# Patient Record
Sex: Female | Born: 1985 | Race: Black or African American | Hispanic: No | Marital: Single | Smoking: Never smoker
Health system: Southern US, Community
[De-identification: ages and names within clinical notes are randomized; demographics above are authoritative.]

## PROBLEM LIST (undated history)

## (undated) ENCOUNTER — Inpatient Hospital Stay (HOSPITAL_COMMUNITY): Payer: Self-pay

## (undated) DIAGNOSIS — I10 Essential (primary) hypertension: Secondary | ICD-10-CM

## (undated) DIAGNOSIS — K219 Gastro-esophageal reflux disease without esophagitis: Secondary | ICD-10-CM

## (undated) DIAGNOSIS — Z5189 Encounter for other specified aftercare: Secondary | ICD-10-CM

## (undated) DIAGNOSIS — I509 Heart failure, unspecified: Secondary | ICD-10-CM

## (undated) DIAGNOSIS — D573 Sickle-cell trait: Secondary | ICD-10-CM

## (undated) HISTORY — DX: Encounter for other specified aftercare: Z51.89

## (undated) HISTORY — DX: Essential (primary) hypertension: I10

---
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
## 2007-04-18 ENCOUNTER — Ambulatory Visit: Payer: Self-pay | Admitting: Family Medicine

## 2007-06-04 ENCOUNTER — Ambulatory Visit: Payer: Self-pay | Admitting: Family Medicine

## 2007-06-04 DIAGNOSIS — M79609 Pain in unspecified limb: Secondary | ICD-10-CM | POA: Insufficient documentation

## 2007-06-06 ENCOUNTER — Encounter (INDEPENDENT_AMBULATORY_CARE_PROVIDER_SITE_OTHER): Payer: Self-pay | Admitting: Emergency Medicine

## 2007-06-06 ENCOUNTER — Emergency Department (HOSPITAL_COMMUNITY): Admission: EM | Admit: 2007-06-06 | Discharge: 2007-06-06 | Payer: Self-pay | Admitting: Emergency Medicine

## 2007-06-06 ENCOUNTER — Ambulatory Visit: Payer: Self-pay | Admitting: Surgery

## 2007-07-16 ENCOUNTER — Ambulatory Visit: Payer: Self-pay | Admitting: Family Medicine

## 2007-07-16 DIAGNOSIS — J069 Acute upper respiratory infection, unspecified: Secondary | ICD-10-CM | POA: Insufficient documentation

## 2007-10-02 ENCOUNTER — Emergency Department (HOSPITAL_COMMUNITY): Admission: EM | Admit: 2007-10-02 | Discharge: 2007-10-02 | Payer: Self-pay | Admitting: Emergency Medicine

## 2007-10-11 ENCOUNTER — Emergency Department (HOSPITAL_COMMUNITY): Admission: EM | Admit: 2007-10-11 | Discharge: 2007-10-11 | Payer: Self-pay | Admitting: Family Medicine

## 2007-11-02 ENCOUNTER — Emergency Department (HOSPITAL_COMMUNITY): Admission: EM | Admit: 2007-11-02 | Discharge: 2007-11-02 | Payer: Self-pay | Admitting: Emergency Medicine

## 2007-12-08 ENCOUNTER — Emergency Department (HOSPITAL_COMMUNITY): Admission: EM | Admit: 2007-12-08 | Discharge: 2007-12-08 | Payer: Self-pay | Admitting: Emergency Medicine

## 2009-02-14 ENCOUNTER — Emergency Department (HOSPITAL_COMMUNITY): Admission: EM | Admit: 2009-02-14 | Discharge: 2009-02-14 | Payer: Self-pay | Admitting: Family Medicine

## 2009-05-07 ENCOUNTER — Inpatient Hospital Stay (HOSPITAL_COMMUNITY): Admission: AD | Admit: 2009-05-07 | Discharge: 2009-05-08 | Payer: Self-pay | Admitting: Obstetrics & Gynecology

## 2009-08-04 ENCOUNTER — Emergency Department (HOSPITAL_COMMUNITY): Admission: EM | Admit: 2009-08-04 | Discharge: 2009-08-04 | Payer: Self-pay | Admitting: Emergency Medicine

## 2009-08-18 ENCOUNTER — Ambulatory Visit: Payer: Self-pay | Admitting: Family Medicine

## 2010-04-28 ENCOUNTER — Encounter (INDEPENDENT_AMBULATORY_CARE_PROVIDER_SITE_OTHER): Payer: Self-pay | Admitting: *Deleted

## 2010-10-25 NOTE — Letter (Signed)
Summary: [name] letter  [place] [facility]  [address] [place], [place] [address]   Phone: [phone]  Fax: [phone]       04/28/2010 MRN: [id number]  Suzanne [name] [address] [place], [place]  [address]  Dear Ms. Suzanne [name] [facility] [place], and [place] announce the retirement of Arta Silence, M.D., from full-time practice at the [facility] office effective March 24, 2010 and his plans of returning part-time.  It is important to Dr. Hetty Ely and to our practice that you understand that [facility] has seven physicians in our office for your health care needs.  We will continue to offer the same exceptional care that you have today.    Dr. Hetty Ely has spoken to many of you about his plans for retirement and returning part-time in the fall.   We will continue to work with you through the transition to schedule appointments for you in the office and meet the high standards that [place] is committed to.   Again, it is with great pleasure that we share the news that Dr. Hetty Ely will return to [facility] in October of 2011 with a reduced schedule.    If you have any questions, or would like to request an appointment with one of our physicians, please call us at [phone] and press the option for Scheduling an appointment.  We take pleasure in providing you with excellent patient care and look forward to seeing you at your next office visit.  Our [facility] Physicians are:  Tillman Abide, M.D. Laurita Quint, M.D. Roxy Manns, M.D. Kerby Nora, M.D. Hannah Beat, M.D. Ruthe Mannan, M.D. We proudly welcomed Raechel Ache, M.D. and Eustaquio Boyden, M.D. to the practice in July/August 2011.  Sincerely,   [facility]

## 2010-10-25 NOTE — Assessment & Plan Note (Signed)
Summary: leg pain/rbh   Vital Signs:  Patient Profile:   25 Years Old Female Height:     57.5 inches Weight:      182 pounds Temp:     98.1 degrees F oral Pulse rate:   71 / minute BP sitting:   130 / 80  (left arm) Cuff size:   regular  Vitals Entered By: Cooper Render (June 04, 2007 8:59 AM)                 Chief Complaint:  R) leg pain and bilateral foot pain.  History of Present Illness: Here due t lR leg painx 1wk and both feet--started working [employer], on feet all day--8hqd/5dwk.  Took IBP 800 with no improvement, has also tried Aleve1--no help.  Has Dr Rodolph Bong orthotics.  Wears sneekers to work.  Going to [employer]--early childhood ed--night classes  Current Allergies: No known allergies      Review of Systems      See HPI   Physical Exam  General:     alert, well-developed, well-nourished, well-hydrated, and overweight-appearing.  NAD Head:     normocephalic, atraumatic, and no abnormalities observed.   Lungs:     normal respiratory effort.   Msk:     normal ROM ankles and toes without pain, no pain with palp soles of feet or ankles Extremities:     no edema Neurologic:     alert & oriented X3, sensation intact to light touch, and gait normal.   Skin:     turgor normal, color normal, and no rashes.   Psych:     normally interactive, good eye contact, and hostile.      Impression & Recommendations:  Problem # 1:  FOOT PAIN, BILATERAL (ICD-729.5) see plan encouraged wt off   Complete Medication List: 1)  Zantac Otc  .... As needed 2)  Zyrtec Allergy 10 Mg Tabs (Cetirizine hcl) .... As needed 3)  Relafen 500  .Marland Kitchen.. 1 two times a day as needed pain with food   Patient Instructions: 1)  discussed getting better orthotics 2)  will refer to Podiatrist if not improved in 1 wk.    Prescriptions: RELAFEN 500 1 two times a day as needed pain with food  #60 x 6   Entered and Authorized by:   Gildardo Griffes FNP   Signed by:    Gildardo Griffes FNP on 06/04/2007   Method used:   Print then Give to Patient   RxID:   [id number]

## 2010-10-25 NOTE — Assessment & Plan Note (Signed)
Summary: CPX/CLE   Vital Signs:  Patient Profile:   25 Years Old Female Height:     57.5 inches Weight:      177 pounds Temp:     98.5 degrees F Pulse rate:   106 / minute BP sitting:   100 / 78  (left arm) Cuff size:   regular  Vitals Entered By: Cooper Render (April 18, 2007 2:24 PM)               Chief Complaint:  college pe and ? up to date w/ immunizations.  History of Present Illness: Here to update immunizations for [employer]--plans t take early childhood development.   No daycare experience, has baby sat a lot.  Reviewed immunization record:  needs, Dtap, Gardasil--information given last visit, no decission made.  Current Allergies (reviewed today): No known allergies  Updated/Current Medications (including changes made in today's visit):  * ZANTAC OTC as needed ZYRTEC ALLERGY 10 MG  TABS (CETIRIZINE HCL) as needed   Past Medical History:    Reviewed history from 04/15/2007 and no changes required:       No hx syncopy/fam hx sudden cardiac death, outside MD, had nl CXR-better w/H2 blocker., SS chest pain after exercise-told GERD by     Review of Systems      See HPI   Physical Exam  General:     alert, well-developed, well-nourished, and well-hydrated.   Head:     normocephalic.   Lungs:     normal respiratory effort.   Neurologic:     alert & oriented X3, sensation intact to light touch, and gait normal.   Skin:     turgor normal and color normal.   Psych:     normally interactive.      Impression & Recommendations:  Problem # 1:  VACCINE AGAINST DTP, DTAP (ICD-V06.1)  Orders: Tdap => 5yrs IM (11914) Admin 1st Vaccine (78295)   Medications Added to Medication List This Visit: 1)  Zyrtec Allergy 10 Mg Tabs (Cetirizine hcl) .... As needed   Patient Instructions: 1)  discussed Gardasil again with mother and patient--handout given--will let us know 2)  Clear liquid diet for the next 24 hours, then slowly progress diet as  tolerated.         Tetanus/Td Vaccine    Vaccine Type: Tdap    Site: left deltoid    Mfr: [employer]    Dose: 0.5 ml    Route: IM    Given by: Cooper Render    Exp. Date: 06/12/2009    Lot #: [id number]    VIS given: 04/05/05 version given April 18, 2007.

## 2010-10-25 NOTE — Assessment & Plan Note (Signed)
Summary: cold/allergies   Vital Signs:  Patient Profile:   24 Years Old Female Height:     57.5 inches Weight:      187 pounds Temp:     98.5 degrees F oral Pulse rate:   171 / minute BP sitting:   147 / 129  (left arm) Cuff size:   large  Vitals Entered By: Cooper Render (July 16, 2007 10:16 AM)                 Chief Complaint:  URI sx, ears hurt, and L) eye red and itchy.  History of Present Illness: fHere due to nasal congestion, L eyeitch and ears full for 1wk.  Cold, no fever, cough started today.  Got sent home last wk from wk due to sx.  Back to work now.  Has not missed school.  NOt smoking.  Taking sudafed and benadryl--not much help.  Sx getting worse.  Current Allergies: No known allergies      Review of Systems      See HPI   Physical Exam  General:     alert, well-developed, well-nourished, and overweight-appearing.  NAD Head:     normocephalic, atraumatic, and no abnormalities observed.   Eyes:     pupils equal, pupils round, and no injection.   Ears:     Tms retracted withsome fluid Nose:     boggy and edematus, sinuses +,- Mouth:     injected with no exudate Lungs:     normal respiratory effort, no accessory muscle use, and normal breath sounds.   Neurologic:     alert & oriented X3, sensation intact to light touch, and gait normal.   Skin:     turgor normal, color normal, and no rashes.   Cervical Nodes:     shotty Psych:     normally interactive.      Impression & Recommendations:  Problem # 1:  URI (ICD-465.9) Assessment: New comfort mseasures, tylenol as needed  see back if worsens Her updated medication list for this problem includes:    Zyrtec Allergy 10 Mg Tabs (Cetirizine hcl) .Marland Kitchen... As needed   Problem # 2:  RHINITIS, ALLERGIC (ICD-477.9) Assessment: New  Her updated medication list for this problem includes:    Zyrtec Allergy 10 Mg Tabs (Cetirizine hcl) .Marland Kitchen... As needed    Nasonex 50 Mcg/act Susp (Mometasone  furoate) .Marland Kitchen... 2 sprays each nostril daily for congestion   Complete Medication List: 1)  Zantac Otc  .... As needed 2)  Zyrtec Allergy 10 Mg Tabs (Cetirizine hcl) .... As needed 3)  Relafen 500  .Marland Kitchen.. 1 two times a day as needed pain with food 4)  Nasonex 50 Mcg/act Susp (Mometasone furoate) .... 2 sprays each nostril daily for congestion 5)  Amoxicillin 500 Mg Caps (Amoxicillin) .... 2 two times a day     Prescriptions: ZYRTEC ALLERGY 10 MG  TABS (CETIRIZINE HCL) as needed  #30 x 6   Entered and Authorized by:   Gildardo Griffes FNP   Signed by:   Gildardo Griffes FNP on 07/16/2007   Method used:   Print then Give to Patient   RxID:   [id number] AMOXICILLIN 500 MG  CAPS (AMOXICILLIN) 2 two times a day  #28 x 0   Entered and Authorized by:   Gildardo Griffes FNP   Signed by:   Gildardo Griffes FNP on 07/16/2007   Method used:   Print then Give to Patient   RxID:  [id number] NASONEX 50 MCG/ACT  SUSP (MOMETASONE FUROATE) 2 sprays each nostril daily for congestion  #1 x 6   Entered and Authorized by:   Gildardo Griffes FNP   Signed by:   Gildardo Griffes FNP on 07/16/2007   Method used:   Print then Give to Patient   RxID:   [id number]  ]

## 2011-01-01 LAB — POCT PREGNANCY, URINE: Preg Test, Ur: NEGATIVE

## 2011-01-01 LAB — URINE CULTURE: Colony Count: 70000

## 2011-01-01 LAB — WET PREP, GENITAL
Clue Cells Wet Prep HPF POC: NONE SEEN
Yeast Wet Prep HPF POC: NONE SEEN

## 2011-01-01 LAB — URINALYSIS, ROUTINE W REFLEX MICROSCOPIC
Bilirubin Urine: NEGATIVE
Glucose, UA: NEGATIVE mg/dL
Hgb urine dipstick: NEGATIVE
Ketones, ur: NEGATIVE mg/dL
Nitrite: NEGATIVE
Protein, ur: NEGATIVE mg/dL
Specific Gravity, Urine: 1.02 (ref 1.005–1.030)
Urobilinogen, UA: 0.2 mg/dL (ref 0.0–1.0)
pH: 5.5 (ref 5.0–8.0)

## 2011-01-01 LAB — URINE MICROSCOPIC-ADD ON

## 2011-01-01 LAB — GC/CHLAMYDIA PROBE AMP, GENITAL
Chlamydia, DNA Probe: POSITIVE — AB
GC Probe Amp, Genital: NEGATIVE

## 2011-01-03 LAB — POCT RAPID STREP A (OFFICE): Streptococcus, Group A Screen (Direct): POSITIVE — AB

## 2011-02-07 ENCOUNTER — Inpatient Hospital Stay (INDEPENDENT_AMBULATORY_CARE_PROVIDER_SITE_OTHER)
Admission: RE | Admit: 2011-02-07 | Discharge: 2011-02-07 | Disposition: A | Discharge status: Home / Self Care | Payer: Self-pay | Source: Ambulatory Visit | Attending: Family Medicine | Admitting: Family Medicine

## 2011-02-07 DIAGNOSIS — H109 Unspecified conjunctivitis: Secondary | ICD-10-CM

## 2011-06-16 LAB — RAPID STREP SCREEN (MED CTR MEBANE ONLY): Streptococcus, Group A Screen (Direct): NEGATIVE

## 2012-01-04 ENCOUNTER — Ambulatory Visit: Payer: Self-pay | Admitting: Family Medicine

## 2012-03-27 ENCOUNTER — Encounter (HOSPITAL_COMMUNITY): Payer: Self-pay

## 2012-03-27 ENCOUNTER — Emergency Department (INDEPENDENT_AMBULATORY_CARE_PROVIDER_SITE_OTHER)
Admission: EM | Admit: 2012-03-27 | Discharge: 2012-03-27 | Disposition: A | Discharge status: Home / Self Care | Payer: Self-pay | Source: Home / Self Care | Attending: Family Medicine | Admitting: Family Medicine

## 2012-03-27 DIAGNOSIS — K529 Noninfective gastroenteritis and colitis, unspecified: Secondary | ICD-10-CM

## 2012-03-27 DIAGNOSIS — K5289 Other specified noninfective gastroenteritis and colitis: Secondary | ICD-10-CM

## 2012-03-27 HISTORY — DX: Sickle-cell trait: D57.3

## 2012-03-27 HISTORY — DX: Gastro-esophageal reflux disease without esophagitis: K21.9

## 2012-03-27 MED ORDER — PROMETHAZINE HCL 12.5 MG PO TABS: 12.5000 mg | ORAL_TABLET | Freq: Three times a day (TID) | ORAL | Status: DC | PRN

## 2012-03-27 MED ORDER — LOPERAMIDE HCL 2 MG PO CAPS: 2.0000 mg | ORAL_CAPSULE | Freq: Four times a day (QID) | ORAL | Status: AC | PRN

## 2012-03-27 MED ORDER — ACETAMINOPHEN-CODEINE #3 300-30 MG PO TABS: 1.0000 | ORAL_TABLET | Freq: Four times a day (QID) | ORAL | Status: AC | PRN

## 2012-03-27 NOTE — ED Notes (Signed)
Initial triage was entered on the wrong pts chart, information deleted and then updated.

## 2012-03-27 NOTE — ED Notes (Signed)
C/o generalized abdominal pain, diarrhea and nausea since Monday.  Denies vomiting or fever.  States she was on beach trip with her aunt this weekend and she had these same sx then.  States she has called out from work and needs a work note to return.

## 2012-03-27 NOTE — ED Provider Notes (Signed)
History     CSN: [id number]  Arrival date & time 03/27/12  1116   First MD Initiated Contact with Patient 03/27/12 1133      Chief Complaint  Patient presents with  . Abdominal Pain  . Diarrhea    (Consider location/radiation/quality/duration/timing/severity/associated sxs/prior treatment) HPI Comments: 25 year old female nonsmoker no significant past medical history other than sickle cell trait. Here complaining of generalized abdominal pain, nausea and diarrhea for 2 days. Reports more than 10 runny stools this morning. Denies blood in her stools. No fever or chills. No tenesmus. Abdominal pain is colicky and just before the diarrhea episodes. Had a sick contact ( aunt that traveled with patient to the beach recently had similar symptoms that's already resolved). No rash. No jaundice.  Patient is a 26 y.o. female presenting with diarrhea.  Diarrhea The primary symptoms include abdominal pain, nausea and diarrhea. Primary symptoms do not include fever, vomiting, dysuria or rash.  The illness does not include chills.    Past Medical History  Diagnosis Date  . Sickle cell trait   . Acid reflux     History reviewed. No pertinent past surgical history.  No family history on file.  History  Substance Use Topics  . Smoking status: Never Smoker   . Smokeless tobacco: Not on file  . Alcohol Use: No    OB History    Grav Para Term Preterm Abortions TAB SAB Ect Mult Living                  Review of Systems  Constitutional: Positive for appetite change. Negative for fever and chills.  HENT: Negative for congestion.   Respiratory: Negative for cough and shortness of breath.   Gastrointestinal: Positive for nausea, abdominal pain and diarrhea. Negative for vomiting.  Genitourinary: Negative for dysuria, vaginal bleeding, vaginal discharge and pelvic pain.  Skin: Negative for rash.  Neurological: Negative for dizziness and headaches.    Allergies  Review of patient's  allergies indicates no known allergies.  Home Medications   Current Outpatient Rx  Name Route Sig Dispense Refill  . ACETAMINOPHEN-CODEINE #3 300-30 MG PO TABS Oral Take 1-2 tablets by mouth every 6 (six) hours as needed for pain. 15 tablet 0  . LOPERAMIDE HCL 2 MG PO CAPS Oral Take 1 capsule (2 mg total) by mouth 4 (four) times daily as needed for diarrhea or loose stools. 12 capsule 0  . PROMETHAZINE HCL 12.5 MG PO TABS Oral Take 1 tablet (12.5 mg total) by mouth every 8 (eight) hours as needed for nausea. 15 tablet 0    BP 120/77  Pulse 80  Temp 98.1 F (36.7 C) (Oral)  Resp 16  SpO2 98%  LMP 03/15/2012  Breastfeeding? No  Physical Exam  Nursing note and vitals reviewed. Constitutional: She is oriented to person, place, and time. She appears well-developed and well-nourished. No distress.  HENT:  Head: Normocephalic and atraumatic.  Mouth/Throat: Oropharynx is clear and moist. No oropharyngeal exudate.  Eyes: Conjunctivae are normal. No scleral icterus.  Neck: Neck supple. No thyromegaly present.  Cardiovascular: Normal rate, regular rhythm and normal heart sounds.   Pulmonary/Chest: Effort normal and breath sounds normal. She has no wheezes. She has no rales.  Abdominal: Soft. Bowel sounds are normal. She exhibits no distension and no mass. There is no tenderness. There is no rebound and no guarding.  Lymphadenopathy:    She has no cervical adenopathy.  Neurological: She is alert and oriented to person, place, and time.  Skin: No rash noted.    ED Course  Procedures (including critical care time)  Labs Reviewed - No data to display No results found.   1. Gastroenteritis       MDM  Impress viral gastroenteritis. No clinical dehydration. Tolerating fluids with no vomiting. Treated symptomatically with Phenergan, loperamide and Tylenol No. 3. Asked to return if worsening symptoms like persistent worsening abdominal pain, new onset of fever or not keeping fluids  down.        Sharin Grave, MD 03/28/12 [phone]

## 2012-12-10 ENCOUNTER — Emergency Department (INDEPENDENT_AMBULATORY_CARE_PROVIDER_SITE_OTHER)
Admission: EM | Admit: 2012-12-10 | Discharge: 2012-12-10 | Disposition: A | Discharge status: Home / Self Care | Payer: Self-pay | Source: Home / Self Care

## 2012-12-10 ENCOUNTER — Encounter (HOSPITAL_COMMUNITY): Payer: Self-pay | Admitting: *Deleted

## 2012-12-10 DIAGNOSIS — J45909 Unspecified asthma, uncomplicated: Secondary | ICD-10-CM

## 2012-12-10 DIAGNOSIS — J069 Acute upper respiratory infection, unspecified: Secondary | ICD-10-CM

## 2012-12-10 MED ORDER — ALBUTEROL SULFATE HFA 108 (90 BASE) MCG/ACT IN AERS: 1.0000 | INHALATION_SPRAY | Freq: Four times a day (QID) | RESPIRATORY_TRACT | Status: DC | PRN

## 2012-12-10 MED ORDER — PHENYLEPHRINE-CHLORPHEN-DM 10-4-12.5 MG/5ML PO LIQD: 5.0000 mL | ORAL | Status: DC | PRN

## 2012-12-10 NOTE — ED Provider Notes (Signed)
Medical screening examination/treatment/procedure(s) were performed by resident physician or non-physician practitioner and as supervising physician I was immediately available for consultation/collaboration.   Barkley Bruns MD.   Linna Hoff, MD 12/10/12 1154

## 2012-12-10 NOTE — ED Notes (Signed)
Pt  Reports  Symptoms  Of  ccough  Congested   sorethroat   X  4  Days  Pt  Reports     Symptoms  Not  releived  By otc  meds

## 2012-12-10 NOTE — ED Provider Notes (Signed)
History     CSN: [id number]  Arrival date & time 12/10/12  1001   First MD Initiated Contact with Patient 12/10/12 1014      Chief Complaint  Patient presents with  . Cough    (Consider location/radiation/quality/duration/timing/severity/associated sxs/prior treatment) HPI Comments: 27 year old female presents with PND, minor scratchy throat, sinus changes in voice, cough and wheeze. Denies fever or earache. Denies history of asthma or chronic bronchitis.   Past Medical History  Diagnosis Date  . Sickle cell trait   . Acid reflux     History reviewed. No pertinent past surgical history.  No family history on file.  History  Substance Use Topics  . Smoking status: Never Smoker   . Smokeless tobacco: Not on file  . Alcohol Use: No    OB History   Grav Para Term Preterm Abortions TAB SAB Ect Mult Living                  Review of Systems  Constitutional: Positive for activity change. Negative for fever, chills, appetite change and fatigue.  HENT: Positive for congestion, rhinorrhea and postnasal drip. Negative for ear pain, facial swelling, neck pain, neck stiffness and ear discharge.   Eyes: Negative.   Respiratory: Positive for cough and wheezing. Negative for shortness of breath.   Cardiovascular: Negative.   Gastrointestinal: Negative.   Genitourinary: Negative.   Musculoskeletal: Negative.   Skin: Negative for pallor and rash.  Neurological: Negative.     Allergies  Review of patient's allergies indicates no known allergies.  Home Medications   Current Outpatient Rx  Name  Route  Sig  Dispense  Refill  . albuterol (PROVENTIL HFA;VENTOLIN HFA) 108 (90 BASE) MCG/ACT inhaler   Inhalation   Inhale 1-2 puffs into the lungs every 6 (six) hours as needed for wheezing.   1 Inhaler   0   . Phenylephrine-Chlorphen-DM 06-29-11.5 MG/5ML LIQD   Oral   Take 5 mLs by mouth every 4 (four) hours as needed.   120 mL   0   . EXPIRED: promethazine (PHENERGAN)  12.5 MG tablet   Oral   Take 1 tablet (12.5 mg total) by mouth every 8 (eight) hours as needed for nausea.   15 tablet   0     BP 128/70  Pulse 72  Temp(Src) 98.6 F (37 C) (Oral)  Resp 20  SpO2 100%  LMP 12/03/2012  Physical Exam  Constitutional: She is oriented to person, place, and time. She appears well-developed and well-nourished. No distress.  HENT:  Head: Normocephalic.  Right Ear: External ear normal.  Left Ear: External ear normal.  Postpharyngeal erythema, no exudates. Bilateral TMs are normal  Eyes: Conjunctivae and EOM are normal.  Neck: Normal range of motion. Neck supple.  Cardiovascular: Normal rate and regular rhythm.   Murmur heard. Pulmonary/Chest: Effort normal. No respiratory distress. She has no rales.  Musculoskeletal: Normal range of motion.  Lymphadenopathy:    She has no cervical adenopathy.  Neurological: She is alert and oriented to person, place, and time. No cranial nerve deficit.  Skin: Skin is warm and dry.  Psychiatric: She has a normal mood and affect.    ED Course  Procedures (including critical care time)  Labs Reviewed - No data to display No results found.   1. URI (upper respiratory infection)   2. RAD (reactive airway disease) with wheezing       MDM     Patient is discharged in stable condition. She has  minor wheezes and PND or the primary reasons for her cough. Will prescribe albuterol HFA one to 2 puffs every 4-6 hours when necessary cough and wheeze. Norell CS 1 teaspoon every 4 hours when necessary PND, cough and congestion. Recheck promptly for any new symptoms problems or worsening     Hayden Rasmussen, NP 12/10/12 1041

## 2013-02-13 ENCOUNTER — Encounter (HOSPITAL_COMMUNITY): Payer: Self-pay | Admitting: *Deleted

## 2013-02-13 ENCOUNTER — Inpatient Hospital Stay (HOSPITAL_COMMUNITY)
Admission: AD | Admit: 2013-02-13 | Discharge: 2013-02-13 | Disposition: A | Discharge status: Home / Self Care | Payer: Self-pay | Source: Ambulatory Visit | Attending: Obstetrics & Gynecology | Admitting: Obstetrics & Gynecology

## 2013-02-13 DIAGNOSIS — O210 Mild hyperemesis gravidarum: Secondary | ICD-10-CM | POA: Insufficient documentation

## 2013-02-13 DIAGNOSIS — O219 Vomiting of pregnancy, unspecified: Secondary | ICD-10-CM

## 2013-02-13 LAB — POCT PREGNANCY, URINE: Preg Test, Ur: POSITIVE — AB

## 2013-02-13 LAB — URINALYSIS, ROUTINE W REFLEX MICROSCOPIC
Bilirubin Urine: NEGATIVE
Glucose, UA: NEGATIVE mg/dL
Ketones, ur: NEGATIVE mg/dL
Nitrite: NEGATIVE
Protein, ur: NEGATIVE mg/dL
Specific Gravity, Urine: >1.03 — ABNORMAL HIGH (ref 1.005–1.030)
Urobilinogen, UA: 1 mg/dL (ref 0.0–1.0)
pH: 5.5 (ref 5.0–8.0)

## 2013-02-13 LAB — URINE MICROSCOPIC-ADD ON

## 2013-02-13 MED ORDER — PROMETHAZINE HCL 25 MG PO TABS: 25.0000 mg | ORAL_TABLET | Freq: Three times a day (TID) | ORAL | Status: DC | PRN

## 2013-02-13 NOTE — MAU Note (Signed)
Pt states she some spotting last week. Last regular menstrual period was 01/01/2013.Marland Kitchen Pt complaints of some nausea and vomiting

## 2013-02-13 NOTE — MAU Provider Note (Signed)
History     CSN: [id number]  Arrival date and time: 02/13/13 1455   None     Chief Complaint  Patient presents with  . Possible Pregnancy   HPI 27 y.o. G1P0 at 6.[redacted]weeks EGA with n/v. Patient's last menstrual period was 01/01/2013. Denies pain or bleeding.    Past Medical History  Diagnosis Date  . Sickle cell trait   . Acid reflux     History reviewed. No pertinent past surgical history.  Family History  Problem Relation Age of Onset  . Diabetes Mother   . Arthritis Mother   . Hypertension Mother   . Diabetes Maternal Grandmother   . Hypertension Maternal Grandmother     History  Substance Use Topics  . Smoking status: Never Smoker   . Smokeless tobacco: Not on file  . Alcohol Use: No    Allergies: No Known Allergies  Prescriptions prior to admission  Medication Sig Dispense Refill  . acetaminophen (TYLENOL) 500 MG tablet Take 500 mg by mouth every 6 (six) hours as needed for pain (headache).      Marland Kitchen ibuprofen (ADVIL,MOTRIN) 800 MG tablet Take 800 mg by mouth every 8 (eight) hours as needed for pain (headache and pain in foot).      . [DISCONTINUED] albuterol (PROVENTIL HFA;VENTOLIN HFA) 108 (90 BASE) MCG/ACT inhaler Inhale 1-2 puffs into the lungs every 6 (six) hours as needed for wheezing.  1 Inhaler  0  . [DISCONTINUED] Phenylephrine-Chlorphen-DM 06-29-11.5 MG/5ML LIQD Take 5 mLs by mouth every 4 (four) hours as needed.  120 mL  0  . [DISCONTINUED] promethazine (PHENERGAN) 12.5 MG tablet Take 1 tablet (12.5 mg total) by mouth every 8 (eight) hours as needed for nausea.  15 tablet  0    Review of Systems  Constitutional: Negative.   Respiratory: Negative.   Cardiovascular: Negative.   Gastrointestinal: Positive for nausea and vomiting. Negative for abdominal pain, diarrhea and constipation.  Genitourinary: Negative for dysuria, urgency, frequency, hematuria and flank pain.       Negative for vaginal bleeding, vaginal discharge, dyspareunia  Musculoskeletal:  Negative.   Neurological: Negative.   Psychiatric/Behavioral: Negative.    Physical Exam   Blood pressure 120/79, pulse 98, temperature 98.3 F (36.8 C), temperature source Oral, resp. rate 18, last menstrual period 01/01/2013.  Physical Exam  Nursing note and vitals reviewed. Constitutional: She is oriented to person, place, and time. She appears well-developed and well-nourished. No distress.  Cardiovascular: Normal rate.   Respiratory: Effort normal.  GI: Soft. There is no tenderness.  Neurological: She is alert and oriented to person, place, and time.  Skin: Skin is warm and dry.  Psychiatric: She has a normal mood and affect.    MAU Course  Procedures Results for orders placed during the hospital encounter of 02/13/13 (from the past 24 hour(s))  URINALYSIS, ROUTINE W REFLEX MICROSCOPIC     Status: Abnormal   Collection Time    02/13/13  3:10 PM      Result Value Range   Color, Urine YELLOW  YELLOW   APPearance CLEAR  CLEAR   Specific Gravity, Urine >1.030 (*) 1.005 - 1.030   pH 5.5  5.0 - 8.0   Glucose, UA NEGATIVE  NEGATIVE mg/dL   Hgb urine dipstick TRACE (*) NEGATIVE   Bilirubin Urine NEGATIVE  NEGATIVE   Ketones, ur NEGATIVE  NEGATIVE mg/dL   Protein, ur NEGATIVE  NEGATIVE mg/dL   Urobilinogen, UA 1.0  0.0 - 1.0 mg/dL   Nitrite NEGATIVE  NEGATIVE   Leukocytes, UA SMALL (*) NEGATIVE  URINE MICROSCOPIC-ADD ON     Status: Abnormal   Collection Time    02/13/13  3:10 PM      Result Value Range   Squamous Epithelial / LPF MANY (*) RARE   WBC, UA 3-6  <3 WBC/hpf   RBC / HPF 0-2  <3 RBC/hpf  POCT PREGNANCY, URINE     Status: Abnormal   Collection Time    02/13/13  3:15 PM      Result Value Range   Preg Test, Ur POSITIVE (*) NEGATIVE     Assessment and Plan   1. Nausea and vomiting in pregnancy prior to [redacted] weeks gestation   Meds as below, may also try Unisom and B6, pregnancy verification and list of providers given    Medication List    STOP taking  these medications       albuterol 108 (90 BASE) MCG/ACT inhaler  Commonly known as:  PROVENTIL HFA;VENTOLIN HFA     Phenylephrine-Chlorphen-DM 06-29-11.5 MG/5ML Liqd      TAKE these medications       acetaminophen 500 MG tablet  Commonly known as:  TYLENOL  Take 500 mg by mouth every 6 (six) hours as needed for pain (headache).     ibuprofen 800 MG tablet  Commonly known as:  ADVIL,MOTRIN  Take 800 mg by mouth every 8 (eight) hours as needed for pain (headache and pain in foot).     promethazine 25 MG tablet  Commonly known as:  PHENERGAN  Take 1 tablet (25 mg total) by mouth every 8 (eight) hours as needed for nausea.            Follow-up Information   Schedule an appointment as soon as possible for a visit with provider of your choice. (for prenatal care)         Lissett Favorite 02/13/2013, 4:23 PM

## 2013-02-13 NOTE — MAU Provider Note (Signed)
Attestation of Attending Supervision of Advanced Practitioner (PA/CNM/NP): Evaluation and management procedures were performed by the Advanced Practitioner under my supervision and collaboration.  I have reviewed the Advanced Practitioner's note and chart, and I agree with the management and plan.  Shauntelle Jamerson, MD, FACOG Attending Obstetrician & Gynecologist Faculty Practice, [facility]  

## 2013-04-10 ENCOUNTER — Ambulatory Visit (INDEPENDENT_AMBULATORY_CARE_PROVIDER_SITE_OTHER): Payer: Medicaid Other | Admitting: Obstetrics

## 2013-04-10 ENCOUNTER — Encounter: Payer: Self-pay | Admitting: Obstetrics

## 2013-04-10 VITALS — BP 128/80 | Temp 97.7°F | Wt 218.0 lb

## 2013-04-10 DIAGNOSIS — Z3400 Encounter for supervision of normal first pregnancy, unspecified trimester: Secondary | ICD-10-CM

## 2013-04-10 DIAGNOSIS — O99891 Other specified diseases and conditions complicating pregnancy: Secondary | ICD-10-CM

## 2013-04-10 DIAGNOSIS — D573 Sickle-cell trait: Secondary | ICD-10-CM

## 2013-04-10 DIAGNOSIS — Z3402 Encounter for supervision of normal first pregnancy, second trimester: Secondary | ICD-10-CM

## 2013-04-10 DIAGNOSIS — K117 Disturbances of salivary secretion: Secondary | ICD-10-CM

## 2013-04-10 LAB — POCT URINALYSIS DIPSTICK
Bilirubin, UA: NEGATIVE
Glucose, UA: NEGATIVE
Ketones, UA: NEGATIVE
Nitrite, UA: NEGATIVE
Spec Grav, UA: 1.02
Urobilinogen, UA: NEGATIVE
pH, UA: 5

## 2013-04-10 MED ORDER — GLYCOPYRROLATE 1 MG PO TABS: 1.0000 mg | ORAL_TABLET | Freq: Three times a day (TID) | ORAL | Status: DC

## 2013-04-10 MED ORDER — OMEPRAZOLE 20 MG PO CPDR: 20.0000 mg | DELAYED_RELEASE_CAPSULE | Freq: Every day | ORAL | Status: DC

## 2013-04-10 NOTE — Progress Notes (Signed)
   Subjective:    Suzanne [name] is a G1P0 [redacted]w[redacted]d being seen today for her first obstetrical visit.  Father of baby involved. Patient states unplanned pregnancy. Her obstetrical history is significant for obesity. Patient is undecided on  breast feeding. Pregnancy history fully reviewed.  Patient reports headache, heartburn, nausea, no bleeding and excessive spitting.  Filed Vitals:   04/10/13 0924  BP: 128/80  Temp: 97.7 F (36.5 C)  Weight: 218 lb (98.884 kg)    HISTORY: OB History   Grav Para Term Preterm Abortions TAB SAB Ect Mult Living   1              # Outc Date GA Lbr Len/2nd Wgt Sex Del Anes PTL Lv   1 CUR              Past Medical History  Diagnosis Date  . Sickle cell trait   . Acid reflux    No past surgical history on file. Family History  Problem Relation Age of Onset  . Diabetes Mother   . Arthritis Mother   . Hypertension Mother   . Diabetes Maternal Grandmother   . Hypertension Maternal Grandmother      Exam    Uterus:     Pelvic Exam:    Perineum: No Hemorrhoids, Normal Perineum   Vulva: normal   Vagina:  normal mucosa   pH: none   Cervix: no lesions   Adnexa: no mass, fullness, tenderness   Bony Pelvis: gynecoid  System: Breast:  normal appearance, no masses or tenderness   Skin: normal coloration and turgor, no rashes    Neurologic: normal   Extremities: normal strength, tone, and muscle mass   HEENT PERRLA   Mouth/Teeth mucous membranes moist, pharynx normal without lesions   Neck supple   Cardiovascular: regular rate and rhythm   Respiratory:  appears well, vitals normal, no respiratory distress, acyanotic, normal RR, ear and throat exam is normal, neck free of mass or lymphadenopathy, chest clear, no wheezing, crepitations, rhonchi, normal symmetric air entry   Abdomen: normal findings: soft, non-tender   Urinary: urethral meatus normal      Assessment:    Pregnancy: G1P0 Patient Active Problem List   Diagnosis Date Noted   . URI 07/16/2007  . FOOT PAIN, BILATERAL 06/04/2007  . SICKLE CELL TRAIT 11/22/2006  . RHINITIS, ALLERGIC 11/22/2006        Plan:     Initial labs drawn. Prenatal vitamins. Problem list reviewed and updated. Genetic Screening discussed Quad Screen: requested.  Ultrasound discussed; fetal survey: requested.  Follow up in 2 weeks. 50% of 20 min visit spent on counseling and coordination of care.  All questions answered.   Glendell Docker 04/10/2013

## 2013-04-11 DIAGNOSIS — K117 Disturbances of salivary secretion: Secondary | ICD-10-CM

## 2013-04-11 DIAGNOSIS — D573 Sickle-cell trait: Secondary | ICD-10-CM | POA: Insufficient documentation

## 2013-04-11 HISTORY — DX: Disturbances of salivary secretion: K11.7

## 2013-04-11 LAB — WET PREP BY MOLECULAR PROBE
Candida species: POSITIVE — AB
Gardnerella vaginalis: POSITIVE — AB
Trichomonas vaginosis: NEGATIVE

## 2013-04-11 LAB — OBSTETRIC PANEL
Antibody Screen: NEGATIVE
Basophils Absolute: 0 10*3/uL (ref 0.0–0.1)
Basophils Relative: 0 % (ref 0–1)
Eosinophils Absolute: 0.2 10*3/uL (ref 0.0–0.7)
Eosinophils Relative: 2 % (ref 0–5)
HCT: 34.2 % — ABNORMAL LOW (ref 36.0–46.0)
Hemoglobin: 11.1 g/dL — ABNORMAL LOW (ref 12.0–15.0)
Hepatitis B Surface Ag: NEGATIVE
Lymphocytes Relative: 21 % (ref 12–46)
Lymphs Abs: 2.3 10*3/uL (ref 0.7–4.0)
MCH: 23.4 pg — ABNORMAL LOW (ref 26.0–34.0)
MCHC: 32.5 g/dL (ref 30.0–36.0)
MCV: 72 fL — ABNORMAL LOW (ref 78.0–100.0)
Monocytes Absolute: 0.8 10*3/uL (ref 0.1–1.0)
Monocytes Relative: 7 % (ref 3–12)
Neutro Abs: 7.4 10*3/uL (ref 1.7–7.7)
Neutrophils Relative %: 70 % (ref 43–77)
Platelets: 333 10*3/uL (ref 150–400)
RBC: 4.75 MIL/uL (ref 3.87–5.11)
RDW: 16.8 % — ABNORMAL HIGH (ref 11.5–15.5)
Rh Type: POSITIVE
Rubella: 1.95 Index — ABNORMAL HIGH (ref <0.90)
WBC: 10.6 10*3/uL — ABNORMAL HIGH (ref 4.0–10.5)

## 2013-04-11 LAB — PAP IG W/ RFLX HPV ASCU

## 2013-04-11 LAB — VARICELLA ZOSTER ANTIBODY, IGG: Varicella IgG: <10 Index (ref <135.00)

## 2013-04-11 LAB — VITAMIN D 25 HYDROXY (VIT D DEFICIENCY, FRACTURES): Vit D, 25-Hydroxy: 15 ng/mL — ABNORMAL LOW (ref 30–89)

## 2013-04-11 LAB — GC/CHLAMYDIA PROBE AMP
CT Probe RNA: POSITIVE — AB
GC Probe RNA: NEGATIVE

## 2013-04-11 LAB — HIV ANTIBODY (ROUTINE TESTING W REFLEX): HIV: NONREACTIVE

## 2013-04-12 LAB — CULTURE, OB URINE
Colony Count: NO GROWTH
Organism ID, Bacteria: NO GROWTH

## 2013-04-14 LAB — HEMOGLOBINOPATHY EVALUATION
Hemoglobin Other: 0 %
Hgb A2 Quant: 3.6 % — ABNORMAL HIGH (ref 2.2–3.2)
Hgb A: 58.6 % — ABNORMAL LOW (ref 96.8–97.8)
Hgb F Quant: 1.2 % (ref 0.0–2.0)
Hgb S Quant: 36.6 % — ABNORMAL HIGH

## 2013-04-17 ENCOUNTER — Other Ambulatory Visit: Payer: Self-pay | Admitting: *Deleted

## 2013-04-17 DIAGNOSIS — N760 Acute vaginitis: Secondary | ICD-10-CM

## 2013-04-17 DIAGNOSIS — B379 Candidiasis, unspecified: Secondary | ICD-10-CM

## 2013-04-17 DIAGNOSIS — B9689 Other specified bacterial agents as the cause of diseases classified elsewhere: Secondary | ICD-10-CM

## 2013-04-17 MED ORDER — METRONIDAZOLE 500 MG PO TABS
500.0000 mg | ORAL_TABLET | Freq: Two times a day (BID) | ORAL | Status: DC
Start: 2013-04-17 — End: 2013-08-13

## 2013-04-17 MED ORDER — FLUCONAZOLE 150 MG PO TABS: 150.0000 mg | ORAL_TABLET | Freq: Once | ORAL | Status: DC

## 2013-04-18 ENCOUNTER — Other Ambulatory Visit: Payer: Self-pay | Admitting: *Deleted

## 2013-04-18 DIAGNOSIS — A749 Chlamydial infection, unspecified: Secondary | ICD-10-CM

## 2013-04-18 MED ORDER — AZITHROMYCIN 500 MG PO TABS: 1000.0000 mg | ORAL_TABLET | Freq: Every day | ORAL | Status: AC

## 2013-04-18 MED ORDER — CEFIXIME 400 MG PO TABS: 400.0000 mg | ORAL_TABLET | Freq: Every day | ORAL | Status: AC

## 2013-04-18 NOTE — Progress Notes (Signed)
Pt positive for Chlamydia. Per Dr. Clearance Coots, treat with Azithromycin 1000 mg p.o. And Suprax 400 mg p.o. One time stat dose.

## 2013-04-23 ENCOUNTER — Encounter: Payer: Self-pay | Admitting: Obstetrics & Gynecology

## 2013-04-24 ENCOUNTER — Encounter: Payer: Self-pay | Admitting: Obstetrics

## 2013-04-24 ENCOUNTER — Ambulatory Visit (INDEPENDENT_AMBULATORY_CARE_PROVIDER_SITE_OTHER): Payer: Medicaid Other | Admitting: Obstetrics

## 2013-04-24 VITALS — BP 120/87 | Temp 98.7°F | Wt 220.0 lb

## 2013-04-24 DIAGNOSIS — Z3400 Encounter for supervision of normal first pregnancy, unspecified trimester: Secondary | ICD-10-CM

## 2013-04-24 DIAGNOSIS — Z3402 Encounter for supervision of normal first pregnancy, second trimester: Secondary | ICD-10-CM

## 2013-04-24 DIAGNOSIS — Z369 Encounter for antenatal screening, unspecified: Secondary | ICD-10-CM

## 2013-04-24 LAB — POCT URINALYSIS DIPSTICK
Bilirubin, UA: NEGATIVE
Blood, UA: NEGATIVE
Glucose, UA: NEGATIVE
Ketones, UA: NEGATIVE
Nitrite, UA: NEGATIVE
Spec Grav, UA: 1.015
Urobilinogen, UA: NEGATIVE
pH, UA: 5

## 2013-04-24 MED ORDER — VITAFOL ULTRA 29-0.6-0.4-200 MG PO CAPS: 1.0000 | ORAL_CAPSULE | Freq: Every day | ORAL | Status: DC

## 2013-04-24 NOTE — Progress Notes (Signed)
P- 109 Round ligament pain

## 2013-04-28 LAB — AFP, QUAD SCREEN
AFP: 39.2 IU/mL
Age Alone: 1:1050 {titer}
Curr Gest Age: 16.1 wks.days
Down Syndrome Scr Risk Est: <1:38500 {titer}
HCG, Total: 12379 m[IU]/mL
INH: 116.8 pg/mL
Interpretation-AFP: NEGATIVE
MoM for AFP: 1.47
MoM for INH: 0.75
MoM for hCG: 0.62
Open Spina bifida: NEGATIVE
Osb Risk: 1:6140 {titer}
Tri 18 Scr Risk Est: NEGATIVE
Trisomy 18 (Edward) Syndrome Interp.: 1:71800 {titer}
uE3 Mom: 1.49
uE3 Value: 0.7 ng/mL

## 2013-05-07 ENCOUNTER — Other Ambulatory Visit: Payer: Self-pay | Admitting: Obstetrics

## 2013-05-07 DIAGNOSIS — D573 Sickle-cell trait: Secondary | ICD-10-CM

## 2013-05-08 ENCOUNTER — Ambulatory Visit (HOSPITAL_COMMUNITY)
Admission: RE | Admit: 2013-05-08 | Discharge: 2013-05-08 | Disposition: A | Discharge status: Home / Self Care | Payer: Medicaid Other | Source: Ambulatory Visit | Attending: Obstetrics | Admitting: Obstetrics

## 2013-05-08 ENCOUNTER — Encounter (HOSPITAL_COMMUNITY): Payer: Self-pay

## 2013-05-08 DIAGNOSIS — D571 Sickle-cell disease without crisis: Secondary | ICD-10-CM | POA: Insufficient documentation

## 2013-05-08 DIAGNOSIS — D573 Sickle-cell trait: Secondary | ICD-10-CM

## 2013-05-08 DIAGNOSIS — Z363 Encounter for antenatal screening for malformations: Secondary | ICD-10-CM | POA: Insufficient documentation

## 2013-05-08 DIAGNOSIS — O99019 Anemia complicating pregnancy, unspecified trimester: Secondary | ICD-10-CM | POA: Insufficient documentation

## 2013-05-08 DIAGNOSIS — O358XX0 Maternal care for other (suspected) fetal abnormality and damage, not applicable or unspecified: Secondary | ICD-10-CM | POA: Insufficient documentation

## 2013-05-08 DIAGNOSIS — Z1389 Encounter for screening for other disorder: Secondary | ICD-10-CM | POA: Insufficient documentation

## 2013-05-08 NOTE — Progress Notes (Signed)
Genetic Counseling  High-Risk Gestation Note  Appointment Date:  05/08/2013 Referred By: Brock Bad, MD Date of Birth:  July 18, 1986    Pregnancy History: G1P0 Estimated Date of Delivery: 10/08/13 Estimated Gestational Age: [redacted]w[redacted]d Attending: Particia Nearing, MD   Suzanne [name] was seen for genetic counseling given that routine screening through her OB office indicated that she has sickle cell trait.   Suzanne [name] was offered testing for sickle cell anemia (SCA) because this condition occurs more commonly among individuals with African-American ancestry. 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 The patient reported that she was aware of having sickle cell trait given that this was detected at birth for her.  Ms. Suzanne [name] reported that her father has sickle cell trait. Additionally, she reported that her paternal half-uncle and his daughter (the patient's paternal half first cousin) both have sickle cell anemia.   We discussed that SCA affects the shape and function of the red blood cell by producing abnormal hemoglobin. She was counseled that hemoglobin is a protein in the RBCs that carries oxygen to the body's organs. Individuals who have SCA have a changes within the genes that codes for hemoglobin. This couple was counseled that SCA is inherited in an autosomal recessive manner, and occurs when both copies of the hemoglobin gene are changed and produce an abnormal hemoglobin S, denoted as hemoglobin SS. Typically, one abnormal gene for the production of hemoglobin S is inherited from each parent. A carrier of SCA has one altered copy of the gene for hemoglobin and one typical working copy (hemoglobin AS). Carriers of recessive conditions typically do not have symptoms related to the condition because they still have one functioning copy of the gene, and thus some production of the typical protein coded  for by that gene. We reviewed that when both parents have sickle cell trait, each pregnancy together has a 1 in 4 (25%) chance to inherit sickle cell anemia. Each pregnancy together also has a 1 in 2 (50%) chance to inherit sickle cell trait and a 1 in 4 (25%) chance to inherit hemoglobin AA.   Given the recessive inheritance, we discussed the importance of understanding the carrier status of the father of the pregnancy in order to accurately predict the risk of a hemoglobinopathy in the fetus. The patient reported that the father of the pregnancy has had testing for SCA in the past and that he does NOT have SCT.  Medical records were not available to verify the reported information at this time. We discussed that other hemoglobin variants (hemoglobin C), when combined with hemoglobin S, can cause a medically significant hemoglobinopathy. We discussed the option of repeat testing (hemoglobin electrophoresis) to determine whether he has any hemoglobin variant, including SCT. The father of the pregnancy was not present with the patient at today's visit. If he desires repeat testing, we can facilitate this. Additionally, we discussed that sickle cell screening is provided as a free service through [facility]. They understand that an accurate risk assessment cannot be performed without further information. However, assuming that the father of the pregnancy does not have sickle cell trait or any other hemoglobin variant, the fetus would not be expected to have an increased risk for a hemoglobinopathy but would have a 1 in 2 (50%) chance for sickle cell trait. We also reviewed the availability of newborn screening in [place]  for hemoglobinopathies.   A complete detailed ultrasound was performed today.  The ultrasound report will be sent under separate cover.  The patient understands that prenatal ultrasound would not be expected to visualized signs of sickle cell trait or  sickle cell anemia. She understands that ultrasound cannot diagnose or rule out all birth defects or genetic conditions.   Both family histories were reviewed and found to be otherwise contributory for a paternal half-sister to the patient with possible autism. She is 25 years old, and a specific etiology is not known for her autism. We discussed that autism is part of the spectrum of conditions referred to as Autistic spectrum disorders (ASD). We discussed that ASDs are among the most common neurodevelopmental disorders, with approximately 1 in 88 children meeting criteria for ASD. Approximately 80% of individuals diagnosed are female. There is strong evidence that genetic factors play a critical role in development of ASD. There have been recent advances in identifying specific genetic causes of ASD, however, there are still many individuals for whom the etiology of the ASD is not known. Once a family has a child with a diagnosis of ASD, there is a 13.5% chance to have another child with ASD. If the pregnancy is female the chance is approximately 9%, and approximately 26% if the pregnancy is female. We discussed that recurrence risk data are limited for extended degree relatives in the absence of an identified genetic cause. Therefore, based on the reported family history, recurrence risk for the current pregnancy (a third degree relative to the affected individual) would likely be similar to the general population risk. Additional information regarding this relative may alter recurrence risk assessment. She understands that at this time there is not genetic testing available for ASD for most families during pregnancy. Without further information regarding the provided family history, an accurate genetic risk cannot be calculated. Further genetic counseling is warranted if more information is obtained.  Suzanne [name] denied exposure to environmental toxins or chemical agents. She denied the use of alcohol,  tobacco or street drugs. She denied significant viral illnesses during the course of her pregnancy. Her medical and surgical histories were otherwise noncontributory.    I counseled Ms. Nautika L Sennett regarding the above risks and available options.  The approximate face-to-face time with the genetic counselor was 25 minutes.  Quinn Plowman, MS Certified Genetic Counselor 05/08/2013

## 2013-05-21 ENCOUNTER — Ambulatory Visit (INDEPENDENT_AMBULATORY_CARE_PROVIDER_SITE_OTHER): Payer: Medicaid Other | Admitting: Obstetrics

## 2013-05-21 ENCOUNTER — Encounter: Payer: Self-pay | Admitting: Obstetrics

## 2013-05-21 VITALS — BP 116/82 | Temp 98.5°F | Wt 226.8 lb

## 2013-05-21 DIAGNOSIS — Z3402 Encounter for supervision of normal first pregnancy, second trimester: Secondary | ICD-10-CM

## 2013-05-21 DIAGNOSIS — Z3400 Encounter for supervision of normal first pregnancy, unspecified trimester: Secondary | ICD-10-CM

## 2013-05-21 LAB — POCT URINALYSIS DIPSTICK
Bilirubin, UA: NEGATIVE
Blood, UA: NEGATIVE
Glucose, UA: NEGATIVE
Ketones, UA: NEGATIVE
Leukocytes, UA: NEGATIVE
Nitrite, UA: NEGATIVE
Protein, UA: NEGATIVE
Spec Grav, UA: 1.02
Urobilinogen, UA: NEGATIVE
pH, UA: 5

## 2013-05-21 NOTE — Progress Notes (Signed)
Pulse: 99 

## 2013-06-18 ENCOUNTER — Ambulatory Visit (INDEPENDENT_AMBULATORY_CARE_PROVIDER_SITE_OTHER): Payer: Medicaid Other | Admitting: Obstetrics

## 2013-06-18 ENCOUNTER — Other Ambulatory Visit: Payer: Medicaid Other

## 2013-06-18 ENCOUNTER — Encounter: Payer: Self-pay | Admitting: Obstetrics

## 2013-06-18 VITALS — BP 123/83 | Temp 98.2°F | Wt 232.6 lb

## 2013-06-18 DIAGNOSIS — Z3400 Encounter for supervision of normal first pregnancy, unspecified trimester: Secondary | ICD-10-CM

## 2013-06-18 DIAGNOSIS — Z3402 Encounter for supervision of normal first pregnancy, second trimester: Secondary | ICD-10-CM

## 2013-06-18 LAB — CBC
HCT: 33 % — ABNORMAL LOW (ref 36.0–46.0)
Hemoglobin: 10.8 g/dL — ABNORMAL LOW (ref 12.0–15.0)
MCH: 24.1 pg — ABNORMAL LOW (ref 26.0–34.0)
MCHC: 32.7 g/dL (ref 30.0–36.0)
MCV: 73.7 fL — ABNORMAL LOW (ref 78.0–100.0)
Platelets: 291 10*3/uL (ref 150–400)
RBC: 4.48 MIL/uL (ref 3.87–5.11)
RDW: 15.8 % — ABNORMAL HIGH (ref 11.5–15.5)
WBC: 10.6 10*3/uL — ABNORMAL HIGH (ref 4.0–10.5)

## 2013-06-18 LAB — POCT URINALYSIS DIPSTICK
Bilirubin, UA: NEGATIVE
Blood, UA: NEGATIVE
Glucose, UA: NEGATIVE
Ketones, UA: NEGATIVE
Leukocytes, UA: NEGATIVE
Nitrite, UA: NEGATIVE
Protein, UA: NEGATIVE
Spec Grav, UA: 1.015
Urobilinogen, UA: NEGATIVE
pH, UA: 5

## 2013-06-18 NOTE — Progress Notes (Signed)
Pulse-105 

## 2013-06-19 LAB — GLUCOSE TOLERANCE, 2 HOURS W/ 1HR
Glucose, 1 hour: 102 mg/dL (ref 70–170)
Glucose, 2 hour: 112 mg/dL (ref 70–139)
Glucose, Fasting: 82 mg/dL (ref 70–99)

## 2013-06-19 LAB — HIV ANTIBODY (ROUTINE TESTING W REFLEX): HIV: NONREACTIVE

## 2013-06-19 LAB — RPR

## 2013-07-02 ENCOUNTER — Encounter: Payer: Self-pay | Admitting: Obstetrics

## 2013-07-02 ENCOUNTER — Ambulatory Visit (INDEPENDENT_AMBULATORY_CARE_PROVIDER_SITE_OTHER): Payer: Medicaid Other | Admitting: Obstetrics

## 2013-07-02 VITALS — BP 120/80 | Temp 98.4°F | Wt 234.0 lb

## 2013-07-02 DIAGNOSIS — Z3403 Encounter for supervision of normal first pregnancy, third trimester: Secondary | ICD-10-CM

## 2013-07-02 DIAGNOSIS — Z3402 Encounter for supervision of normal first pregnancy, second trimester: Secondary | ICD-10-CM

## 2013-07-02 DIAGNOSIS — Z3400 Encounter for supervision of normal first pregnancy, unspecified trimester: Secondary | ICD-10-CM

## 2013-07-02 LAB — POCT URINALYSIS DIPSTICK
Bilirubin, UA: NEGATIVE
Blood, UA: NEGATIVE
Glucose, UA: NEGATIVE
Ketones, UA: NEGATIVE
Leukocytes, UA: NEGATIVE
Nitrite, UA: NEGATIVE
Protein, UA: NEGATIVE
Spec Grav, UA: 1.015
Urobilinogen, UA: NEGATIVE
pH, UA: 6

## 2013-07-02 NOTE — Progress Notes (Signed)
Pt states she is having vaginal pain. Pt states she stands on her feet for 8 hours a day with one 30 minute break.

## 2013-07-16 ENCOUNTER — Ambulatory Visit (INDEPENDENT_AMBULATORY_CARE_PROVIDER_SITE_OTHER): Payer: Medicaid Other | Admitting: Obstetrics

## 2013-07-16 ENCOUNTER — Encounter: Payer: Self-pay | Admitting: Obstetrics

## 2013-07-16 VITALS — BP 126/83 | Temp 97.7°F | Wt 240.0 lb

## 2013-07-16 DIAGNOSIS — Z3403 Encounter for supervision of normal first pregnancy, third trimester: Secondary | ICD-10-CM

## 2013-07-16 DIAGNOSIS — Z3400 Encounter for supervision of normal first pregnancy, unspecified trimester: Secondary | ICD-10-CM

## 2013-07-16 LAB — POCT URINALYSIS DIPSTICK
Bilirubin, UA: NEGATIVE
Blood, UA: NEGATIVE
Glucose, UA: NEGATIVE
Ketones, UA: NEGATIVE
Leukocytes, UA: NEGATIVE
Nitrite, UA: NEGATIVE
Protein, UA: NEGATIVE
Spec Grav, UA: 1.015
Urobilinogen, UA: NEGATIVE
pH, UA: 6

## 2013-07-16 NOTE — Progress Notes (Signed)
Pulse-106 Pt states she continues to have vaginal pain.

## 2013-07-30 ENCOUNTER — Encounter: Payer: Self-pay | Admitting: Obstetrics

## 2013-07-30 ENCOUNTER — Ambulatory Visit (INDEPENDENT_AMBULATORY_CARE_PROVIDER_SITE_OTHER): Payer: Medicaid Other | Admitting: Obstetrics

## 2013-07-30 VITALS — BP 130/84 | Temp 97.1°F | Wt 243.0 lb

## 2013-07-30 DIAGNOSIS — O3663X9 Maternal care for excessive fetal growth, third trimester, other fetus: Secondary | ICD-10-CM

## 2013-07-30 DIAGNOSIS — Z3400 Encounter for supervision of normal first pregnancy, unspecified trimester: Secondary | ICD-10-CM

## 2013-07-30 DIAGNOSIS — O3660X0 Maternal care for excessive fetal growth, unspecified trimester, not applicable or unspecified: Secondary | ICD-10-CM

## 2013-07-30 DIAGNOSIS — Z3403 Encounter for supervision of normal first pregnancy, third trimester: Secondary | ICD-10-CM

## 2013-07-30 LAB — POCT URINALYSIS DIPSTICK
Bilirubin, UA: NEGATIVE
Blood, UA: NEGATIVE
Glucose, UA: NEGATIVE
Ketones, UA: NEGATIVE
Leukocytes, UA: NEGATIVE
Nitrite, UA: NEGATIVE
Protein, UA: NEGATIVE
Spec Grav, UA: 1.015
Urobilinogen, UA: NEGATIVE
pH, UA: 6

## 2013-07-30 NOTE — Progress Notes (Signed)
Pulse-113 Pt states she is having lower back pain. Pt states she believes she had a cold last week but is feeling better.

## 2013-07-31 ENCOUNTER — Other Ambulatory Visit: Payer: Self-pay

## 2013-08-05 ENCOUNTER — Ambulatory Visit (HOSPITAL_COMMUNITY): Admission: RE | Admit: 2013-08-05 | Payer: Medicaid Other | Source: Ambulatory Visit

## 2013-08-05 ENCOUNTER — Ambulatory Visit (HOSPITAL_COMMUNITY)
Admission: RE | Admit: 2013-08-05 | Discharge: 2013-08-05 | Disposition: A | Discharge status: Home / Self Care | Payer: Medicaid Other | Source: Ambulatory Visit | Attending: Obstetrics | Admitting: Obstetrics

## 2013-08-05 DIAGNOSIS — Z3689 Encounter for other specified antenatal screening: Secondary | ICD-10-CM | POA: Insufficient documentation

## 2013-08-05 DIAGNOSIS — O3663X9 Maternal care for excessive fetal growth, third trimester, other fetus: Secondary | ICD-10-CM

## 2013-08-05 DIAGNOSIS — O3660X0 Maternal care for excessive fetal growth, unspecified trimester, not applicable or unspecified: Secondary | ICD-10-CM | POA: Insufficient documentation

## 2013-08-13 ENCOUNTER — Encounter: Payer: Self-pay | Admitting: Obstetrics

## 2013-08-13 ENCOUNTER — Ambulatory Visit (INDEPENDENT_AMBULATORY_CARE_PROVIDER_SITE_OTHER): Payer: Medicaid Other | Admitting: Obstetrics

## 2013-08-13 VITALS — BP 133/83 | Temp 99.2°F | Wt 245.0 lb

## 2013-08-13 DIAGNOSIS — Z3400 Encounter for supervision of normal first pregnancy, unspecified trimester: Secondary | ICD-10-CM

## 2013-08-13 DIAGNOSIS — Z3403 Encounter for supervision of normal first pregnancy, third trimester: Secondary | ICD-10-CM

## 2013-08-13 LAB — POCT URINALYSIS DIPSTICK
Bilirubin, UA: NEGATIVE
Blood, UA: NEGATIVE
Glucose, UA: NEGATIVE
Ketones, UA: NEGATIVE
Leukocytes, UA: NEGATIVE
Nitrite, UA: NEGATIVE
Protein, UA: NEGATIVE
Spec Grav, UA: 1.015
Urobilinogen, UA: NEGATIVE
pH, UA: 6

## 2013-08-13 NOTE — Progress Notes (Signed)
HR - 109 Pt in office for routine OB visit, reports fatigue, painful feet, and pelvic pain.

## 2013-08-19 ENCOUNTER — Encounter (HOSPITAL_COMMUNITY): Payer: Self-pay | Admitting: *Deleted

## 2013-08-19 ENCOUNTER — Inpatient Hospital Stay (HOSPITAL_COMMUNITY)
Admission: AD | Admit: 2013-08-19 | Discharge: 2013-08-19 | Disposition: A | Discharge status: Home / Self Care | Payer: Medicaid Other | Source: Ambulatory Visit | Attending: Obstetrics | Admitting: Obstetrics

## 2013-08-19 DIAGNOSIS — N949 Unspecified condition associated with female genital organs and menstrual cycle: Secondary | ICD-10-CM | POA: Insufficient documentation

## 2013-08-19 DIAGNOSIS — O99891 Other specified diseases and conditions complicating pregnancy: Secondary | ICD-10-CM | POA: Insufficient documentation

## 2013-08-19 DIAGNOSIS — O479 False labor, unspecified: Secondary | ICD-10-CM

## 2013-08-19 DIAGNOSIS — R109 Unspecified abdominal pain: Secondary | ICD-10-CM | POA: Insufficient documentation

## 2013-08-19 DIAGNOSIS — N898 Other specified noninflammatory disorders of vagina: Secondary | ICD-10-CM

## 2013-08-19 DIAGNOSIS — N899 Noninflammatory disorder of vagina, unspecified: Secondary | ICD-10-CM

## 2013-08-19 DIAGNOSIS — O4700 False labor before 37 completed weeks of gestation, unspecified trimester: Secondary | ICD-10-CM | POA: Insufficient documentation

## 2013-08-19 LAB — URINE MICROSCOPIC-ADD ON

## 2013-08-19 LAB — URINALYSIS, ROUTINE W REFLEX MICROSCOPIC
Bilirubin Urine: NEGATIVE
Glucose, UA: NEGATIVE mg/dL
Hgb urine dipstick: NEGATIVE
Ketones, ur: NEGATIVE mg/dL
Nitrite: NEGATIVE
Protein, ur: NEGATIVE mg/dL
Specific Gravity, Urine: 1.01 (ref 1.005–1.030)
Urobilinogen, UA: 0.2 mg/dL (ref 0.0–1.0)
pH: 6 (ref 5.0–8.0)

## 2013-08-19 LAB — WET PREP, GENITAL
Clue Cells Wet Prep HPF POC: NONE SEEN
Trich, Wet Prep: NONE SEEN
Yeast Wet Prep HPF POC: NONE SEEN

## 2013-08-19 MED ORDER — NIFEDIPINE 10 MG PO CAPS
10.0000 mg | ORAL_CAPSULE | Freq: Once | ORAL | Status: AC
  Administered 2013-08-19: 10 mg via ORAL
  Filled 2013-08-19: qty 1

## 2013-08-19 MED ORDER — NIFEDIPINE ER OSMOTIC RELEASE 30 MG PO TB24: 30.0000 mg | ORAL_TABLET | Freq: Two times a day (BID) | ORAL | Status: DC

## 2013-08-19 NOTE — MAU Note (Signed)
Patient states she has had lower abdominal pain and pressure since last week. Denies bleeding or leaking. Does have a little vaginal irritation from using wipes. Reports good fetal movement.

## 2013-08-19 NOTE — MAU Provider Note (Signed)
History     CSN: [id number]  Arrival date and time: 08/19/13 1054   First Provider Initiated Contact with Patient 08/19/13 1134      Chief Complaint  Patient presents with  . Abdominal Pain   HPI  Ms. Suzanne [name] is a 27 y.o. female G1P0 at [redacted]w[redacted]d who presents who complaints of pelvic pressure and abnormal vaginal discomfort. Pelvic pain is worse when she is on her feet and the pelvic pain is worse at night. Currently the pain is ok, she rates it a 5/10. She is also concerned about vaginal irritation that started a few days ago. The irritation in her vagina is described at itchy. She reports good fetal movement, denies LOF, vaginal bleeding, urinary symptoms, h/a, dizziness, n/v, or fever/chills.    OB History   Grav Para Term Preterm Abortions TAB SAB Ect Mult Living   1               Past Medical History  Diagnosis Date  . Sickle cell trait   . Acid reflux     Past Surgical History  Procedure Laterality Date  . No past surgeries      Family History  Problem Relation Age of Onset  . Diabetes Mother   . Arthritis Mother   . Hypertension Mother   . Diabetes Maternal Grandmother   . Hypertension Maternal Grandmother   . Cancer Neg Hx   . Heart disease Neg Hx     History  Substance Use Topics  . Smoking status: Never Smoker   . Smokeless tobacco: Never Used  . Alcohol Use: No    Allergies: No Known Allergies  Prescriptions prior to admission  Medication Sig Dispense Refill  . acetaminophen (TYLENOL) 500 MG tablet Take 1,000 mg by mouth every 6 (six) hours as needed for mild pain or headache.       . glycopyrrolate (ROBINUL) 1 MG tablet Take 1 mg by mouth 3 (three) times daily as needed (For excess saliva.).      Marland Kitchen omeprazole (PRILOSEC) 20 MG capsule Take 20 mg by mouth daily as needed (For heartburn.).      Marland Kitchen Prenatal Vit-Fe Fumarate-FA (PRENATAL MULTIVITAMIN) TABS tablet Take 1 tablet by mouth daily at 12 noon.      . [DISCONTINUED] glycopyrrolate  (ROBINUL) 1 MG tablet Take 1 tablet (1 mg total) by mouth 3 (three) times daily.  90 tablet  4  . [DISCONTINUED] omeprazole (PRILOSEC) 20 MG capsule Take 1 capsule (20 mg total) by mouth daily.  60 capsule  5  . promethazine (PHENERGAN) 25 MG tablet Take 1 tablet (25 mg total) by mouth every 8 (eight) hours as needed for nausea.  60 tablet  1   Results for orders placed during the hospital encounter of 08/19/13 (from the past 24 hour(s))  URINALYSIS, ROUTINE W REFLEX MICROSCOPIC     Status: Abnormal   Collection Time    08/19/13 11:05 AM      Result Value Range   Color, Urine YELLOW  YELLOW   APPearance CLEAR  CLEAR   Specific Gravity, Urine 1.010  1.005 - 1.030   pH 6.0  5.0 - 8.0   Glucose, UA NEGATIVE  NEGATIVE mg/dL   Hgb urine dipstick NEGATIVE  NEGATIVE   Bilirubin Urine NEGATIVE  NEGATIVE   Ketones, ur NEGATIVE  NEGATIVE mg/dL   Protein, ur NEGATIVE  NEGATIVE mg/dL   Urobilinogen, UA 0.2  0.0 - 1.0 mg/dL   Nitrite NEGATIVE  NEGATIVE  Leukocytes, UA SMALL (*) NEGATIVE  URINE MICROSCOPIC-ADD ON     Status: Abnormal   Collection Time    08/19/13 11:05 AM      Result Value Range   Squamous Epithelial / LPF FEW (*) RARE   WBC, UA 3-6  <3 WBC/hpf   Bacteria, UA RARE  RARE  WET PREP, GENITAL     Status: Abnormal   Collection Time    08/19/13 11:47 AM      Result Value Range   Yeast Wet Prep HPF POC NONE SEEN  NONE SEEN   Trich, Wet Prep NONE SEEN  NONE SEEN   Clue Cells Wet Prep HPF POC NONE SEEN  NONE SEEN   WBC, Wet Prep HPF POC FEW (*) NONE SEEN    Review of Systems  Constitutional: Negative for fever and chills.  Gastrointestinal: Positive for abdominal pain. Negative for nausea, vomiting, diarrhea and constipation.  Genitourinary: Negative for dysuria, urgency, frequency and hematuria.       No vaginal discharge. No vaginal bleeding. No dysuria.    Physical Exam   Blood pressure 115/74, pulse 111, temperature 98.3 F (36.8 C), temperature source Oral, resp.  rate 18, height 4' 9.5" (1.461 m), weight 111.766 kg (246 lb 6.4 oz), last menstrual period 01/01/2013, SpO2 100.00%.  Physical Exam  Constitutional: She is oriented to person, place, and time. She appears well-developed and well-nourished.  HENT:  Head: Normocephalic.  Eyes: Pupils are equal, round, and reactive to light.  Neck: Neck supple.  Respiratory: Effort normal.  GI: Soft. She exhibits no distension. There is no tenderness. There is no rebound and no guarding.  Genitourinary:  Speculum exam: Vagina - Small amount of creamy discharge, no odor Cervix - No contact bleeding, no CMT, no erythema  Bimanual exam: Dilation: Fingertip Effacement (%): Thick Cervical Position: Posterior Station: -3 Exam by:: Venia Carbon NP GC/Chlam, wet prep done Chaperone present for exam.   Neurological: She is alert and oriented to person, place, and time.  Skin: Skin is warm.   Dilation: Fingertip Effacement (%): Thick Cervical Position: Posterior Station: -3 Exam by:: Elonda Husky RN   Fetal Tracing:  Baseline: 135 bpm Variability: Moderate  Accelerations: 15x15 Decelerations: None  Toco: Irregular contraction pattern    MAU Course  Procedures None  MDM NST UA  Wet prep GC/Chlamydia  Procardia   Consulted with Dr. Clearance Coots; send patient home with procardia to take until 36 weeks.  Assessment and Plan   A:  1. Vaginal irritation   2. Braxton Hicks contractions     P: Discharge home RX: Procardia XL Pregnancy support belt encouraged  Follow up with Dr. Verdell Carmine office if vaginal irritation does not improve.  Return to MAU as needed, if symptoms worsen  Jarret Torre IRENE NP  08/19/2013, 4:52 PM

## 2013-08-20 LAB — OB RESULTS CONSOLE GC/CHLAMYDIA
Chlamydia: NEGATIVE
Gonorrhea: NEGATIVE

## 2013-08-20 LAB — GC/CHLAMYDIA PROBE AMP
CT Probe RNA: NEGATIVE
GC Probe RNA: NEGATIVE

## 2013-08-27 ENCOUNTER — Ambulatory Visit (INDEPENDENT_AMBULATORY_CARE_PROVIDER_SITE_OTHER): Payer: Medicaid Other | Admitting: Obstetrics

## 2013-08-27 ENCOUNTER — Encounter: Payer: Self-pay | Admitting: Obstetrics

## 2013-08-27 VITALS — BP 123/79 | Temp 98.2°F | Wt 243.0 lb

## 2013-08-27 DIAGNOSIS — Z3403 Encounter for supervision of normal first pregnancy, third trimester: Secondary | ICD-10-CM

## 2013-08-27 DIAGNOSIS — Z3400 Encounter for supervision of normal first pregnancy, unspecified trimester: Secondary | ICD-10-CM

## 2013-08-27 LAB — POCT URINALYSIS DIPSTICK
Bilirubin, UA: NEGATIVE
Blood, UA: NEGATIVE
Glucose, UA: NEGATIVE
Ketones, UA: NEGATIVE
Nitrite, UA: NEGATIVE
Protein, UA: NEGATIVE
Spec Grav, UA: <=1.005
Urobilinogen, UA: NEGATIVE
pH, UA: 8

## 2013-08-27 NOTE — Progress Notes (Signed)
Pulse 114 Subjective:    Suzanne [name] is a 27 y.o. female being seen today for her obstetrical visit. She is at [redacted]w[redacted]d gestation. Patient reports occasional contractions. Pt states that she was seen in MAU on last Tuesday 08/19/13 due to increased pain and pressure.  Pt was started on Procardia from there and sent home. Fetal movement: normal.  Menstrual History: OB History   Grav Para Term Preterm Abortions TAB SAB Ect Mult Living   1               Menarche age: not asked  Patient's last menstrual period was 01/01/2013.    The following portions of the patient's history were reviewed and updated as appropriate: allergies, current medications, past family history, past medical history, past social history, past surgical history and problem list.  Review of Systems Pertinent items are noted in HPI.   Objective:    BP 123/79  Temp(Src) 98.2 F (36.8 C)  Wt 243 lb (110.224 kg)  LMP 01/01/2013 FHT: 150 BPM  Uterine Size: size equals dates                               Assessment:    Pregnancy 34 and 0/7 weeks   Plan:   Plans for delivery: Vaginal anticipated Beta strep culture: discussed Counseling: Done Fetal testing: None Follow up in 2 Weeks.

## 2013-09-10 ENCOUNTER — Encounter: Payer: Self-pay | Admitting: Obstetrics

## 2013-09-10 ENCOUNTER — Ambulatory Visit (INDEPENDENT_AMBULATORY_CARE_PROVIDER_SITE_OTHER): Payer: Medicaid Other | Admitting: Obstetrics

## 2013-09-10 VITALS — BP 123/83 | Temp 98.3°F | Wt 251.0 lb

## 2013-09-10 DIAGNOSIS — Z3403 Encounter for supervision of normal first pregnancy, third trimester: Secondary | ICD-10-CM

## 2013-09-10 DIAGNOSIS — O3660X0 Maternal care for excessive fetal growth, unspecified trimester, not applicable or unspecified: Secondary | ICD-10-CM

## 2013-09-10 DIAGNOSIS — O3660X3 Maternal care for excessive fetal growth, unspecified trimester, fetus 3: Secondary | ICD-10-CM

## 2013-09-10 DIAGNOSIS — Z3400 Encounter for supervision of normal first pregnancy, unspecified trimester: Secondary | ICD-10-CM

## 2013-09-10 LAB — POCT URINALYSIS DIPSTICK
Bilirubin, UA: NEGATIVE
Blood, UA: NEGATIVE
Glucose, UA: NEGATIVE
Ketones, UA: NEGATIVE
Nitrite, UA: NEGATIVE
Spec Grav, UA: 1.015
Urobilinogen, UA: NEGATIVE
pH, UA: 5

## 2013-09-10 NOTE — Progress Notes (Signed)
HR - 112 Pt in office for routine OB visit, denies any concerns at this time

## 2013-09-16 ENCOUNTER — Ambulatory Visit (INDEPENDENT_AMBULATORY_CARE_PROVIDER_SITE_OTHER): Payer: Medicaid Other

## 2013-09-16 ENCOUNTER — Encounter: Payer: Self-pay | Admitting: Obstetrics

## 2013-09-16 ENCOUNTER — Ambulatory Visit (INDEPENDENT_AMBULATORY_CARE_PROVIDER_SITE_OTHER): Payer: Medicaid Other | Admitting: Obstetrics

## 2013-09-16 VITALS — BP 143/82 | Temp 97.7°F | Wt 251.0 lb

## 2013-09-16 DIAGNOSIS — Z3400 Encounter for supervision of normal first pregnancy, unspecified trimester: Secondary | ICD-10-CM

## 2013-09-16 DIAGNOSIS — Z369 Encounter for antenatal screening, unspecified: Secondary | ICD-10-CM

## 2013-09-16 DIAGNOSIS — O3660X3 Maternal care for excessive fetal growth, unspecified trimester, fetus 3: Secondary | ICD-10-CM

## 2013-09-16 DIAGNOSIS — O365990 Maternal care for other known or suspected poor fetal growth, unspecified trimester, not applicable or unspecified: Secondary | ICD-10-CM

## 2013-09-16 DIAGNOSIS — Z3403 Encounter for supervision of normal first pregnancy, third trimester: Secondary | ICD-10-CM

## 2013-09-16 LAB — POCT URINALYSIS DIPSTICK
Bilirubin, UA: NEGATIVE
Glucose, UA: NEGATIVE
Ketones, UA: NEGATIVE
Nitrite, UA: NEGATIVE
Spec Grav, UA: 1.015
Urobilinogen, UA: NEGATIVE
pH, UA: 5

## 2013-09-16 NOTE — Progress Notes (Signed)
Pulse 114 Pt is having pressure and is becoming more difficult walking.

## 2013-09-18 LAB — STREP B DNA PROBE: GBSP: POSITIVE

## 2013-09-19 ENCOUNTER — Inpatient Hospital Stay (HOSPITAL_COMMUNITY)
Admission: AD | Admit: 2013-09-19 | Discharge: 2013-09-20 | Disposition: A | Discharge status: Home / Self Care | Payer: Medicaid Other | Source: Ambulatory Visit | Attending: Obstetrics & Gynecology | Admitting: Obstetrics & Gynecology

## 2013-09-19 ENCOUNTER — Encounter (HOSPITAL_COMMUNITY): Payer: Self-pay | Admitting: *Deleted

## 2013-09-19 DIAGNOSIS — D573 Sickle-cell trait: Secondary | ICD-10-CM | POA: Insufficient documentation

## 2013-09-19 DIAGNOSIS — B3731 Acute candidiasis of vulva and vagina: Secondary | ICD-10-CM | POA: Insufficient documentation

## 2013-09-19 DIAGNOSIS — O479 False labor, unspecified: Secondary | ICD-10-CM | POA: Insufficient documentation

## 2013-09-19 DIAGNOSIS — O99019 Anemia complicating pregnancy, unspecified trimester: Secondary | ICD-10-CM | POA: Insufficient documentation

## 2013-09-19 DIAGNOSIS — B373 Candidiasis of vulva and vagina: Secondary | ICD-10-CM | POA: Insufficient documentation

## 2013-09-19 DIAGNOSIS — O3660X0 Maternal care for excessive fetal growth, unspecified trimester, not applicable or unspecified: Secondary | ICD-10-CM | POA: Insufficient documentation

## 2013-09-19 DIAGNOSIS — N949 Unspecified condition associated with female genital organs and menstrual cycle: Secondary | ICD-10-CM | POA: Insufficient documentation

## 2013-09-19 DIAGNOSIS — O2390 Unspecified genitourinary tract infection in pregnancy, unspecified trimester: Secondary | ICD-10-CM | POA: Insufficient documentation

## 2013-09-19 DIAGNOSIS — O99891 Other specified diseases and conditions complicating pregnancy: Secondary | ICD-10-CM | POA: Insufficient documentation

## 2013-09-19 NOTE — MAU Note (Signed)
Leaking fld for couple wks since he dropped but worse this wk. Thought maybe was sitting on bladder. Today was first day had to wear pad for leaking. Just stays wet throughout the day. Sometimes look brownish on panties. Some pelvic pain and pressure

## 2013-09-20 DIAGNOSIS — B373 Candidiasis of vulva and vagina: Secondary | ICD-10-CM

## 2013-09-20 LAB — WET PREP, GENITAL
Clue Cells Wet Prep HPF POC: NONE SEEN
Trich, Wet Prep: NONE SEEN
Yeast Wet Prep HPF POC: NONE SEEN

## 2013-09-20 LAB — POCT FERN TEST: POCT Fern Test: NEGATIVE

## 2013-09-20 MED ORDER — TERCONAZOLE 0.4 % VA CREA: 1.0000 | TOPICAL_CREAM | Freq: Every day | VAGINAL | Status: DC

## 2013-09-20 NOTE — MAU Provider Note (Signed)
Chief Complaint:  Rupture of Membranes and Pelvic Pain   First Provider Initiated Contact with Patient 09/20/13 0031     HPI: Suzanne [name] is a 27 y.o. G1P0 at [redacted]w[redacted]d who presents to maternity admissions reporting ? LOF x a few weeks, more today. Describes fluid as thick and sometimes yellow or light brown. Also reports rash on buttocks. Mild contractions and pelvic pressure. Good fetal movement.   Past Medical History: Past Medical History  Diagnosis Date  . Sickle cell trait   . Acid reflux    Patient Active Problem List   Diagnosis Date Noted  . Excessive fetal growth affecting management of mother, antepartum 09/10/2013  . Sickle cell trait 04/11/2013  . Excessive salivation while pregnant 04/11/2013  . URI 07/16/2007  . FOOT PAIN, BILATERAL 06/04/2007  . SICKLE CELL TRAIT 11/22/2006  . RHINITIS, ALLERGIC 11/22/2006   Past obstetric history: OB History  Gravida Para Term Preterm AB SAB TAB Ectopic Multiple Living  1             # Outcome Date GA Lbr Len/2nd Weight Sex Delivery Anes PTL Lv  1 CUR               Past Surgical History: Past Surgical History  Procedure Laterality Date  . No past surgeries       Family History: Family History  Problem Relation Age of Onset  . Diabetes Mother   . Arthritis Mother   . Hypertension Mother   . Diabetes Maternal Grandmother   . Hypertension Maternal Grandmother   . Cancer Neg Hx   . Heart disease Neg Hx     Social History: History  Substance Use Topics  . Smoking status: Never Smoker   . Smokeless tobacco: Never Used  . Alcohol Use: No    Allergies: No Known Allergies  Meds:  Prescriptions prior to admission  Medication Sig Dispense Refill  . acetaminophen (TYLENOL) 500 MG tablet Take 1,000 mg by mouth every 6 (six) hours as needed for mild pain or headache.       . glycopyrrolate (ROBINUL) 1 MG tablet Take 1 mg by mouth 3 (three) times daily as needed (For excess saliva.).      Marland Kitchen omeprazole (PRILOSEC) 20 MG  capsule Take 20 mg by mouth daily as needed (For heartburn.).      Marland Kitchen Prenatal Vit-Fe Fumarate-FA (PRENATAL MULTIVITAMIN) TABS tablet Take 1 tablet by mouth daily at 12 noon.      . promethazine (PHENERGAN) 25 MG tablet Take 1 tablet (25 mg total) by mouth every 8 (eight) hours as needed for nausea.  60 tablet  1    ROS: Pertinent findings in history of present illness.  Physical Exam  Blood pressure 127/74, pulse 112, temperature 98.1 F (36.7 C), resp. rate 20, height 4\' 11"  (1.499 m), weight 115.123 kg (253 lb 12.8 oz), last menstrual period 01/01/2013. BMI 51.  GENERAL: Well-developed, well-nourished female in no acute distress.  HEENT: normocephalic HEART: normal rate RESP: normal effort ABDOMEN: Soft, non-tender, gravid, S>D.  EXTREMITIES: Nontender, no edema NEURO: alert and oriented SPECULUM EXAM: NEFG, large amount of yellowish-white, odorless, curd-like discharge, beg pooling, no blood, cervix clean Dilation: Closed Effacement (%): Thick Station: -3 Presentation: Vertex Exam by:: V.Jasiyah Paulding,CNM  FHT:  Baseline 130 , moderate variability, accelerations present, no decelerations Contractions: Rare, mild   Labs: Results for orders placed during the hospital encounter of 09/19/13 (from the past 24 hour(s))  POCT FERN TEST  Status: None   Collection Time    09/20/13 12:37 AM      Result Value Range   POCT Fern Test Negative = intact amniotic membranes    WET PREP, GENITAL     Status: Abnormal   Collection Time    09/20/13 12:40 AM      Result Value Range   Yeast Wet Prep HPF POC NONE SEEN  NONE SEEN   Trich, Wet Prep NONE SEEN  NONE SEEN   Clue Cells Wet Prep HPF POC NONE SEEN  NONE SEEN   WBC, Wet Prep HPF POC MODERATE (*) NONE SEEN   Imaging:  No results found.  MAU Course:   Assessment: 1. Vulvovaginal candidiasis   2. Braxton Hicks contractions    Plan: Discharge home in stable condition per consult w/ Dr. Tamela Oddi.  Labor precautions and fetal kick  counts.     Follow-up Information   Follow up with [facility]. (As scheduled or as needed if symptoms worsen)    Contact information:   [address] [place] [address] [phone]      Follow up with [facility] [place] [facility]. (As needed if symptoms worsen)    Contact information:   [address] [id number] [place] [address] [phone]       Medication List         acetaminophen 500 MG tablet  Commonly known as:  TYLENOL  Take 1,000 mg by mouth every 6 (six) hours as needed for mild pain or headache.     glycopyrrolate 1 MG tablet  Commonly known as:  ROBINUL  Take 1 mg by mouth 3 (three) times daily as needed (For excess saliva.).     omeprazole 20 MG capsule  Commonly known as:  PRILOSEC  Take 20 mg by mouth daily as needed (For heartburn.).     prenatal multivitamin Tabs tablet  Take 1 tablet by mouth daily at 12 noon.     promethazine 25 MG tablet  Commonly known as:  PHENERGAN  Take 1 tablet (25 mg total) by mouth every 8 (eight) hours as needed for nausea.     terconazole 0.4 % vaginal cream  Commonly known as:  TERAZOL 7  Place 1 applicator vaginally at bedtime.        [place], CNM 09/20/2013 2:03 AM

## 2013-09-22 ENCOUNTER — Encounter: Payer: Self-pay | Admitting: Obstetrics & Gynecology

## 2013-09-22 LAB — US OB DETAIL + 14 WK

## 2013-09-23 ENCOUNTER — Encounter: Payer: Medicaid Other | Admitting: Obstetrics

## 2013-09-23 ENCOUNTER — Encounter: Payer: Self-pay | Admitting: Obstetrics

## 2013-09-23 ENCOUNTER — Ambulatory Visit (INDEPENDENT_AMBULATORY_CARE_PROVIDER_SITE_OTHER): Payer: Medicaid Other | Admitting: Obstetrics

## 2013-09-23 VITALS — BP 120/76 | Temp 97.8°F | Wt 256.0 lb

## 2013-09-23 DIAGNOSIS — Z3403 Encounter for supervision of normal first pregnancy, third trimester: Secondary | ICD-10-CM

## 2013-09-23 DIAGNOSIS — Z3400 Encounter for supervision of normal first pregnancy, unspecified trimester: Secondary | ICD-10-CM

## 2013-09-23 LAB — POCT URINALYSIS DIPSTICK
Bilirubin, UA: NEGATIVE
Glucose, UA: NEGATIVE
Ketones, UA: NEGATIVE
Nitrite, UA: NEGATIVE
Spec Grav, UA: 1.02
Urobilinogen, UA: NEGATIVE
pH, UA: 5

## 2013-09-23 NOTE — Progress Notes (Signed)
Pt is having cold symptoms. Congestion and sore throat. Pt advised to try OTC medication to get relief.

## 2013-09-30 ENCOUNTER — Ambulatory Visit (INDEPENDENT_AMBULATORY_CARE_PROVIDER_SITE_OTHER): Payer: Medicaid Other | Admitting: Obstetrics

## 2013-09-30 ENCOUNTER — Encounter: Payer: Self-pay | Admitting: Obstetrics

## 2013-09-30 VITALS — BP 130/74 | Temp 97.3°F | Wt 259.0 lb

## 2013-09-30 DIAGNOSIS — Z3400 Encounter for supervision of normal first pregnancy, unspecified trimester: Secondary | ICD-10-CM

## 2013-09-30 DIAGNOSIS — Z3403 Encounter for supervision of normal first pregnancy, third trimester: Secondary | ICD-10-CM

## 2013-09-30 LAB — POCT URINALYSIS DIPSTICK
Bilirubin, UA: NEGATIVE
Blood, UA: NEGATIVE
Ketones, UA: NEGATIVE
Nitrite, UA: NEGATIVE
Spec Grav, UA: 1.01
Urobilinogen, UA: NEGATIVE
pH, UA: 6

## 2013-09-30 NOTE — Progress Notes (Signed)
  Pt states she is having pelvic pain and pressure. Pt states she is having pressure in her bottom. Pt states she is having trouble sleeping on her sides due to pain and discomfort.

## 2013-10-07 ENCOUNTER — Encounter (HOSPITAL_COMMUNITY): Payer: Self-pay | Admitting: *Deleted

## 2013-10-07 ENCOUNTER — Inpatient Hospital Stay (HOSPITAL_COMMUNITY)
Admission: AD | Admit: 2013-10-07 | Discharge: 2013-10-10 | DRG: 766 | Disposition: A | Discharge status: Home / Self Care | Payer: Medicaid Other | Source: Ambulatory Visit | Attending: Obstetrics & Gynecology | Admitting: Obstetrics & Gynecology

## 2013-10-07 ENCOUNTER — Encounter: Payer: Medicaid Other | Admitting: Obstetrics

## 2013-10-07 DIAGNOSIS — Z22330 Carrier of Group B streptococcus: Secondary | ICD-10-CM

## 2013-10-07 DIAGNOSIS — O9989 Other specified diseases and conditions complicating pregnancy, childbirth and the puerperium: Secondary | ICD-10-CM

## 2013-10-07 DIAGNOSIS — D500 Iron deficiency anemia secondary to blood loss (chronic): Secondary | ICD-10-CM | POA: Diagnosis not present

## 2013-10-07 DIAGNOSIS — O99892 Other specified diseases and conditions complicating childbirth: Secondary | ICD-10-CM | POA: Diagnosis present

## 2013-10-07 DIAGNOSIS — O9902 Anemia complicating childbirth: Secondary | ICD-10-CM | POA: Diagnosis present

## 2013-10-07 DIAGNOSIS — IMO0001 Reserved for inherently not codable concepts without codable children: Secondary | ICD-10-CM

## 2013-10-07 DIAGNOSIS — O368390 Maternal care for abnormalities of the fetal heart rate or rhythm, unspecified trimester, not applicable or unspecified: Secondary | ICD-10-CM | POA: Clinically undetermined

## 2013-10-07 DIAGNOSIS — D573 Sickle-cell trait: Secondary | ICD-10-CM | POA: Diagnosis present

## 2013-10-07 LAB — CBC
HCT: 31.5 % — ABNORMAL LOW (ref 36.0–46.0)
Hemoglobin: 10.2 g/dL — ABNORMAL LOW (ref 12.0–15.0)
MCH: 23.4 pg — ABNORMAL LOW (ref 26.0–34.0)
MCHC: 32.4 g/dL (ref 30.0–36.0)
MCV: 72.4 fL — ABNORMAL LOW (ref 78.0–100.0)
Platelets: 262 10*3/uL (ref 150–400)
RBC: 4.35 MIL/uL (ref 3.87–5.11)
RDW: 16.1 % — ABNORMAL HIGH (ref 11.5–15.5)
WBC: 11 10*3/uL — ABNORMAL HIGH (ref 4.0–10.5)

## 2013-10-07 LAB — TYPE AND SCREEN
ABO/RH(D): A POS
Antibody Screen: NEGATIVE

## 2013-10-07 LAB — RPR: RPR Ser Ql: NONREACTIVE

## 2013-10-07 LAB — ABO/RH: ABO/RH(D): A POS

## 2013-10-07 MED ORDER — IBUPROFEN 600 MG PO TABS: 600.0000 mg | ORAL_TABLET | Freq: Four times a day (QID) | ORAL | Status: DC | PRN

## 2013-10-07 MED ORDER — ACETAMINOPHEN 325 MG PO TABS: 650.0000 mg | ORAL_TABLET | ORAL | Status: DC | PRN

## 2013-10-07 MED ORDER — NALBUPHINE SYRINGE 5 MG/0.5 ML
10.0000 mg | INJECTION | INTRAMUSCULAR | Status: DC | PRN
  Administered 2013-10-07: 10 mg via INTRAVENOUS
  Filled 2013-10-07: qty 1

## 2013-10-07 MED ORDER — FENTANYL 2.5 MCG/ML BUPIVACAINE 1/10 % EPIDURAL INFUSION (WH - ANES)
14.0000 mL/h | INTRAMUSCULAR | Status: DC | PRN
  Administered 2013-10-08 (×2): 12 mL/h via EPIDURAL
  Filled 2013-10-07 (×2): qty 125

## 2013-10-07 MED ORDER — LACTATED RINGERS IV SOLN
INTRAVENOUS | Status: DC
Start: 2013-10-07 — End: 2013-10-08
  Administered 2013-10-07 – 2013-10-08 (×3): via INTRAVENOUS

## 2013-10-07 MED ORDER — EPHEDRINE 5 MG/ML INJ
10.0000 mg | INTRAVENOUS | Status: DC | PRN
  Filled 2013-10-07: qty 4

## 2013-10-07 MED ORDER — NALBUPHINE HCL 10 MG/ML IJ SOLN
10.0000 mg | Freq: Four times a day (QID) | INTRAMUSCULAR | Status: DC | PRN
  Filled 2013-10-07: qty 1

## 2013-10-07 MED ORDER — PENICILLIN G POTASSIUM 5000000 UNITS IJ SOLR
5.0000 10*6.[IU] | Freq: Once | INTRAVENOUS | Status: AC
  Administered 2013-10-08: 5 10*6.[IU] via INTRAVENOUS
  Filled 2013-10-07: qty 5

## 2013-10-07 MED ORDER — LIDOCAINE HCL (PF) 1 % IJ SOLN
30.0000 mL | INTRAMUSCULAR | Status: DC | PRN
  Filled 2013-10-07: qty 30

## 2013-10-07 MED ORDER — LACTATED RINGERS IV SOLN
INTRAVENOUS | Status: DC
  Administered 2013-10-08 (×2): via INTRAVENOUS

## 2013-10-07 MED ORDER — PENICILLIN G POTASSIUM 5000000 UNITS IJ SOLR
2.5000 10*6.[IU] | INTRAVENOUS | Status: DC
  Administered 2013-10-08 (×4): 2.5 10*6.[IU] via INTRAVENOUS
  Filled 2013-10-07 (×7): qty 2.5

## 2013-10-07 MED ORDER — CITRIC ACID-SODIUM CITRATE 334-500 MG/5ML PO SOLN
30.0000 mL | ORAL | Status: DC | PRN
  Administered 2013-10-08: 30 mL via ORAL

## 2013-10-07 MED ORDER — PROMETHAZINE HCL 25 MG/ML IJ SOLN
25.0000 mg | Freq: Four times a day (QID) | INTRAMUSCULAR | Status: DC | PRN
  Administered 2013-10-07: 25 mg via INTRAMUSCULAR
  Filled 2013-10-07: qty 1

## 2013-10-07 MED ORDER — TERBUTALINE SULFATE 1 MG/ML IJ SOLN: 0.2500 mg | Freq: Once | INTRAMUSCULAR | Status: DC | PRN

## 2013-10-07 MED ORDER — OXYTOCIN 40 UNITS IN LACTATED RINGERS INFUSION - SIMPLE MED
1.0000 m[IU]/min | INTRAVENOUS | Status: DC
  Administered 2013-10-08: 1 m[IU]/min via INTRAVENOUS
  Filled 2013-10-07: qty 1000

## 2013-10-07 MED ORDER — PHENYLEPHRINE 40 MCG/ML (10ML) SYRINGE FOR IV PUSH (FOR BLOOD PRESSURE SUPPORT)
80.0000 ug | PREFILLED_SYRINGE | INTRAVENOUS | Status: DC | PRN
  Filled 2013-10-07: qty 10

## 2013-10-07 MED ORDER — NALBUPHINE HCL 10 MG/ML IJ SOLN
10.0000 mg | INTRAMUSCULAR | Status: DC | PRN
  Filled 2013-10-07: qty 1

## 2013-10-07 MED ORDER — NALBUPHINE SYRINGE 5 MG/0.5 ML
10.0000 mg | INJECTION | Freq: Four times a day (QID) | INTRAMUSCULAR | Status: DC | PRN
  Administered 2013-10-07: 10 mg via INTRAMUSCULAR
  Filled 2013-10-07 (×3): qty 1

## 2013-10-07 MED ORDER — OXYCODONE-ACETAMINOPHEN 5-325 MG PO TABS: 1.0000 | ORAL_TABLET | ORAL | Status: DC | PRN

## 2013-10-07 MED ORDER — PHENYLEPHRINE 40 MCG/ML (10ML) SYRINGE FOR IV PUSH (FOR BLOOD PRESSURE SUPPORT): 80.0000 ug | PREFILLED_SYRINGE | INTRAVENOUS | Status: DC | PRN

## 2013-10-07 MED ORDER — OXYTOCIN BOLUS FROM INFUSION: 500.0000 mL | INTRAVENOUS | Status: DC

## 2013-10-07 MED ORDER — ONDANSETRON HCL 4 MG/2ML IJ SOLN
4.0000 mg | Freq: Four times a day (QID) | INTRAMUSCULAR | Status: DC | PRN
  Administered 2013-10-08: 4 mg via INTRAVENOUS
  Filled 2013-10-07: qty 2

## 2013-10-07 MED ORDER — OXYCODONE-ACETAMINOPHEN 5-325 MG PO TABS
2.0000 | ORAL_TABLET | Freq: Once | ORAL | Status: AC
  Administered 2013-10-07: 2 via ORAL
  Filled 2013-10-07: qty 2

## 2013-10-07 MED ORDER — OXYTOCIN 40 UNITS IN LACTATED RINGERS INFUSION - SIMPLE MED: 62.5000 mL/h | INTRAVENOUS | Status: DC

## 2013-10-07 MED ORDER — LACTATED RINGERS IV SOLN
500.0000 mL | Freq: Once | INTRAVENOUS | Status: AC
  Administered 2013-10-08: 500 mL via INTRAVENOUS

## 2013-10-07 MED ORDER — LACTATED RINGERS IV SOLN
500.0000 mL | INTRAVENOUS | Status: DC | PRN
  Administered 2013-10-08 (×2): 500 mL via INTRAVENOUS

## 2013-10-07 MED ORDER — DIPHENHYDRAMINE HCL 50 MG/ML IJ SOLN
12.5000 mg | INTRAMUSCULAR | Status: AC | PRN
  Administered 2013-10-08 (×3): 12.5 mg via INTRAVENOUS
  Filled 2013-10-07 (×2): qty 1

## 2013-10-07 MED ORDER — EPHEDRINE 5 MG/ML INJ: 10.0000 mg | INTRAVENOUS | Status: DC | PRN

## 2013-10-07 NOTE — MAU Note (Signed)
UC's since 0530. Small amount bleeding. No recent exam or intercourse. + fetal movement.

## 2013-10-07 NOTE — H&P (Signed)
Suzanne [name] is a 28 y.o. female presenting for UC's. Maternal Medical History:  Reason for admission: Contractions.  28 yo G1.  EDC 10-08-13.  Presents with UC's.  Fetal activity: Perceived fetal activity is normal.   Last perceived fetal movement was within the past hour.    Prenatal complications: no prenatal complications Prenatal Complications - Diabetes: none.    OB History   Grav Para Term Preterm Abortions TAB SAB Ect Mult Living   1              Past Medical History  Diagnosis Date  . Sickle cell trait   . Acid reflux    Past Surgical History  Procedure Laterality Date  . No past surgeries     Family History: family history includes Arthritis in her mother; Diabetes in her maternal grandmother and mother; Hypertension in her maternal grandmother and mother. There is no history of Cancer or Heart disease. Social History:  reports that she has never smoked. She has never used smokeless tobacco. She reports that she does not drink alcohol or use illicit drugs.   Prenatal Transfer Tool  Maternal Diabetes: No Genetic Screening: Normal Maternal Ultrasounds/Referrals: Normal Fetal Ultrasounds or other Referrals:  None Maternal Substance Abuse:  No Significant Maternal Medications:  None Significant Maternal Lab Results:  GBS positive Other Comments:  None  Review of Systems  All other systems reviewed and are negative.    Dilation: 2 Effacement (%): 70 Station: -3 Exam by:: S. Carrera, RNC Blood pressure 128/78, pulse 103, temperature 97.8 F (36.6 C), temperature source Oral, resp. rate 18, height 4\' 11"  (1.499 m), weight 259 lb (117.482 kg), last menstrual period 01/01/2013, SpO2 99.00%. Maternal Exam:  Uterine Assessment: Contraction strength is moderate.  Contraction frequency is regular.   Abdomen: Patient reports no abdominal tenderness. Fetal presentation: vertex  Introitus: Normal vulva. Normal vagina.  Pelvis: adequate for delivery.   Cervix:  Cervix evaluated by digital exam.     Physical Exam  Nursing note and vitals reviewed. Constitutional: She is oriented to person, place, and time. She appears well-developed and well-nourished.  HENT:  Head: Normocephalic and atraumatic.  Eyes: Conjunctivae are normal. Pupils are equal, round, and reactive to light.  Neck: Normal range of motion. Neck supple.  Cardiovascular: Normal rate and regular rhythm.   Respiratory: Effort normal and breath sounds normal.  GI: Soft.  Genitourinary: Vagina normal and uterus normal.  Musculoskeletal: Normal range of motion.  Neurological: She is alert and oriented to person, place, and time.  Skin: Skin is warm and dry.  Psychiatric: She has a normal mood and affect. Her behavior is normal. Judgment and thought content normal.    Prenatal labs: ABO, Rh: A/POS/-- (07/17 1119) Antibody: NEG (07/17 1119) Rubella: 1.95 (07/17 1119) RPR: NON REAC (09/24 1201)  HBsAg: NEGATIVE (07/17 1119)  HIV: NON REACTIVE (09/24 1201)  GBS: POSITIVE (12/23 1614)   Assessment/Plan: 39 weeks.  Early labor.  Admit.   HARPER,CHARLES A 10/07/2013, 1:34 PM

## 2013-10-08 ENCOUNTER — Encounter (HOSPITAL_COMMUNITY)
Admission: AD | Disposition: A | Discharge status: Home / Self Care | Payer: Self-pay | Source: Ambulatory Visit | Attending: Obstetrics & Gynecology

## 2013-10-08 ENCOUNTER — Encounter (HOSPITAL_COMMUNITY): Payer: Self-pay | Admitting: *Deleted

## 2013-10-08 ENCOUNTER — Encounter (HOSPITAL_COMMUNITY): Payer: Medicaid Other | Admitting: Anesthesiology

## 2013-10-08 ENCOUNTER — Inpatient Hospital Stay (HOSPITAL_COMMUNITY): Payer: Medicaid Other | Admitting: Anesthesiology

## 2013-10-08 DIAGNOSIS — O368390 Maternal care for abnormalities of the fetal heart rate or rhythm, unspecified trimester, not applicable or unspecified: Secondary | ICD-10-CM | POA: Clinically undetermined

## 2013-10-08 SURGERY — Surgical Case
Anesthesia: Epidural | Site: Abdomen

## 2013-10-08 MED ORDER — ONDANSETRON HCL 4 MG/2ML IJ SOLN: 4.0000 mg | Freq: Four times a day (QID) | INTRAMUSCULAR | Status: DC | PRN

## 2013-10-08 MED ORDER — PHENYLEPHRINE 40 MCG/ML (10ML) SYRINGE FOR IV PUSH (FOR BLOOD PRESSURE SUPPORT)
PREFILLED_SYRINGE | INTRAVENOUS | Status: AC
  Filled 2013-10-08: qty 5

## 2013-10-08 MED ORDER — MAGNESIUM HYDROXIDE 400 MG/5ML PO SUSP
30.0000 mL | ORAL | Status: DC | PRN
Start: 2013-10-08 — End: 2013-10-10

## 2013-10-08 MED ORDER — ONDANSETRON HCL 4 MG/2ML IJ SOLN
INTRAMUSCULAR | Status: DC | PRN
  Administered 2013-10-08: 4 mg via INTRAVENOUS

## 2013-10-08 MED ORDER — OXYTOCIN 40 UNITS IN LACTATED RINGERS INFUSION - SIMPLE MED: 62.5000 mL/h | INTRAVENOUS | Status: AC

## 2013-10-08 MED ORDER — HYDROMORPHONE HCL PF 1 MG/ML IJ SOLN
INTRAMUSCULAR | Status: AC
  Administered 2013-10-08: 0.5 mg via INTRAVENOUS
  Filled 2013-10-08: qty 1

## 2013-10-08 MED ORDER — KETOROLAC TROMETHAMINE 30 MG/ML IJ SOLN: 30.0000 mg | Freq: Four times a day (QID) | INTRAMUSCULAR | Status: DC | PRN

## 2013-10-08 MED ORDER — SCOPOLAMINE 1 MG/3DAYS TD PT72
MEDICATED_PATCH | TRANSDERMAL | Status: AC
  Filled 2013-10-08: qty 1

## 2013-10-08 MED ORDER — PRENATAL MULTIVITAMIN CH
1.0000 | ORAL_TABLET | Freq: Every day | ORAL | Status: DC
  Administered 2013-10-09: 1 via ORAL
  Filled 2013-10-08: qty 1

## 2013-10-08 MED ORDER — OXYTOCIN 10 UNIT/ML IJ SOLN
INTRAMUSCULAR | Status: AC
  Filled 2013-10-08: qty 4

## 2013-10-08 MED ORDER — LACTATED RINGERS IV SOLN
INTRAVENOUS | Status: DC | PRN
  Administered 2013-10-08: 17:00:00 via INTRAVENOUS

## 2013-10-08 MED ORDER — OXYTOCIN 40 UNITS IN LACTATED RINGERS INFUSION - SIMPLE MED
1.0000 m[IU]/min | INTRAVENOUS | Status: DC
  Administered 2013-10-08: 1 m[IU]/min via INTRAVENOUS

## 2013-10-08 MED ORDER — DIBUCAINE 1 % RE OINT: 1.0000 "application " | TOPICAL_OINTMENT | RECTAL | Status: DC | PRN

## 2013-10-08 MED ORDER — MEASLES, MUMPS & RUBELLA VAC ~~LOC~~ INJ
0.5000 mL | INJECTION | Freq: Once | SUBCUTANEOUS | Status: DC
  Filled 2013-10-08: qty 0.5

## 2013-10-08 MED ORDER — LIDOCAINE HCL (PF) 1 % IJ SOLN: 30.0000 mL | INTRAMUSCULAR | Status: DC | PRN

## 2013-10-08 MED ORDER — MORPHINE SULFATE 0.5 MG/ML IJ SOLN
INTRAMUSCULAR | Status: AC
  Filled 2013-10-08: qty 10

## 2013-10-08 MED ORDER — ONDANSETRON HCL 4 MG/2ML IJ SOLN
4.0000 mg | INTRAMUSCULAR | Status: DC | PRN
  Administered 2013-10-08: 4 mg via INTRAVENOUS
  Filled 2013-10-08: qty 2

## 2013-10-08 MED ORDER — MEPERIDINE HCL 25 MG/ML IJ SOLN
INTRAMUSCULAR | Status: DC | PRN
  Administered 2013-10-08 (×2): 12.5 mg via INTRAVENOUS

## 2013-10-08 MED ORDER — KETOROLAC TROMETHAMINE 30 MG/ML IJ SOLN: 15.0000 mg | Freq: Once | INTRAMUSCULAR | Status: DC | PRN

## 2013-10-08 MED ORDER — ACETAMINOPHEN 325 MG PO TABS: 650.0000 mg | ORAL_TABLET | ORAL | Status: DC | PRN

## 2013-10-08 MED ORDER — LIDOCAINE-EPINEPHRINE (PF) 2 %-1:200000 IJ SOLN
INTRAMUSCULAR | Status: AC
  Filled 2013-10-08: qty 20

## 2013-10-08 MED ORDER — MEPERIDINE HCL 25 MG/ML IJ SOLN: 6.2500 mg | INTRAMUSCULAR | Status: DC | PRN

## 2013-10-08 MED ORDER — ONDANSETRON HCL 4 MG PO TABS: 4.0000 mg | ORAL_TABLET | ORAL | Status: DC | PRN

## 2013-10-08 MED ORDER — MEPERIDINE HCL 25 MG/ML IJ SOLN
INTRAMUSCULAR | Status: AC
  Filled 2013-10-08: qty 1

## 2013-10-08 MED ORDER — PHENYLEPHRINE HCL 10 MG/ML IJ SOLN
INTRAMUSCULAR | Status: DC | PRN
  Administered 2013-10-08 (×3): 80 ug via INTRAVENOUS
  Administered 2013-10-08: 120 ug via INTRAVENOUS
  Administered 2013-10-08: 40 ug via INTRAVENOUS
  Administered 2013-10-08: 80 ug via INTRAVENOUS

## 2013-10-08 MED ORDER — FENTANYL CITRATE 0.05 MG/ML IJ SOLN
INTRAMUSCULAR | Status: AC
  Filled 2013-10-08: qty 2

## 2013-10-08 MED ORDER — KETOROLAC TROMETHAMINE 30 MG/ML IJ SOLN
INTRAMUSCULAR | Status: AC
  Administered 2013-10-08: 30 mg via INTRAMUSCULAR
  Filled 2013-10-08: qty 1

## 2013-10-08 MED ORDER — OXYTOCIN 10 UNIT/ML IJ SOLN
40.0000 [IU] | INTRAVENOUS | Status: DC | PRN
  Administered 2013-10-08: 40 [IU] via INTRAVENOUS

## 2013-10-08 MED ORDER — WITCH HAZEL-GLYCERIN EX PADS: 1.0000 "application " | MEDICATED_PAD | CUTANEOUS | Status: DC | PRN

## 2013-10-08 MED ORDER — SODIUM BICARBONATE 8.4 % IV SOLN
INTRAVENOUS | Status: DC | PRN
  Administered 2013-10-08: 6 mL via EPIDURAL
  Administered 2013-10-08: 10 mL via EPIDURAL
  Administered 2013-10-08: 2 mL via EPIDURAL
  Administered 2013-10-08: 4 mL via EPIDURAL
  Administered 2013-10-08: 5 mL via EPIDURAL

## 2013-10-08 MED ORDER — ONDANSETRON HCL 4 MG/2ML IJ SOLN
INTRAMUSCULAR | Status: AC
  Filled 2013-10-08: qty 2

## 2013-10-08 MED ORDER — LACTATED RINGERS IV SOLN
INTRAVENOUS | Status: DC | PRN
  Administered 2013-10-08 (×3): via INTRAVENOUS

## 2013-10-08 MED ORDER — METOCLOPRAMIDE HCL 5 MG/ML IJ SOLN
INTRAMUSCULAR | Status: DC | PRN
  Administered 2013-10-08: 10 mg via INTRAVENOUS

## 2013-10-08 MED ORDER — SCOPOLAMINE 1 MG/3DAYS TD PT72
1.0000 | MEDICATED_PATCH | Freq: Once | TRANSDERMAL | Status: DC
  Administered 2013-10-08: 1.5 mg via TRANSDERMAL

## 2013-10-08 MED ORDER — KETOROLAC TROMETHAMINE 30 MG/ML IJ SOLN
30.0000 mg | Freq: Four times a day (QID) | INTRAMUSCULAR | Status: DC | PRN
  Administered 2013-10-08: 30 mg via INTRAMUSCULAR

## 2013-10-08 MED ORDER — IBUPROFEN 600 MG PO TABS
600.0000 mg | ORAL_TABLET | Freq: Four times a day (QID) | ORAL | Status: DC
Start: 2013-10-08 — End: 2013-10-10
  Administered 2013-10-09 – 2013-10-10 (×6): 600 mg via ORAL
  Filled 2013-10-08 (×6): qty 1

## 2013-10-08 MED ORDER — FENTANYL CITRATE 0.05 MG/ML IJ SOLN
INTRAMUSCULAR | Status: DC | PRN
  Administered 2013-10-08: 100 ug via INTRAVENOUS

## 2013-10-08 MED ORDER — LACTATED RINGERS IV SOLN: 500.0000 mL | INTRAVENOUS | Status: DC | PRN

## 2013-10-08 MED ORDER — NALBUPHINE HCL 10 MG/ML IJ SOLN: 10.0000 mg | Freq: Four times a day (QID) | INTRAMUSCULAR | Status: DC | PRN

## 2013-10-08 MED ORDER — ZOLPIDEM TARTRATE 5 MG PO TABS: 5.0000 mg | ORAL_TABLET | Freq: Every evening | ORAL | Status: DC | PRN

## 2013-10-08 MED ORDER — OXYTOCIN 40 UNITS IN LACTATED RINGERS INFUSION - SIMPLE MED: 62.5000 mL/h | INTRAVENOUS | Status: DC

## 2013-10-08 MED ORDER — SODIUM BICARBONATE 8.4 % IV SOLN
INTRAVENOUS | Status: AC
  Filled 2013-10-08: qty 50

## 2013-10-08 MED ORDER — SIMETHICONE 80 MG PO CHEW: 80.0000 mg | CHEWABLE_TABLET | ORAL | Status: DC | PRN

## 2013-10-08 MED ORDER — FERROUS SULFATE 325 (65 FE) MG PO TABS
325.0000 mg | ORAL_TABLET | Freq: Two times a day (BID) | ORAL | Status: DC
  Administered 2013-10-09 – 2013-10-10 (×3): 325 mg via ORAL
  Filled 2013-10-08 (×3): qty 1

## 2013-10-08 MED ORDER — DIPHENHYDRAMINE HCL 25 MG PO CAPS: 25.0000 mg | ORAL_CAPSULE | Freq: Four times a day (QID) | ORAL | Status: DC | PRN

## 2013-10-08 MED ORDER — TETANUS-DIPHTH-ACELL PERTUSSIS 5-2.5-18.5 LF-MCG/0.5 IM SUSP
0.5000 mL | Freq: Once | INTRAMUSCULAR | Status: AC
  Administered 2013-10-10: 0.5 mL via INTRAMUSCULAR
  Filled 2013-10-08: qty 0.5

## 2013-10-08 MED ORDER — MORPHINE SULFATE (PF) 0.5 MG/ML IJ SOLN
INTRAMUSCULAR | Status: DC | PRN
  Administered 2013-10-08: 1 mg via INTRAVENOUS
  Administered 2013-10-08: 4 mg via EPIDURAL

## 2013-10-08 MED ORDER — OXYCODONE-ACETAMINOPHEN 5-325 MG PO TABS: 1.0000 | ORAL_TABLET | ORAL | Status: DC | PRN

## 2013-10-08 MED ORDER — LACTATED RINGERS IV SOLN
INTRAVENOUS | Status: DC
  Administered 2013-10-09: 05:00:00 via INTRAVENOUS

## 2013-10-08 MED ORDER — LIDOCAINE HCL (PF) 1 % IJ SOLN
INTRAMUSCULAR | Status: DC | PRN
  Administered 2013-10-08 (×3): 4 mL

## 2013-10-08 MED ORDER — NALBUPHINE HCL 10 MG/ML IJ SOLN: 10.0000 mg | INTRAMUSCULAR | Status: DC | PRN

## 2013-10-08 MED ORDER — SENNOSIDES-DOCUSATE SODIUM 8.6-50 MG PO TABS
2.0000 | ORAL_TABLET | ORAL | Status: DC
Start: 2013-10-09 — End: 2013-10-10
  Administered 2013-10-09: 2 via ORAL
  Filled 2013-10-08 (×2): qty 2

## 2013-10-08 MED ORDER — IBUPROFEN 600 MG PO TABS: 600.0000 mg | ORAL_TABLET | Freq: Four times a day (QID) | ORAL | Status: DC | PRN

## 2013-10-08 MED ORDER — HYDROMORPHONE HCL PF 1 MG/ML IJ SOLN
0.2500 mg | INTRAMUSCULAR | Status: DC | PRN
  Administered 2013-10-08 (×2): 0.5 mg via INTRAVENOUS

## 2013-10-08 MED ORDER — CITRIC ACID-SODIUM CITRATE 334-500 MG/5ML PO SOLN
30.0000 mL | ORAL | Status: DC | PRN
  Filled 2013-10-08: qty 15

## 2013-10-08 MED ORDER — OXYTOCIN BOLUS FROM INFUSION: 500.0000 mL | INTRAVENOUS | Status: DC

## 2013-10-08 MED ORDER — SIMETHICONE 80 MG PO CHEW
80.0000 mg | CHEWABLE_TABLET | ORAL | Status: DC
  Administered 2013-10-09 (×2): 80 mg via ORAL
  Filled 2013-10-08 (×2): qty 1

## 2013-10-08 MED ORDER — PROMETHAZINE HCL 25 MG/ML IJ SOLN: 6.2500 mg | INTRAMUSCULAR | Status: DC | PRN

## 2013-10-08 MED ORDER — LANOLIN HYDROUS EX OINT: 1.0000 "application " | TOPICAL_OINTMENT | CUTANEOUS | Status: DC | PRN

## 2013-10-08 MED ORDER — DEXTROSE 5 % IV SOLN
500.0000 mg | Freq: Once | INTRAVENOUS | Status: AC
  Administered 2013-10-08: 500 mg via INTRAVENOUS
  Filled 2013-10-08: qty 500

## 2013-10-08 SURGICAL SUPPLY — 40 items
BENZOIN TINCTURE PRP APPL 2/3 (GAUZE/BANDAGES/DRESSINGS) ×2 IMPLANT
CANISTER WOUND CARE 500ML ATS (WOUND CARE) IMPLANT
CLAMP CORD UMBIL (MISCELLANEOUS) IMPLANT
CLOTH BEACON ORANGE TIMEOUT ST (SAFETY) ×2 IMPLANT
CONTAINER PREFILL 10% NBF 15ML (MISCELLANEOUS) IMPLANT
DRAPE LG THREE QUARTER DISP (DRAPES) IMPLANT
DRSG OPSITE POSTOP 4X10 (GAUZE/BANDAGES/DRESSINGS) ×2 IMPLANT
DRSG VAC ATS LRG SENSATRAC (GAUZE/BANDAGES/DRESSINGS) IMPLANT
DRSG VAC ATS MED SENSATRAC (GAUZE/BANDAGES/DRESSINGS) IMPLANT
DRSG VAC ATS SM SENSATRAC (GAUZE/BANDAGES/DRESSINGS) IMPLANT
DURAPREP 26ML APPLICATOR (WOUND CARE) ×2 IMPLANT
ELECT REM PT RETURN 9FT ADLT (ELECTROSURGICAL) ×2
ELECTRODE REM PT RTRN 9FT ADLT (ELECTROSURGICAL) ×1 IMPLANT
EXTRACTOR VACUUM M CUP 4 TUBE (SUCTIONS) ×2 IMPLANT
GLOVE BIO SURGEON STRL SZ 6.5 (GLOVE) ×2 IMPLANT
GOWN PREVENTION PLUS XLARGE (GOWN DISPOSABLE) ×4 IMPLANT
GOWN STRL REIN XL XLG (GOWN DISPOSABLE) ×4 IMPLANT
KIT ABG SYR 3ML LUER SLIP (SYRINGE) ×2 IMPLANT
NEEDLE HYPO 25X5/8 SAFETYGLIDE (NEEDLE) ×2 IMPLANT
NS IRRIG 1000ML POUR BTL (IV SOLUTION) ×2 IMPLANT
PACK C SECTION WH (CUSTOM PROCEDURE TRAY) ×2 IMPLANT
PAD OB MATERNITY 4.3X12.25 (PERSONAL CARE ITEMS) ×2 IMPLANT
RTRCTR C-SECT PINK 25CM LRG (MISCELLANEOUS) ×2 IMPLANT
SCRUB PCMX 4 OZ (MISCELLANEOUS) ×2 IMPLANT
STAPLER VISISTAT 35W (STAPLE) IMPLANT
STRIP CLOSURE SKIN 1/2X4 (GAUZE/BANDAGES/DRESSINGS) ×2 IMPLANT
SUT MNCRL 0 VIOLET CTX 36 (SUTURE) ×2 IMPLANT
SUT MNCRL AB 3-0 PS2 27 (SUTURE) IMPLANT
SUT MONOCRYL 0 CTX 36 (SUTURE) ×2
SUT PDS AB 0 CTX 36 PDP370T (SUTURE) ×2 IMPLANT
SUT PLAIN 0 NONE (SUTURE) IMPLANT
SUT VIC AB 0 CTXB 36 (SUTURE) IMPLANT
SUT VIC AB 2-0 CT1 (SUTURE) ×2 IMPLANT
SUT VIC AB 2-0 CT1 27 (SUTURE) ×2
SUT VIC AB 2-0 CT1 TAPERPNT 27 (SUTURE) ×1 IMPLANT
SUT VIC AB 2-0 SH 27 (SUTURE)
SUT VIC AB 2-0 SH 27XBRD (SUTURE) IMPLANT
TOWEL OR 17X24 6PK STRL BLUE (TOWEL DISPOSABLE) ×2 IMPLANT
TRAY FOLEY CATH 14FR (SET/KITS/TRAYS/PACK) ×2 IMPLANT
WATER STERILE IRR 1000ML POUR (IV SOLUTION) ×2 IMPLANT

## 2013-10-08 NOTE — Anesthesia Procedure Notes (Signed)
Epidural Patient location during procedure: OB Start time: 10/08/2013 2:10 AM  Staffing Performed by: anesthesiologist   Preanesthetic Checklist Completed: patient identified, site marked, surgical consent, pre-op evaluation, timeout performed, IV checked, risks and benefits discussed and monitors and equipment checked  Epidural Patient position: sitting Prep: site prepped and draped and DuraPrep Patient monitoring: continuous pulse ox and blood pressure Approach: midline Injection technique: LOR air  Needle:  Needle type: Tuohy  Needle gauge: 17 G Needle length: 9 cm and 9 Needle insertion depth: 8.5 cm Catheter type: closed end flexible Catheter size: 19 Gauge Catheter at skin depth: 13.5 cm Test dose: negative  Assessment Events: blood not aspirated, injection not painful, no injection resistance, negative IV test and no paresthesia  Additional Notes Discussed risk of headache, infection, bleeding, nerve injury and failed or incomplete block.  Patient voices understanding and wishes to proceed.  Epidural placed easily on first attempt.  No paresthesia.  Patient tolerated procedure well with no apparent complications.  A> Yekaterina Escutia, MDReason for block:procedure for pain

## 2013-10-08 NOTE — Op Note (Signed)
Cesarean Section Procedure Note   Suzanne [name]   10/07/2013 - 10/08/2013  Indications: Protracted active phase, Category II FHT with repetitive, prolonged decelerations   Pre-operative Diagnosis: Protracted active phase, Category II FHT with repetitive, prolonged decelerations  Post-operative Diagnosis: Same  Surgeon: Antionette Char A  Assistants: none  Anesthesia: epidural  Procedure Details:  The patient was seen in the Holding Room. The risks, benefits, complications, treatment options, and expected outcomes were discussed with the patient. The patient concurred with the proposed plan, giving informed consent. The patient was identified as Suzanne [name] and the procedure verified as C-Section Delivery. A Time Out was held and the above information confirmed.  After induction of anesthesia, the patient was draped and prepped in the usual sterile manner. A transverse incision was made and carried down through the subcutaneous tissue to the fascia. The fascial incision was made and extended transversely. The fascia was separated from the underlying rectus tissue superiorly. The peritoneum was identified and entered. The peritoneal incision was extended longitudinally. The utero-vesical peritoneal reflection was incised transversely and the bladder flap was bluntly freed from the lower uterine segment. A low transverse uterine incision was made. Delivered from cephalic presentation was a living newborn female infant. APGAR (1 MIN): 7   APGAR (5 MINS): 8      A cord ph was sent. The umbilical cord was clamped and cut cord. A sample was obtained for evaluation. The placenta was removed Intact and was meconium stained.  The uterine incision was closed with running locked sutures of 1-0 Monocryl. A second imbricating layer of the same suture was placed. A bladder flap hematoma was noted on the left side.  This was evacuated.  There was bleeding from the left margin of the incision.  O'Leary  sutures were placed.   Hemostasis was observed. The paracolic gutters were irrigated. The parieto peritoneum was closed in a running fashion with 2-0 Vicryl.  The fascia was then reapproximated with running sutures of 0 PDS.  A running suture of 2-0 Vicryl was placed in the subcutaneous layer.  The skin was closed with suture.  Instrument, sponge, and needle counts were correct prior the abdominal closure and were correct at the conclusion of the case.    Findings:  Thick meconium.   Estimated Blood Loss: 1 L  Total IV Fluids: per Anesthesiology   Urine Output: per Anesthesiology   Specimens: Placenta  Complications: see above, bladder flap hematoma  Disposition: PACU - hemodynamically stable.  Maternal Condition: stable   Baby condition / location:  Nursery    Signed: Surgeon(s): Antionette Char, MD

## 2013-10-08 NOTE — Progress Notes (Signed)
Suzanne [name] is [name] 28 y.o. G1P0 at [redacted]w[redacted]d by LMP admitted for active labor  Subjective: C/O pressure  Objective: BP 152/85  Pulse 106  Temp(Src) 98 F (36.7 C) (Oral)  Resp 20  Ht 4\' 11"  (1.499 m)  Wt 117.482 kg (259 lb)  BMI 52.28 kg/m2  SpO2 98%  LMP 01/01/2013 I/O last 3 completed shifts: In: -  Out: 200 [Emesis/NG output:200] Total I/O In: -  Out: 550 [Urine:550]  FHT:  FHR: 140 bpm, variability: moderate,  accelerations:  Present,  decelerations:  Present repetitive, prolonged decelerations UC:   irregular, every 5 minutes SVE:   Dilation: 9 Effacement (%): 100 Station: 0;+1 Exam by:: r. gagnon,rnc  Labs: Lab Results  Component Value Date   WBC 11.0* 10/07/2013   HGB 10.2* 10/07/2013   HCT 31.5* 10/07/2013   MCV 72.4* 10/07/2013   PLT 262 10/07/2013    Assessment / Plan: Protracted active phase, Category II FHT  Labor: see above Preeclampsia:  n/[name] Fetal Wellbeing: see above Pain Control:  Epidural I/D:  n/[name] Anticipated MOD:  C/D; consent obtained  Suzanne [name] 10/08/2013, 3:45 PM

## 2013-10-08 NOTE — Anesthesia Preprocedure Evaluation (Signed)
Anesthesia Evaluation  Patient identified by MRN, date of birth, ID band Patient awake    Reviewed: Allergy & Precautions, H&P , NPO status , Patient's Chart, lab work & pertinent test results, reviewed documented beta blocker date and time   History of Anesthesia Complications Negative for: history of anesthetic complications  Airway Mallampati: III TM Distance: >3 FB Neck ROM: full    Dental  (+) Teeth Intact   Pulmonary neg pulmonary ROS,  breath sounds clear to auscultation        Cardiovascular negative cardio ROS  Rhythm:regular Rate:Normal     Neuro/Psych negative neurological ROS  negative psych ROS   GI/Hepatic Neg liver ROS, GERD-  Medicated,  Endo/Other  Morbid obesity  Renal/GU negative Renal ROS     Musculoskeletal   Abdominal   Peds  Hematology  (+) Sickle cell trait ,   Anesthesia Other Findings   Reproductive/Obstetrics (+) Pregnancy                           Anesthesia Physical Anesthesia Plan  ASA: III  Anesthesia Plan: Epidural   Post-op Pain Management:    Induction:   Airway Management Planned:   Additional Equipment:   Intra-op Plan:   Post-operative Plan:   Informed Consent: I have reviewed the patients History and Physical, chart, labs and discussed the procedure including the risks, benefits and alternatives for the proposed anesthesia with the patient or authorized representative who has indicated his/her understanding and acceptance.     Plan Discussed with:   Anesthesia Plan Comments:         Anesthesia Quick Evaluation

## 2013-10-08 NOTE — Progress Notes (Signed)
Suzanne [name] is a 28 y.o. G1P0 at [redacted]w[redacted]d by LMP admitted for active labor  Subjective:   Objective: BP 105/50  Pulse 100  Temp(Src) 97.4 F (36.3 C) (Oral)  Resp 20  Ht 4\' 11"  (1.499 m)  Wt 259 lb (117.482 kg)  BMI 52.28 kg/m2  SpO2 98%  LMP 01/01/2013 I/O last 3 completed shifts: In: -  Out: 200 [Emesis/NG output:200]    FHT:  FHR: 150 bpm, variability: moderate,  accelerations:  Present,  decelerations:  Absent UC:   regular, every 3-5 minutes SVE:   Dilation: 4 Effacement (%): 90 Station: -1 Exam by:: e. poore, rn  Labs: Lab Results  Component Value Date   WBC 11.0* 10/07/2013   HGB 10.2* 10/07/2013   HCT 31.5* 10/07/2013   MCV 72.4* 10/07/2013   PLT 262 10/07/2013    Assessment / Plan: Augmentation of labor, progressing well.  Continue low dose pitocin per protocol.  Labor: Progressing on Pitocin, will continue to increase then AROM Preeclampsia:  n/a Fetal Wellbeing:  Category I Pain Control:  Epidural I/D:  n/a Anticipated MOD:  NSVD  HARPER,CHARLES A 10/08/2013, 6:02 AM

## 2013-10-08 NOTE — Transfer of Care (Signed)
Immediate Anesthesia Transfer of Care Note  Patient: Suzanne [name]  Procedure(s) Performed: Procedure(s): CESAREAN SECTION (N/A)  Patient Location: PACU  Anesthesia Type:Epidural  Level of Consciousness: awake, alert  and oriented  Airway & Oxygen Therapy: Patient Spontanous Breathing  Post-op Assessment: Report given to PACU RN and Post -op Vital signs reviewed and stable  Post vital signs: Reviewed and stable  Complications: No apparent anesthesia complications

## 2013-10-08 NOTE — Anesthesia Postprocedure Evaluation (Signed)
Anesthesia Post Note  Patient: Suzanne [name]  Procedure(s) Performed: Procedure(s) (LRB): CESAREAN SECTION (N/A)  Anesthesia type: Epidural  Patient location: PACU  Post pain: Pain level controlled  Post assessment: Post-op Vital signs reviewed  Last Vitals:  Filed Vitals:   10/08/13 1530  BP: 152/85  Pulse:   Temp:   Resp: 20    Post vital signs: Reviewed  Level of consciousness: awake  Complications: No apparent anesthesia complications

## 2013-10-09 ENCOUNTER — Encounter (HOSPITAL_COMMUNITY): Payer: Self-pay | Admitting: Obstetrics & Gynecology

## 2013-10-09 LAB — CBC
HCT: 22.1 % — ABNORMAL LOW (ref 36.0–46.0)
HCT: 22.6 % — ABNORMAL LOW (ref 36.0–46.0)
Hemoglobin: 7.2 g/dL — ABNORMAL LOW (ref 12.0–15.0)
Hemoglobin: 7.3 g/dL — ABNORMAL LOW (ref 12.0–15.0)
MCH: 23.5 pg — ABNORMAL LOW (ref 26.0–34.0)
MCH: 23.7 pg — ABNORMAL LOW (ref 26.0–34.0)
MCHC: 32.3 g/dL (ref 30.0–36.0)
MCHC: 32.6 g/dL (ref 30.0–36.0)
MCV: 72.7 fL — ABNORMAL LOW (ref 78.0–100.0)
MCV: 72.7 fL — ABNORMAL LOW (ref 78.0–100.0)
Platelets: 219 10*3/uL (ref 150–400)
Platelets: 257 10*3/uL (ref 150–400)
RBC: 3.04 MIL/uL — ABNORMAL LOW (ref 3.87–5.11)
RBC: 3.11 MIL/uL — ABNORMAL LOW (ref 3.87–5.11)
RDW: 16.4 % — ABNORMAL HIGH (ref 11.5–15.5)
RDW: 16.4 % — ABNORMAL HIGH (ref 11.5–15.5)
WBC: 12.5 10*3/uL — ABNORMAL HIGH (ref 4.0–10.5)
WBC: 14 10*3/uL — ABNORMAL HIGH (ref 4.0–10.5)

## 2013-10-09 LAB — BASIC METABOLIC PANEL
BUN: 8 mg/dL (ref 6–23)
CO2: 25 mEq/L (ref 19–32)
Calcium: 8.7 mg/dL (ref 8.4–10.5)
Chloride: 104 mEq/L (ref 96–112)
Creatinine, Ser: 0.76 mg/dL (ref 0.50–1.10)
GFR calc Af Amer: >90 mL/min (ref >90)
GFR calc non Af Amer: >90 mL/min (ref >90)
Glucose, Bld: 130 mg/dL — ABNORMAL HIGH (ref 70–99)
Potassium: 4 mEq/L (ref 3.7–5.3)
Sodium: 138 mEq/L (ref 137–147)

## 2013-10-09 MED ORDER — IBUPROFEN 600 MG PO TABS: 600.0000 mg | ORAL_TABLET | Freq: Four times a day (QID) | ORAL | Status: DC | PRN

## 2013-10-09 MED ORDER — NALOXONE HCL 0.4 MG/ML IJ SOLN: 0.4000 mg | INTRAMUSCULAR | Status: DC | PRN

## 2013-10-09 MED ORDER — NALOXONE HCL 1 MG/ML IJ SOLN
1.0000 ug/kg/h | INTRAVENOUS | Status: DC | PRN
  Filled 2013-10-09: qty 2

## 2013-10-09 MED ORDER — SODIUM CHLORIDE 0.9 % IJ SOLN: 3.0000 mL | INTRAMUSCULAR | Status: DC | PRN

## 2013-10-09 MED ORDER — DIPHENHYDRAMINE HCL 25 MG PO CAPS: 25.0000 mg | ORAL_CAPSULE | ORAL | Status: DC | PRN

## 2013-10-09 MED ORDER — ONDANSETRON HCL 4 MG/2ML IJ SOLN: 4.0000 mg | Freq: Three times a day (TID) | INTRAMUSCULAR | Status: DC | PRN

## 2013-10-09 MED ORDER — DIPHENHYDRAMINE HCL 50 MG/ML IJ SOLN: 25.0000 mg | INTRAMUSCULAR | Status: DC | PRN

## 2013-10-09 MED ORDER — DIPHENHYDRAMINE HCL 50 MG/ML IJ SOLN
12.5000 mg | INTRAMUSCULAR | Status: DC | PRN
Start: 2013-10-09 — End: 2013-10-10

## 2013-10-09 MED ORDER — NALBUPHINE HCL 10 MG/ML IJ SOLN: 5.0000 mg | INTRAMUSCULAR | Status: DC | PRN

## 2013-10-09 MED ORDER — METOCLOPRAMIDE HCL 5 MG/ML IJ SOLN: 10.0000 mg | Freq: Three times a day (TID) | INTRAMUSCULAR | Status: DC | PRN

## 2013-10-09 NOTE — Progress Notes (Deleted)
Patient ID: Suzanne [name], female   DOB: Mar 22, 1986, 28 y.o.   MRN: [id number] Post Partum Day 1 S/P spontaneous vaginal RH status/Rubella reviewed.  Feeding: breast Subjective: No HA, SOB, CP, F/C, breast symptoms. Normal vaginal bleeding, no clots.     Objective: BP 114/75  Pulse 100  Temp(Src) 98.2 F (36.8 C) (Oral)  Resp 18  Ht 4\' 11"  (1.499 m)  Wt 117.482 kg (259 lb)  BMI 52.28 kg/m2  SpO2 98%  LMP 01/01/2013   Physical Exam:  General: alert Lochia: appropriate Uterine Fundus: firm DVT Evaluation: No evidence of DVT seen on physical exam. Ext: No c/c/e  Recent Labs  10/07/13 1322 10/08/13 2351  HGB 10.2* 7.3*  HCT 31.5* 22.6*      Assessment/Plan: 28 y.o.  PPD #1 .  normal postpartum exam Continue current postpartum care Ambulate   LOS: 2 days   JACKSON-MOORE,Abbie Berling A 10/09/2013, 6:58 AM

## 2013-10-09 NOTE — Anesthesia Postprocedure Evaluation (Signed)
Anesthesia Post Note  Patient: Suzanne [name]  Procedure(s) Performed: Procedure(s) (LRB): CESAREAN SECTION (N/A)  Anesthesia type: Epidural  Patient location: Mother/Baby  Post pain: Pain level controlled  Post assessment: Post-op Vital signs reviewed  Last Vitals:  Filed Vitals:   10/09/13 1030  BP: 107/73  Pulse: 104  Temp: 36.8 C  Resp: 18    Post vital signs: Reviewed  Level of consciousness:alert  Complications: No apparent anesthesia complications

## 2013-10-09 NOTE — Progress Notes (Signed)
UR chart review completed.  

## 2013-10-09 NOTE — Progress Notes (Signed)
Patient ID: Suzanne [name], female   DOB: 1986-05-02, 28 y.o.   MRN: [id number] Subjective: POD# 1 s/p Cesarean Delivery.  Indications: failure to progress and fetal distress  RH status/Rubella reviewed. Feeding: unknown Patient reports tolerating PO.  Denies HA/SOB/C/P/N/V/dizziness.   Breast symptoms: no.  She reports vaginal bleeding as normal, without clots.  She is ambulating.     Objective: Vital signs in last 24 hours: BP 114/75  Pulse 100  Temp(Src) 98.2 F (36.8 C) (Oral)  Resp 18  Ht 4\' 11"  (1.499 m)  Wt 117.482 kg (259 lb)  BMI 52.28 kg/m2  SpO2 98%  LMP 01/01/2013       Physical Exam:  General: alert CV: Regular rate and rhythm Resp: clear Abdomen: soft, nontender, normal bowel sounds Lochia: minimal Uterine Fundus: firm, below umbilicus, nontender Incision: clean, dry and intact Ext: extremities normal, atraumatic, no cyanosis or edema    Recent Labs  10/08/13 2351 10/09/13 0655  HGB 7.3* 7.2*  HCT 22.6* 22.1*      Assessment/Plan: 28 y.o.  status post Cesarean section. POD# 1.   Doing well, stable. Anemia c/w intraoperative blood loss             Advance diet as tolerated Start po pain meds D/C foley  HLIV  Ambulate IS Routine post-op care  JACKSON-MOORE,Carsen Machi A 10/09/2013, 9:01 AM

## 2013-10-10 MED ORDER — FUSION PLUS PO CAPS: 1.0000 | ORAL_CAPSULE | Freq: Every day | ORAL | Status: DC

## 2013-10-10 MED ORDER — IBUPROFEN 600 MG PO TABS: 600.0000 mg | ORAL_TABLET | Freq: Four times a day (QID) | ORAL | Status: DC | PRN

## 2013-10-10 MED ORDER — OXYCODONE-ACETAMINOPHEN 5-325 MG PO TABS: 1.0000 | ORAL_TABLET | ORAL | Status: DC | PRN

## 2013-10-10 NOTE — Discharge Instructions (Signed)
Before [facility] Ask any questions about feeding, diapering, and baby care before you leave the hospital. Ask again if you do not understand. Ask when you need to see the doctor again. There are several things you must have before your baby comes home.  Infant car seat.  Crib.  Do not let your baby sleep in a bed with you or anyone else.  If you do not have a bed for your baby, ask the doctor what you can use that will be safe for the baby to sleep in. Infant feeding supplies:  6 to 8 bottles (8 oz. size).  6 to 8 nipples.  Measuring cup.  Measuring tablespoon.  Bottle brush.  Sterilizer (or use any large pan or kettle with a lid).  Formula that contains iron.  A way to boil and cool water. Breastfeeding supplies:  Breast pump.  Nipple cream. Clothing:  24 to 36 cloth diapers and waterproof diaper covers or a box of disposable diapers. You may need as many as 10 to 12 diapers per day.  3 onesies (other clothing will depend on the time of year and the weather).  3 receiving blankets.  3 baby pajamas or gowns.  3 bibs. Bath equipment:  Mild soap.  Petroleum jelly. No baby oil or powder.  Soft cloth towel and wash cloth.  Cotton balls.  Separate bath basin for baby. Only sponge bathe until umbilical cord and circumcision are healed. Other supplies:  Thermometer and bulb syringe (ask the hospital to send them home with you). Ask your doctor about how you should take your baby's temperature.  One to two pacifiers. Prepare for an emergency:  Know how to get to the hospital and know where to admit your baby.  Put all doctor numbers near your house phone and in your cell phone if you have one. Prepare your family:  Talk with siblings about the baby coming home and how they feel about it.  Decide how you want to handle visitors and other family members.  Take offers for help with the baby. You will need time to adjust. Know when to call the doctor.   GET HELP RIGHT AWAY IF:  Your baby's temperature is greater than 100.4 F (38 C).  The softspot on your baby's head starts to bulge.  Your baby is crying with no tears or has no wet diapers for 6 hours.  Your baby has rapid breathing.  Your baby is not as alert. Document Released: 08/24/2008 Document Revised: 12/04/2011 Document Reviewed: 12/01/2010 [facility] Patient Information 2014 [place], [place].  Cesarean Delivery  Cesarean delivery is the birth of a baby through a cut (incision) in the abdomen and womb (uterus).  LET [facility] CARE PROVIDER KNOW ABOUT:  All medicines you are taking, including vitamins, herbs, eye drops, creams, and over-the-counter medicines.  Previous problems you or members of your family have had with the use of anesthetics.  Any blood disorders you have.  Previous surgeries you have had.  Medical conditions you have.  Any allergies you have.  Complicationsinvolving the pregnancy. RISKS AND COMPLICATIONS  Generally, this is a safe procedure. However, as with any procedure, complications can occur. Possible complications include:  Bleeding.  Infection.  Blood clots.  Injury to surrounding organs.  Problems with anesthesia.  Injury to the baby. BEFORE THE PROCEDURE   You may be given an antacid medicine to drink. This will prevent acid contents in your stomach from going into your lungs if you vomit during  the surgery.  You may be given an antibiotic medicine to prevent infection. PROCEDURE   Hair may be removed from your pubic area and your lower abdomen. This is to prevent infection in the incision site.  A tube (Foley catheter) will be placed in your bladder to drain your urine from your bladder into a bag. This keeps your bladder empty during surgery.  An IV tube will be placed in your vein.  You may be given medicine to numb the lower half of your body (regional anesthetic). If you were in labor, you may have already had an  epidural in place which can be used in both labor and cesarean delivery. You may possibly be given medicine to make you sleep (general anesthetic) though this is not as common.  An incision will be made in your abdomen that extends to your uterus. There are 2 basic kinds of incisions:  The horizontal (transverse) incision. Horizontal incisions are from side to side and are used for most routine cesarean deliveries.  The vertical incision. The vertical incision is from the top of the abdomen to the bottom and is less commonly used. It is often done for women who have a serious complication (extreme prematurity) or under emergency situations.  The horizontal and vertical incisions may both be used at the same time. However, this is very uncommon.  An incision is then made in your uterus to deliver the baby.  Your baby will then be delivered.  Both incisions are then closed with absorbable stitches. AFTER THE PROCEDURE   If you were awake during the surgery, you will see your baby right away. If you were asleep, you will see your baby as soon as you are awake.  You may breastfeed your baby after surgery.  You may be able to get up and walk the same day as the surgery. If you need to stay in bed for a period of time, you will receive help to turn, cough, and take deep breaths after surgery. This helps prevent lung problems such as pneumonia.  Do not get out of bed alone the first time after surgery. You will need help getting out of bed until you are able to do this by yourself.  You may be able to shower the day after your cesarean delivery. After the bandage (dressing) is taken off the incision site, a nurse will assist you to shower if you would like help.  You will have pneumatic compression hose placed on your lower legs. This is done to prevent blood clots. When you are up and walking regularly, they will no longer be necessary.  Do not cross your legs when you sit.  Save any  blood clots that you pass. If you pass a clot while on the toilet, do not flush it. Call for the nurse. Tell the nurse if you think you are bleeding too much or passing too many clots.  You will be given medicine as needed. Let your health care providers know if you are hurting. You may also be given an antibiotic to prevent an infection.  Your IV tube will be taken out when you are drinking a reasonable amount of fluids. The Foley catheter is taken out when you are up and walking.  If your blood type is Rh negative and your baby's blood type is Rh positive, you will be given a shot of anti-D immune globulin. This shot prevents you from having Rh problems with a future pregnancy. You should get  the shot even if you had your tubes tied (tubal ligation).  If you are allowed to take the baby for a walk, place the baby in the bassinet and push it. Do not carry your baby in your arms. Document Released: 09/11/2005 Document Revised: 07/02/2013 Document Reviewed: 04/02/2013 [facility] Patient Information 2014 [place], [place].

## 2013-10-10 NOTE — Discharge Summary (Signed)
Obstetric Discharge Summary Reason for Admission: onset of labor Prenatal Procedures: ultrasound Intrapartum Procedures: cesarean: low cervical, transverse Postpartum Procedures: none Complications-Operative and Postpartum: none Hemoglobin  Date Value Range Status  10/09/2013 7.2* 12.0 - 15.0 g/dL Final     HCT  Date Value Range Status  10/09/2013 22.1* 36.0 - 46.0 % Final    Physical Exam:  General: alert and no distress Lochia: appropriate Uterine Fundus: firm Incision: healing well DVT Evaluation: No evidence of DVT seen on physical exam.  Discharge Diagnoses: Term Pregnancy-delivered  Discharge Information: Date: 10/10/2013 Activity: pelvic rest Diet: routine Medications: PNV, Ibuprofen, Colace, Iron and Percocet Condition: stable Instructions: refer to practice specific booklet Discharge to: home Follow-up Information   Follow up with HARPER,CHARLES A, MD. Schedule an appointment as soon as possible for a visit in 2 weeks.   Specialty:  Obstetrics and Gynecology   Contact information:   [address] [place] [address] [phone]       Newborn Data: Live born female  Birth Weight: 7 lb 12.7 oz (3535 g) APGAR: 7, 8  Home with mother.  HARPER,CHARLES A 10/10/2013, 8:37 AM

## 2013-10-10 NOTE — Progress Notes (Signed)
Subjective: Postpartum Day 2: Cesarean Delivery Patient reports tolerating PO, + flatus and no problems voiding.    Objective: Vital signs in last 24 hours: Temp:  [97.9 F (36.6 C)-98.3 F (36.8 C)] 98 F (36.7 C) (01/15 1835) Pulse Rate:  [100-110] 102 (01/15 1835) Resp:  [18-20] 18 (01/15 1835) BP: (107-125)/(73-75) 118/74 mmHg (01/15 1835) SpO2:  [97 %-99 %] 97 % (01/15 1415)  Physical Exam:  General: alert and no distress Lochia: appropriate Uterine Fundus: firm Incision: healing well DVT Evaluation: No evidence of DVT seen on physical exam.   Recent Labs  10/08/13 2351 10/09/13 0655  HGB 7.3* 7.2*  HCT 22.6* 22.1*    Assessment/Plan: Status post Cesarean section. Doing well postoperatively.  Anemia.  Clinically stable. Continue current care.  Morey Andonian A 10/10/2013, 5:32 AM

## 2013-10-12 ENCOUNTER — Encounter (HOSPITAL_COMMUNITY): Payer: Self-pay | Admitting: *Deleted

## 2013-10-12 ENCOUNTER — Inpatient Hospital Stay (HOSPITAL_COMMUNITY): Payer: Medicaid Other

## 2013-10-12 ENCOUNTER — Inpatient Hospital Stay (HOSPITAL_COMMUNITY)
Admission: AD | Admit: 2013-10-12 | Discharge: 2013-10-14 | DRG: 776 | Disposition: A | Discharge status: Home / Self Care | Payer: Medicaid Other | Source: Ambulatory Visit | Attending: Obstetrics | Admitting: Obstetrics

## 2013-10-12 DIAGNOSIS — O9081 Anemia of the puerperium: Principal | ICD-10-CM | POA: Diagnosis present

## 2013-10-12 DIAGNOSIS — I517 Cardiomegaly: Secondary | ICD-10-CM | POA: Diagnosis present

## 2013-10-12 DIAGNOSIS — O99893 Other specified diseases and conditions complicating puerperium: Secondary | ICD-10-CM | POA: Diagnosis present

## 2013-10-12 DIAGNOSIS — O9989 Other specified diseases and conditions complicating pregnancy, childbirth and the puerperium: Secondary | ICD-10-CM

## 2013-10-12 DIAGNOSIS — R0602 Shortness of breath: Secondary | ICD-10-CM | POA: Diagnosis present

## 2013-10-12 DIAGNOSIS — D649 Anemia, unspecified: Secondary | ICD-10-CM | POA: Diagnosis present

## 2013-10-12 DIAGNOSIS — D62 Acute posthemorrhagic anemia: Secondary | ICD-10-CM | POA: Diagnosis present

## 2013-10-12 LAB — HEMOGLOBIN AND HEMATOCRIT, BLOOD
HCT: 20.3 % — ABNORMAL LOW (ref 36.0–46.0)
Hemoglobin: 6.6 g/dL — CL (ref 12.0–15.0)

## 2013-10-12 LAB — URINALYSIS, ROUTINE W REFLEX MICROSCOPIC
Bilirubin Urine: NEGATIVE
Glucose, UA: NEGATIVE mg/dL
Ketones, ur: 15 mg/dL — AB
Nitrite: NEGATIVE
Protein, ur: NEGATIVE mg/dL
Specific Gravity, Urine: 1.01 (ref 1.005–1.030)
Urobilinogen, UA: 1 mg/dL (ref 0.0–1.0)
pH: 6.5 (ref 5.0–8.0)

## 2013-10-12 LAB — PREPARE RBC (CROSSMATCH)

## 2013-10-12 LAB — URINE MICROSCOPIC-ADD ON

## 2013-10-12 MED ORDER — OXYCODONE-ACETAMINOPHEN 5-325 MG PO TABS
1.0000 | ORAL_TABLET | Freq: Once | ORAL | Status: AC
  Administered 2013-10-12: 1 via ORAL
  Filled 2013-10-12: qty 1

## 2013-10-12 MED ORDER — PROMETHAZINE HCL 25 MG PO TABS: 25.0000 mg | ORAL_TABLET | Freq: Three times a day (TID) | ORAL | Status: DC | PRN

## 2013-10-12 MED ORDER — PRENATAL MULTIVITAMIN CH
1.0000 | ORAL_TABLET | Freq: Every day | ORAL | Status: DC
  Administered 2013-10-12 – 2013-10-13 (×2): 1 via ORAL
  Filled 2013-10-12 (×2): qty 1

## 2013-10-12 MED ORDER — OXYCODONE-ACETAMINOPHEN 5-325 MG PO TABS
1.0000 | ORAL_TABLET | ORAL | Status: DC | PRN
  Administered 2013-10-12 – 2013-10-13 (×2): 1 via ORAL
  Filled 2013-10-12 (×2): qty 1

## 2013-10-12 MED ORDER — IBUPROFEN 600 MG PO TABS
600.0000 mg | ORAL_TABLET | Freq: Four times a day (QID) | ORAL | Status: DC | PRN
  Administered 2013-10-12 – 2013-10-13 (×2): 600 mg via ORAL
  Filled 2013-10-12 (×3): qty 1

## 2013-10-12 NOTE — Progress Notes (Signed)
Blood transfusion complete and patient states she feels SOB.Respirations 20 patient is crying and upset and was told to calm down and take slow deep breaths.O2 SAT 98% on room air but pt started on O2 via [place] at 2 liters.This seemed to calm her down some.Patient also c/o lower back pain but felt it was the bed.Dr Clearance Coots called for order to get CXR and this was ordered.Patient was given a Percocet for back pain.Patient going down to Radiology.

## 2013-10-12 NOTE — MAU Note (Signed)
Pt presents with complaints of SOB that started early this morning around 2am. Pt had a cesarean section on 10-08-13.

## 2013-10-12 NOTE — MAU Provider Note (Signed)
History     CSN: [id number]  Arrival date and time: 10/12/13 8657   First Provider Initiated Contact with Patient 10/12/13 1024      Chief Complaint  Patient presents with  . Shortness of Breath   HPI Comments: Suzanne [name] 28 y.o. G1P1001 presents to MAU for SOB that started last night. She had a C/S with Dr Tamela Oddi on 10/08/13 and delivered a baby boy. Her H/H at discharge was 7.2 and 22. She was told by MD that she " lost a lot of blood" in surgery. She was told to start taking iron tablets but had been unable to get them as yet. She states shee had a lot of fluids in hospital and her legs are very swollen. She is also complaining of headache and wants something for pain.   Shortness of Breath Associated symptoms include headaches and leg swelling.      Past Medical History  Diagnosis Date  . Sickle cell trait   . Acid reflux     Past Surgical History  Procedure Laterality Date  . No past surgeries    . Cesarean section N/A 10/08/2013    Procedure: CESAREAN SECTION;  Surgeon: Antionette Char, MD;  Location: [facility];  Service: Obstetrics;  Laterality: N/A;    Family History  Problem Relation Age of Onset  . Diabetes Mother   . Arthritis Mother   . Hypertension Mother   . Diabetes Maternal Grandmother   . Hypertension Maternal Grandmother   . Cancer Neg Hx   . Heart disease Neg Hx     History  Substance Use Topics  . Smoking status: Never Smoker   . Smokeless tobacco: Never Used  . Alcohol Use: No    Allergies: No Known Allergies  Prescriptions prior to admission  Medication Sig Dispense Refill  . ibuprofen (ADVIL,MOTRIN) 600 MG tablet Take 1 tablet (600 mg total) by mouth every 6 (six) hours as needed for mild pain.  30 tablet  5  . Iron-FA-B Cmp-C-Biot-Probiotic (FUSION PLUS) CAPS Take 1 capsule by mouth daily before breakfast.  30 capsule  5  . Prenatal Vit-Fe Fumarate-FA (PRENATAL MULTIVITAMIN) TABS tablet Take 1 tablet by mouth daily at 12  noon.      . promethazine (PHENERGAN) 25 MG tablet Take 1 tablet (25 mg total) by mouth every 8 (eight) hours as needed for nausea.  60 tablet  1    Review of Systems  Constitutional: Negative.   Respiratory: Positive for shortness of breath.   Cardiovascular: Positive for leg swelling.  Gastrointestinal: Negative.   Genitourinary: Negative.   Musculoskeletal: Negative.   Skin: Negative.        C/S incision is not draining  Neurological: Positive for headaches.  Psychiatric/Behavioral: Negative.    Physical Exam   Blood pressure 146/94, pulse 116, temperature 98 F (36.7 C), temperature source Oral, resp. rate 18, SpO2 98.00%, unknown if currently breastfeeding.  Physical Exam  Constitutional: She is oriented to person, place, and time. She appears well-developed and well-nourished. No distress.  HENT:  Head: Normocephalic and atraumatic.  Eyes: Pupils are equal, round, and reactive to light.  Cardiovascular: Regular rhythm and normal heart sounds.   Tachycardia 114  Respiratory: Effort normal and breath sounds normal. No respiratory distress. She has no wheezes. She has no rales. She exhibits no tenderness.  GI: Soft. Bowel sounds are normal. She exhibits no distension and no mass. There is no tenderness. There is no rebound and no guarding.  Genitourinary:  Not examined  Musculoskeletal: She exhibits edema.  +3 pitting edema up to knees  Neurological: She is alert and oriented to person, place, and time.  Skin: Skin is warm and dry.  Psychiatric: She has a normal mood and affect. Her behavior is normal. Judgment and thought content normal.   Results for orders placed during the hospital encounter of 10/12/13 (from the past 24 hour(s))  URINALYSIS, ROUTINE W REFLEX MICROSCOPIC     Status: Abnormal   Collection Time    10/12/13  8:36 AM      Result Value Range   Color, Urine YELLOW  YELLOW   APPearance CLEAR  CLEAR   Specific Gravity, Urine 1.010  1.005 - 1.030   pH 6.5   5.0 - 8.0   Glucose, UA NEGATIVE  NEGATIVE mg/dL   Hgb urine dipstick LARGE (*) NEGATIVE   Bilirubin Urine NEGATIVE  NEGATIVE   Ketones, ur 15 (*) NEGATIVE mg/dL   Protein, ur NEGATIVE  NEGATIVE mg/dL   Urobilinogen, UA 1.0  0.0 - 1.0 mg/dL   Nitrite NEGATIVE  NEGATIVE   Leukocytes, UA SMALL (*) NEGATIVE  URINE MICROSCOPIC-ADD ON     Status: Abnormal   Collection Time    10/12/13  8:36 AM      Result Value Range   Squamous Epithelial / LPF FEW (*) RARE   WBC, UA 0-2  <3 WBC/hpf   RBC / HPF 11-20  <3 RBC/hpf  HEMOGLOBIN AND HEMATOCRIT, BLOOD     Status: Abnormal   Collection Time    10/12/13 10:52 AM      Result Value Range   Hemoglobin 6.6 (*) 12.0 - 15.0 g/dL   HCT 16.120.3 (*) 09.636.0 - 04.546.0 %    MAU Course  Procedures  MDM  H/H Percocet for headache Call Dr Clearance CootsHarper who advised Type and Screen for 2 Units Packed   Assessment and Plan   A: Symptomatic Anemia P: Transfer to 3rd floor for transfusion Transferred care to Dr Clent RidgesHarper  Suzanne [name], Suzanne [name] 10/12/2013, 10:40 AM

## 2013-10-12 NOTE — Progress Notes (Signed)
Back from Radiology and patient tolerated this well.Pt has some DOE when up to bathroom.Placed back on O2 3L via  and encouraged to continue to use I/S frequently.

## 2013-10-12 NOTE — MAU Note (Signed)
CRITICAL VALUE ALERT  Critical value received: Hgb 6.6  Date of notification: 10/12/2013  Time of notification:  11:14  Critical value read back:yes  Nurse who received alert:  Sharen Hint RN  MD notified (1st page):  Jannifer Rodney NP  Time of first page:  11:15  MD notified (2nd page):  Time of second page:  Responding MD:  Jannifer Rodney NP  Time MD responded: 11:15

## 2013-10-12 NOTE — H&P (Signed)
Suzanne [name] is a 28 y.o. female presenting for generalized weakness and SOB. Maternal Medical History:  Reason for admission: 28 yo G1 P1.  Postpartum.  Anemic after delivery but discharged home in stable condition.  Fetal activity: Perceived fetal activity is normal.   Last perceived fetal movement was within the past hour.    Prenatal complications: no prenatal complications Prenatal Complications - Diabetes: none.    OB History   Grav Para Term Preterm Abortions TAB SAB Ect Mult Living   1 1 1       1      Past Medical History  Diagnosis Date  . Sickle cell trait   . Acid reflux    Past Surgical History  Procedure Laterality Date  . No past surgeries    . Cesarean section N/A 10/08/2013    Procedure: CESAREAN SECTION;  Surgeon: Antionette Char, MD;  Location: [facility];  Service: Obstetrics;  Laterality: N/A;   Family History: family history includes Arthritis in her mother; Diabetes in her maternal grandmother and mother; Hypertension in her maternal grandmother and mother. There is no history of Cancer or Heart disease. Social History:  reports that she has never smoked. She has never used smokeless tobacco. She reports that she does not drink alcohol or use illicit drugs.       Review of Systems  All other systems reviewed and are negative.      Blood pressure 119/63, pulse 115, temperature 98.4 F (36.9 C), temperature source Oral, resp. rate 19, SpO2 98.00%, unknown if currently breastfeeding. Maternal Exam:  Abdomen: Patient reports no abdominal tenderness.   Physical Exam  Nursing note and vitals reviewed. Constitutional: She is oriented to person, place, and time. She appears well-developed and well-nourished.  HENT:  Head: Normocephalic and atraumatic.  Eyes: Conjunctivae are normal. Pupils are equal, round, and reactive to light.  Neck: Normal range of motion. Neck supple.  Cardiovascular:  Heart rate > 100bpm.  Orthostatic.  Respiratory: Effort  normal.  GI: Soft.  Musculoskeletal: Normal range of motion.  Neurological: She is alert and oriented to person, place, and time.  Skin: Skin is warm and dry.  Psychiatric: She has a normal mood and affect. Her behavior is normal. Judgment and thought content normal.    Prenatal labs: ABO, Rh: --/--/A POS (01/18 1138) Antibody: NEG (01/18 1138) Rubella: 1.95 (07/17 1119) RPR: NON REACTIVE (01/13 1322)  HBsAg: NEGATIVE (07/17 1119)  HIV: NON REACTIVE (09/24 1201)  GBS: POSITIVE (12/23 1614)   Assessment/Plan Postpartum.  Anemia.  Symptomatic.  Will transfuse.  Discussed with patient.  Risks, complications and benefits discussed and patient agrees to transfusion.   Lori Liew A 10/12/2013, 2:18 PM

## 2013-10-12 NOTE — MAU Note (Signed)
S/P c/s 10/08/13. After taking bra off last night at 2200 for shower, she felt fullness in chest and breasts were larger. Took quick shower, then felt she could not take a breath when she was sleeping on her back through the night.  Patient is bottle feeding and denies any nipple stimulation. Reports significant breast tissue enlargement throughout pregancy. Denies heart palpitations, unusual swelling in hands or feet, or muscle pain in calves.

## 2013-10-13 DIAGNOSIS — D649 Anemia, unspecified: Secondary | ICD-10-CM | POA: Diagnosis present

## 2013-10-13 LAB — TYPE AND SCREEN
ABO/RH(D): A POS
Antibody Screen: NEGATIVE
Unit division: 0
Unit division: 0

## 2013-10-13 LAB — CBC
HCT: 26.6 % — ABNORMAL LOW (ref 36.0–46.0)
Hemoglobin: 8.8 g/dL — ABNORMAL LOW (ref 12.0–15.0)
MCH: 24.9 pg — ABNORMAL LOW (ref 26.0–34.0)
MCHC: 33.1 g/dL (ref 30.0–36.0)
MCV: 75.1 fL — ABNORMAL LOW (ref 78.0–100.0)
Platelets: 322 10*3/uL (ref 150–400)
RBC: 3.54 MIL/uL — ABNORMAL LOW (ref 3.87–5.11)
RDW: 16.7 % — ABNORMAL HIGH (ref 11.5–15.5)
WBC: 14.7 10*3/uL — ABNORMAL HIGH (ref 4.0–10.5)

## 2013-10-13 MED ORDER — FUROSEMIDE 10 MG/ML IJ SOLN
40.0000 mg | Freq: Once | INTRAMUSCULAR | Status: AC
  Administered 2013-10-13: 40 mg via INTRAVENOUS
  Filled 2013-10-13: qty 4

## 2013-10-13 NOTE — Progress Notes (Signed)
Subjective: Postpartum Day 5: Cesarean Delivery Patient reports tolerating PO.   Still c/o SOB with ambulation.  Objective: Vital signs in last 24 hours: Temp:  [97.6 F (36.4 C)-99.2 F (37.3 C)] 98.5 F (36.9 C) (01/19 0528) Pulse Rate:  [83-120] 89 (01/19 0528) Resp:  [18-20] 18 (01/19 0528) BP: (111-144)/(41-93) 136/73 mmHg (01/19 0528) SpO2:  [98 %-100 %] 100 % (01/19 0528)  Physical Exam:  General: alert and no distress Lochia: appropriate Uterine Fundus: firm Incision: healing well DVT Evaluation: No evidence of DVT seen on physical exam.   Recent Labs  10/12/13 1052 10/13/13 0520  HGB 6.6* 8.8*  HCT 20.3* 26.6*    Assessment/Plan: Status post Cesarean section. Doing well postoperatively.   Symptomatic post op  Anemia.  Stable after transfusion but still symptomatic SOB with ambulation.  Lungs clear but CXR shows interstitial changes.  Will treat with a dose of Lasix.  Monitor closely. Continue current care.  HARPER,CHARLES A 10/13/2013, 9:17 AM

## 2013-10-13 NOTE — Progress Notes (Signed)
Ur chart review completed.  

## 2013-10-14 NOTE — Discharge Summary (Signed)
Obstetric Discharge Summary Reason for Admission: Readmit for acute symptomatic anemia, s/p c-section Prenatal Procedures: none Intrapartum Procedures: cesarean: low cervical, transverse Postpartum Procedures: transfusion see notes Complications-Operative and Postpartum: none Hemoglobin  Date Value Range Status  10/13/2013 8.8* 12.0 - 15.0 g/dL Final     REPEATED TO VERIFY     DELTA CHECK NOTED     POST TRANSFUSION SPECIMEN     HCT  Date Value Range Status  10/13/2013 26.6* 36.0 - 46.0 % Final    Physical Exam:  General: alert and cooperative Lochia: appropriate Uterine Fundus: firm Incision: no significant drainage, no dehiscence, no significant erythema DVT Evaluation: No evidence of DVT seen on physical exam. Filed Vitals:   10/14/13 0538  BP: 111/57  Pulse: 109  Temp: 98.5 F (36.9 C)  Resp: 18     Discharge Diagnoses: Term Pregnancy-delivered and S/P blood transfusion for acute anemia secondary to blood loss following c-section  Discharge Information: Date: 10/14/2013 Activity: pelvic rest Diet: routine Medications: continue on previous prescribed medications Condition: stable Instructions: refer to practice specific booklet Discharge to: home   Newborn Data: <<This visit is not associated with a pregnancy. Delivery information will not be displayed.>> Home with mother. Newborn at home w/ Grandmother.  Suzanne [name] 10/14/2013, 9:10 AM

## 2013-10-14 NOTE — Progress Notes (Signed)
Discharge instructions reviewed with patient.  Patient states understanding of home care, activity, medications, signs/symptoms to seek help for and report to MD and return MD office visit.  No home equipment needed.  Patient discharged via wheelchair in stable condition with staff without incident.

## 2013-10-15 ENCOUNTER — Emergency Department (HOSPITAL_COMMUNITY): Payer: Medicaid Other

## 2013-10-15 ENCOUNTER — Inpatient Hospital Stay (HOSPITAL_COMMUNITY)
Admission: EM | Admit: 2013-10-15 | Discharge: 2013-10-18 | DRG: 776 | Disposition: A | Discharge status: Home / Self Care | Payer: Medicaid Other | Attending: Internal Medicine | Admitting: Internal Medicine

## 2013-10-15 ENCOUNTER — Encounter (HOSPITAL_COMMUNITY): Payer: Self-pay | Admitting: Emergency Medicine

## 2013-10-15 DIAGNOSIS — R0600 Dyspnea, unspecified: Secondary | ICD-10-CM | POA: Diagnosis present

## 2013-10-15 DIAGNOSIS — I498 Other specified cardiac arrhythmias: Secondary | ICD-10-CM | POA: Diagnosis present

## 2013-10-15 DIAGNOSIS — K219 Gastro-esophageal reflux disease without esophagitis: Secondary | ICD-10-CM | POA: Diagnosis present

## 2013-10-15 DIAGNOSIS — I428 Other cardiomyopathies: Secondary | ICD-10-CM

## 2013-10-15 DIAGNOSIS — O165 Unspecified maternal hypertension, complicating the puerperium: Secondary | ICD-10-CM | POA: Diagnosis present

## 2013-10-15 DIAGNOSIS — J189 Pneumonia, unspecified organism: Secondary | ICD-10-CM | POA: Diagnosis present

## 2013-10-15 DIAGNOSIS — D509 Iron deficiency anemia, unspecified: Secondary | ICD-10-CM | POA: Diagnosis present

## 2013-10-15 DIAGNOSIS — O9081 Anemia of the puerperium: Secondary | ICD-10-CM | POA: Diagnosis present

## 2013-10-15 DIAGNOSIS — D62 Acute posthemorrhagic anemia: Secondary | ICD-10-CM | POA: Diagnosis present

## 2013-10-15 DIAGNOSIS — I509 Heart failure, unspecified: Secondary | ICD-10-CM

## 2013-10-15 DIAGNOSIS — J9600 Acute respiratory failure, unspecified whether with hypoxia or hypercapnia: Secondary | ICD-10-CM | POA: Diagnosis present

## 2013-10-15 DIAGNOSIS — O368390 Maternal care for abnormalities of the fetal heart rate or rhythm, unspecified trimester, not applicable or unspecified: Secondary | ICD-10-CM

## 2013-10-15 DIAGNOSIS — D649 Anemia, unspecified: Secondary | ICD-10-CM | POA: Diagnosis present

## 2013-10-15 DIAGNOSIS — Z79899 Other long term (current) drug therapy: Secondary | ICD-10-CM

## 2013-10-15 DIAGNOSIS — I5031 Acute diastolic (congestive) heart failure: Secondary | ICD-10-CM

## 2013-10-15 DIAGNOSIS — I429 Cardiomyopathy, unspecified: Secondary | ICD-10-CM

## 2013-10-15 DIAGNOSIS — R0989 Other specified symptoms and signs involving the circulatory and respiratory systems: Secondary | ICD-10-CM

## 2013-10-15 DIAGNOSIS — D72829 Elevated white blood cell count, unspecified: Secondary | ICD-10-CM | POA: Diagnosis present

## 2013-10-15 DIAGNOSIS — I369 Nonrheumatic tricuspid valve disorder, unspecified: Secondary | ICD-10-CM

## 2013-10-15 DIAGNOSIS — O903 Peripartum cardiomyopathy: Principal | ICD-10-CM | POA: Diagnosis present

## 2013-10-15 DIAGNOSIS — N390 Urinary tract infection, site not specified: Secondary | ICD-10-CM

## 2013-10-15 DIAGNOSIS — R0609 Other forms of dyspnea: Secondary | ICD-10-CM

## 2013-10-15 DIAGNOSIS — Z833 Family history of diabetes mellitus: Secondary | ICD-10-CM

## 2013-10-15 DIAGNOSIS — Z8249 Family history of ischemic heart disease and other diseases of the circulatory system: Secondary | ICD-10-CM

## 2013-10-15 DIAGNOSIS — O9989 Other specified diseases and conditions complicating pregnancy, childbirth and the puerperium: Secondary | ICD-10-CM

## 2013-10-15 DIAGNOSIS — O99893 Other specified diseases and conditions complicating puerperium: Secondary | ICD-10-CM | POA: Diagnosis present

## 2013-10-15 DIAGNOSIS — E8779 Other fluid overload: Secondary | ICD-10-CM | POA: Diagnosis present

## 2013-10-15 DIAGNOSIS — D573 Sickle-cell trait: Secondary | ICD-10-CM | POA: Diagnosis present

## 2013-10-15 DIAGNOSIS — J90 Pleural effusion, not elsewhere classified: Secondary | ICD-10-CM

## 2013-10-15 LAB — URINALYSIS W MICROSCOPIC + REFLEX CULTURE
Bilirubin Urine: NEGATIVE
Glucose, UA: NEGATIVE mg/dL
Ketones, ur: NEGATIVE mg/dL
Nitrite: NEGATIVE
Protein, ur: 30 mg/dL — AB
Specific Gravity, Urine: 1.012 (ref 1.005–1.030)
Urobilinogen, UA: 1 mg/dL (ref 0.0–1.0)
pH: 6.5 (ref 5.0–8.0)

## 2013-10-15 LAB — COMPREHENSIVE METABOLIC PANEL
ALT: 26 U/L (ref 0–35)
AST: 27 U/L (ref 0–37)
Albumin: 2.4 g/dL — ABNORMAL LOW (ref 3.5–5.2)
Alkaline Phosphatase: 99 U/L (ref 39–117)
BUN: 9 mg/dL (ref 6–23)
CO2: 27 mEq/L (ref 19–32)
Calcium: 9.1 mg/dL (ref 8.4–10.5)
Chloride: 104 mEq/L (ref 96–112)
Creatinine, Ser: 0.73 mg/dL (ref 0.50–1.10)
GFR calc Af Amer: >90 mL/min (ref >90)
GFR calc non Af Amer: >90 mL/min (ref >90)
Glucose, Bld: 113 mg/dL — ABNORMAL HIGH (ref 70–99)
Potassium: 3.9 mEq/L (ref 3.7–5.3)
Sodium: 147 mEq/L (ref 137–147)
Total Bilirubin: 0.5 mg/dL (ref 0.3–1.2)
Total Protein: 7.1 g/dL (ref 6.0–8.3)

## 2013-10-15 LAB — CBC WITH DIFFERENTIAL/PLATELET
Basophils Absolute: 0 10*3/uL (ref 0.0–0.1)
Basophils Absolute: 0 10*3/uL (ref 0.0–0.1)
Basophils Relative: 0 % (ref 0–1)
Basophils Relative: 0 % (ref 0–1)
Eosinophils Absolute: 0.1 10*3/uL (ref 0.0–0.7)
Eosinophils Absolute: 0.1 10*3/uL (ref 0.0–0.7)
Eosinophils Relative: 1 % (ref 0–5)
Eosinophils Relative: 1 % (ref 0–5)
HCT: 27.6 % — ABNORMAL LOW (ref 36.0–46.0)
HCT: 28.5 % — ABNORMAL LOW (ref 36.0–46.0)
Hemoglobin: 9.1 g/dL — ABNORMAL LOW (ref 12.0–15.0)
Hemoglobin: 9.3 g/dL — ABNORMAL LOW (ref 12.0–15.0)
Lymphocytes Relative: 14 % (ref 12–46)
Lymphocytes Relative: 14 % (ref 12–46)
Lymphs Abs: 2.1 10*3/uL (ref 0.7–4.0)
Lymphs Abs: 2 10*3/uL (ref 0.7–4.0)
MCH: 24.9 pg — ABNORMAL LOW (ref 26.0–34.0)
MCH: 25.1 pg — ABNORMAL LOW (ref 26.0–34.0)
MCHC: 33 g/dL (ref 30.0–36.0)
MCHC: 32.6 g/dL (ref 30.0–36.0)
MCV: 75.6 fL — ABNORMAL LOW (ref 78.0–100.0)
MCV: 76.8 fL — ABNORMAL LOW (ref 78.0–100.0)
Monocytes Absolute: 1 10*3/uL (ref 0.1–1.0)
Monocytes Absolute: 1 10*3/uL (ref 0.1–1.0)
Monocytes Relative: 7 % (ref 3–12)
Monocytes Relative: 7 % (ref 3–12)
Neutro Abs: 11.7 10*3/uL — ABNORMAL HIGH (ref 1.7–7.7)
Neutro Abs: 10.9 10*3/uL — ABNORMAL HIGH (ref 1.7–7.7)
Neutrophils Relative %: 79 % — ABNORMAL HIGH (ref 43–77)
Neutrophils Relative %: 78 % — ABNORMAL HIGH (ref 43–77)
Platelets: 458 10*3/uL — ABNORMAL HIGH (ref 150–400)
Platelets: 431 10*3/uL — ABNORMAL HIGH (ref 150–400)
RBC: 3.65 MIL/uL — ABNORMAL LOW (ref 3.87–5.11)
RBC: 3.71 MIL/uL — ABNORMAL LOW (ref 3.87–5.11)
RDW: 17.3 % — ABNORMAL HIGH (ref 11.5–15.5)
RDW: 17.7 % — ABNORMAL HIGH (ref 11.5–15.5)
WBC: 14.9 10*3/uL — ABNORMAL HIGH (ref 4.0–10.5)
WBC: 14 10*3/uL — ABNORMAL HIGH (ref 4.0–10.5)

## 2013-10-15 LAB — BASIC METABOLIC PANEL
BUN: 10 mg/dL (ref 6–23)
CO2: 25 mEq/L (ref 19–32)
Calcium: 8.9 mg/dL (ref 8.4–10.5)
Chloride: 102 mEq/L (ref 96–112)
Creatinine, Ser: 0.72 mg/dL (ref 0.50–1.10)
GFR calc Af Amer: >90 mL/min (ref >90)
GFR calc non Af Amer: >90 mL/min (ref >90)
Glucose, Bld: 105 mg/dL — ABNORMAL HIGH (ref 70–99)
Potassium: 3.5 mEq/L — ABNORMAL LOW (ref 3.7–5.3)
Sodium: 142 mEq/L (ref 137–147)

## 2013-10-15 LAB — TROPONIN I
Troponin I: <0.3 ng/mL (ref <0.30)
Troponin I: <0.3 ng/mL (ref <0.30)
Troponin I: <0.3 ng/mL (ref <0.30)

## 2013-10-15 LAB — PRO B NATRIURETIC PEPTIDE
Pro B Natriuretic peptide (BNP): 1446 pg/mL — ABNORMAL HIGH (ref 0–125)
Pro B Natriuretic peptide (BNP): 1537 pg/mL — ABNORMAL HIGH (ref 0–125)

## 2013-10-15 LAB — MAGNESIUM: Magnesium: 1.8 mg/dL (ref 1.5–2.5)

## 2013-10-15 LAB — PROTIME-INR
INR: 1.09 (ref 0.00–1.49)
Prothrombin Time: 13.9 seconds (ref 11.6–15.2)

## 2013-10-15 LAB — APTT: aPTT: 24 seconds (ref 24–37)

## 2013-10-15 LAB — TSH: TSH: 1.95 u[IU]/mL (ref 0.350–4.500)

## 2013-10-15 MED ORDER — SODIUM CHLORIDE 0.9 % IJ SOLN: 3.0000 mL | INTRAMUSCULAR | Status: DC | PRN

## 2013-10-15 MED ORDER — ENOXAPARIN SODIUM 60 MG/0.6ML ~~LOC~~ SOLN
55.0000 mg | SUBCUTANEOUS | Status: DC
Start: 2013-10-15 — End: 2013-10-18
  Administered 2013-10-15 – 2013-10-17 (×3): 55 mg via SUBCUTANEOUS
  Filled 2013-10-15 (×4): qty 0.6

## 2013-10-15 MED ORDER — SODIUM CHLORIDE 0.9 % IJ SOLN
3.0000 mL | Freq: Two times a day (BID) | INTRAMUSCULAR | Status: DC
  Administered 2013-10-15 – 2013-10-17 (×2): 3 mL via INTRAVENOUS

## 2013-10-15 MED ORDER — ACETAMINOPHEN 325 MG PO TABS
650.0000 mg | ORAL_TABLET | ORAL | Status: DC | PRN
  Administered 2013-10-15 – 2013-10-17 (×4): 650 mg via ORAL
  Filled 2013-10-15 (×4): qty 2

## 2013-10-15 MED ORDER — SODIUM CHLORIDE 0.9 % IV SOLN: 250.0000 mL | INTRAVENOUS | Status: DC | PRN

## 2013-10-15 MED ORDER — ONDANSETRON HCL 4 MG/2ML IJ SOLN
4.0000 mg | Freq: Four times a day (QID) | INTRAMUSCULAR | Status: DC | PRN
Start: 2013-10-15 — End: 2013-10-18

## 2013-10-15 MED ORDER — FUROSEMIDE 10 MG/ML IJ SOLN
40.0000 mg | Freq: Two times a day (BID) | INTRAMUSCULAR | Status: DC
  Administered 2013-10-15 – 2013-10-16 (×3): 40 mg via INTRAVENOUS
  Filled 2013-10-15 (×4): qty 4

## 2013-10-15 MED ORDER — HYDRALAZINE HCL 25 MG PO TABS
25.0000 mg | ORAL_TABLET | Freq: Three times a day (TID) | ORAL | Status: DC
  Administered 2013-10-15 – 2013-10-18 (×9): 25 mg via ORAL
  Filled 2013-10-15 (×12): qty 1

## 2013-10-15 MED ORDER — IOHEXOL 350 MG/ML SOLN
100.0000 mL | Freq: Once | INTRAVENOUS | Status: AC | PRN
  Administered 2013-10-15: 100 mL via INTRAVENOUS

## 2013-10-15 MED ORDER — DEXTROSE 5 % IV SOLN: 1.0000 g | INTRAVENOUS | Status: DC

## 2013-10-15 MED ORDER — SODIUM CHLORIDE 0.9 % IV SOLN
Freq: Once | INTRAVENOUS | Status: AC
  Administered 2013-10-15: 12:00:00 via INTRAVENOUS

## 2013-10-15 MED ORDER — VANCOMYCIN HCL 10 G IV SOLR
2000.0000 mg | Freq: Once | INTRAVENOUS | Status: DC
  Filled 2013-10-15: qty 2000

## 2013-10-15 MED ORDER — VITAMINS A & D EX OINT
TOPICAL_OINTMENT | CUTANEOUS | Status: AC
  Filled 2013-10-15: qty 15

## 2013-10-15 MED ORDER — FUROSEMIDE 10 MG/ML IJ SOLN
40.0000 mg | Freq: Once | INTRAMUSCULAR | Status: AC
  Administered 2013-10-15: 40 mg via INTRAVENOUS
  Filled 2013-10-15: qty 4

## 2013-10-15 MED ORDER — VANCOMYCIN HCL IN DEXTROSE 1-5 GM/200ML-% IV SOLN
1000.0000 mg | Freq: Two times a day (BID) | INTRAVENOUS | Status: DC
  Administered 2013-10-16 (×2): 1000 mg via INTRAVENOUS
  Filled 2013-10-15 (×2): qty 200

## 2013-10-15 MED ORDER — CEFEPIME HCL 1 G IJ SOLR
1.0000 g | Freq: Three times a day (TID) | INTRAMUSCULAR | Status: DC
  Administered 2013-10-15 – 2013-10-16 (×4): 1 g via INTRAVENOUS
  Filled 2013-10-15 (×5): qty 1

## 2013-10-15 MED ORDER — PRENATAL MULTIVITAMIN CH
1.0000 | ORAL_TABLET | Freq: Every day | ORAL | Status: DC
  Administered 2013-10-15 – 2013-10-17 (×3): 1 via ORAL
  Filled 2013-10-15 (×4): qty 1

## 2013-10-15 MED ORDER — DEXTROSE 5 % IV SOLN
1.0000 g | INTRAVENOUS | Status: DC
  Filled 2013-10-15: qty 1

## 2013-10-15 MED ORDER — SODIUM CHLORIDE 0.9 % IV SOLN
INTRAVENOUS | Status: DC
  Administered 2013-10-15: 09:00:00 via INTRAVENOUS

## 2013-10-15 MED ORDER — FUROSEMIDE 10 MG/ML IJ SOLN: 40.0000 mg | Freq: Every day | INTRAMUSCULAR | Status: DC

## 2013-10-15 MED ORDER — POTASSIUM CHLORIDE CRYS ER 20 MEQ PO TBCR
20.0000 meq | EXTENDED_RELEASE_TABLET | Freq: Every day | ORAL | Status: DC
  Administered 2013-10-15 – 2013-10-18 (×4): 20 meq via ORAL
  Filled 2013-10-15 (×4): qty 1

## 2013-10-15 NOTE — Progress Notes (Signed)
Echocardiogram 2D Echocardiogram has been performed.  Suzanne [name] 10/15/2013, 3:41 PM

## 2013-10-15 NOTE — ED Notes (Signed)
Patient and her mother state that her bilateral legs are swollen particularly her right however it is difficult for the nurse to evaluate due to patient's physical stature. There is a little indentation ankle ankles from her socks.

## 2013-10-15 NOTE — ED Notes (Signed)
Pt had c-section on 1/14; released from hospital yesterday being readmitted after having trouble with fluid overload; trouble breathing; required blood transfusion also; worse last night when laying down; can't sit back to breath; feels cold

## 2013-10-15 NOTE — ED Notes (Signed)
Patient obtaining a urine sample

## 2013-10-15 NOTE — ED Provider Notes (Signed)
CSN: [id number]     Arrival date & time 10/15/13  0725 History   First MD Initiated Contact with Patient 10/15/13 0740     Chief Complaint  Patient presents with  . Shortness of Breath    HPI Pt was seen at 0750.  Per pt and her mother, c/o gradual onset and worsening of persistent CP, SOB and peripheral edema for the past 1 week. Pt states her symptoms began shortly after her c-section on 10/08/13. States during her c-section she began to feel SOB and have "heavy" chest pain, but was told "that was normal." Endorses she had symptomatic anemic after her c-section and was readmitted to the hospital this past week for PRBC transfusion. Pt states she received a dose of lasix because "they said I had too much fluid on me." Pt states since her hospital discharge yesterday she has felt more SOB and has had worsening pedal edema. States her SOB worsens on exertion and laying down flat. Denies palpitations, no cough, no abd pain, no N/V/D, no back pain, no fevers, no rash.    Past Medical History  Diagnosis Date  . Sickle cell trait   . Acid reflux    Past Surgical History  Procedure Laterality Date  . Cesarean section N/A 10/08/2013    Procedure: CESAREAN SECTION;  Surgeon: Antionette Char, MD;  Location: [facility];  Service: Obstetrics;  Laterality: N/A;   Family History  Problem Relation Age of Onset  . Diabetes Mother   . Arthritis Mother   . Hypertension Mother   . Diabetes Maternal Grandmother   . Hypertension Maternal Grandmother   . Cancer Neg Hx   . Heart disease Neg Hx    History  Substance Use Topics  . Smoking status: Never Smoker   . Smokeless tobacco: Never Used  . Alcohol Use: No   OB History   Grav Para Term Preterm Abortions TAB SAB Ect Mult Living   1 1 1       1      Review of Systems ROS: Statement: All systems negative except as marked or noted in the HPI; Constitutional: Negative for fever and chills. ; ; Eyes: Negative for eye pain, redness and discharge. ; ;  ENMT: Negative for ear pain, hoarseness, nasal congestion, sinus pressure and sore throat. ; ; Cardiovascular: Negative for palpitations, diaphoresis, +CP, dyspnea and peripheral edema. ; ; Respiratory: Negative for cough, wheezing and stridor. ; ; Gastrointestinal: Negative for nausea, vomiting, diarrhea, abdominal pain, blood in stool, hematemesis, jaundice and rectal bleeding. . ; ; Genitourinary: Negative for dysuria, flank pain and hematuria. ; ; Musculoskeletal: Negative for back pain and neck pain. Negative for swelling and trauma.; ; Skin: Negative for pruritus, rash, abrasions, blisters, bruising and skin lesion.; ; Neuro: Negative for headache, lightheadedness and neck stiffness. Negative for weakness, altered level of consciousness , altered mental status, extremity weakness, paresthesias, involuntary movement, seizure and syncope.      Allergies  Review of patient's allergies indicates no known allergies.  Home Medications   Current Outpatient Rx  Name  Route  Sig  Dispense  Refill  . ibuprofen (ADVIL,MOTRIN) 600 MG tablet   Oral   Take 1 tablet (600 mg total) by mouth every 6 (six) hours as needed for mild pain.   30 tablet   5   . Iron-FA-B Cmp-C-Biot-Probiotic (FUSION PLUS) CAPS   Oral   Take 1 capsule by mouth daily before breakfast.   30 capsule   5   .  Prenatal Vit-Fe Fumarate-FA (PRENATAL MULTIVITAMIN) TABS tablet   Oral   Take 1 tablet by mouth daily at 12 noon.          BP 147/87  Pulse 108  Temp(Src) 99.6 F (37.6 C)  Resp 20  Wt 254 lb 6.4 oz (115.395 kg)  SpO2 96% Physical Exam 0755: Physical examination:  Nursing notes reviewed; Vital signs and O2 SAT reviewed;  Constitutional: Well developed, Well nourished, Well hydrated, Uncomfortable appearing.; Head:  Normocephalic, atraumatic; Eyes: EOMI, PERRL, No scleral icterus; ENMT: Mouth and pharynx normal, Mucous membranes moist; Neck: Supple, Full range of motion, No lymphadenopathy; Cardiovascular:  Tachycardic rate and rhythm, No gallop; Respiratory: Breath sounds coarse & equal bilaterally, No wheezes.  Speaking short phrases. Sitting upright. Normal respiratory effort/excursion; Chest: Nontender, Movement normal; Abdomen: Soft, Nontender, Nondistended, Normal bowel sounds; Genitourinary: No CVA tenderness; Extremities: Pulses normal, No tenderness, +1 pedal edema bilat without calf asymmetry.; Neuro: AA&Ox3, Major CN grossly intact.  Speech clear. No gross focal motor or sensory deficits in extremities.; Skin: Color normal, Warm, Dry.   ED Course  Procedures   EKG Interpretation   None       MDM  MDM Reviewed: previous chart, nursing note and vitals Reviewed previous: labs, ECG and x-ray Interpretation: labs, x-ray, CT scan and ECG     Results for orders placed during the hospital encounter of 10/15/13  URINALYSIS W MICROSCOPIC + REFLEX CULTURE      Result Value Range   Color, Urine YELLOW  YELLOW   APPearance CLOUDY (*) CLEAR   Specific Gravity, Urine 1.012  1.005 - 1.030   pH 6.5  5.0 - 8.0   Glucose, UA NEGATIVE  NEGATIVE mg/dL   Hgb urine dipstick LARGE (*) NEGATIVE   Bilirubin Urine NEGATIVE  NEGATIVE   Ketones, ur NEGATIVE  NEGATIVE mg/dL   Protein, ur 30 (*) NEGATIVE mg/dL   Urobilinogen, UA 1.0  0.0 - 1.0 mg/dL   Nitrite NEGATIVE  NEGATIVE   Leukocytes, UA LARGE (*) NEGATIVE   WBC, UA TOO NUMEROUS TO COUNT  <3 WBC/hpf   RBC / HPF 7-10  <3 RBC/hpf   Bacteria, UA FEW (*) RARE   Squamous Epithelial / LPF MANY (*) RARE   Urine-Other MUCOUS PRESENT    CBC WITH DIFFERENTIAL      Result Value Range   WBC 14.9 (*) 4.0 - 10.5 K/uL   RBC 3.65 (*) 3.87 - 5.11 MIL/uL   Hemoglobin 9.1 (*) 12.0 - 15.0 g/dL   HCT 47.827.6 (*) 29.536.0 - 62.146.0 %   MCV 75.6 (*) 78.0 - 100.0 fL   MCH 24.9 (*) 26.0 - 34.0 pg   MCHC 33.0  30.0 - 36.0 g/dL   RDW 30.817.3 (*) 65.711.5 - 84.615.5 %   Platelets 458 (*) 150 - 400 K/uL   Neutrophils Relative % 79 (*) 43 - 77 %   Neutro Abs 11.7 (*) 1.7 - 7.7  K/uL   Lymphocytes Relative 14  12 - 46 %   Lymphs Abs 2.1  0.7 - 4.0 K/uL   Monocytes Relative 7  3 - 12 %   Monocytes Absolute 1.0  0.1 - 1.0 K/uL   Eosinophils Relative 1  0 - 5 %   Eosinophils Absolute 0.1  0.0 - 0.7 K/uL   Basophils Relative 0  0 - 1 %   Basophils Absolute 0.0  0.0 - 0.1 K/uL  BASIC METABOLIC PANEL      Result Value Range   Sodium 142  137 - 147 mEq/L   Potassium 3.5 (*) 3.7 - 5.3 mEq/L   Chloride 102  96 - 112 mEq/L   CO2 25  19 - 32 mEq/L   Glucose, Bld 105 (*) 70 - 99 mg/dL   BUN 10  6 - 23 mg/dL   Creatinine, Ser 9.83  0.50 - 1.10 mg/dL   Calcium 8.9  8.4 - 38.2 mg/dL   GFR calc non Af Amer >90  >90 mL/min   GFR calc Af Amer >90  >90 mL/min  TROPONIN I      Result Value Range   Troponin I <0.30  <0.30 ng/mL  PRO B NATRIURETIC PEPTIDE      Result Value Range   Pro B Natriuretic peptide (BNP) 1446.0 (*) 0 - 125 pg/mL   Dg Chest 2 View 10/15/2013   CLINICAL DATA:  Productive cough and shortness of breath.  EXAM: CHEST  2 VIEW  COMPARISON:  PA and lateral chest 10/13/2003.  FINDINGS: Heart size is mildly enlarged. Lungs are clear. No pneumothorax or pleural fluid. No focal bony abnormality.  IMPRESSION: Mild cardiomegaly without acute disease.   Electronically Signed   By: Drusilla Kanner M.D.   On: 10/15/2013 09:08   Ct Angio Chest Pe W/cm &/or Wo Cm 10/15/2013   CLINICAL DATA:  Short of breath, evaluate for PE  EXAM: CT ANGIOGRAPHY CHEST WITH CONTRAST  TECHNIQUE: Multidetector CT imaging of the chest was performed using the standard protocol during bolus administration of intravenous contrast. Multiplanar CT image reconstructions including MIPs were obtained to evaluate the vascular anatomy.  CONTRAST:  OMNIPAQUE IOHEXOL 350 MG/ML SOLN  COMPARISON:  None.  FINDINGS: Is exam is sub optimal due to body habitus and poor concentration of the contrast bolus. There is no pulmonary embolism within the main pulmonary arteries or interlobar lobar arteries. The  segmental pulmonary arteries are poorly evaluated.  No acute findings aorta great vessels. The smaller residual thymus in the anterior mediastinum. No pericardial fluid.  Review of the lung parenchyma demonstrates the right pleural effusion. There is mild airspace disease in the right lower lobe. Mild atelectasis in the left lower lobe. No pneumothorax. Central airways are normal.  Limited view of the upper abdomen is unremarkable. Limited view of the skeleton is unremarkable.  Review of the MIP images confirms the above findings.  IMPRESSION: 1. Suboptimal exam. No filling defects within the main pulmonary arteries are internal lobar pulmonary arteries. The segmental pulmonary arteries are poorly evaluated as above.  2. Right pleural effusion and right lower lobe airspace disease. This could represent atelectasis, pneumonia, or potentially aspiration pneumonitis.   Electronically Signed   By: Genevive Bi M.D.   On: 10/15/2013 10:21    1040:  Persistently tachycardic, but afebrile with stable O2 Sats. No acute PE on CT-A. +HCAP, +UTI (UC pending) will start abx. Elevated BNP; will dose IV lasix. H/H improved from previous. Dx and testing d/w pt and family.  Questions answered.  Verb understanding, agreeable to admit.  T/C to [facility] Dr. Elisabeth Pigeon, case discussed, including:  HPI, pertinent PM/SHx, VS/PE, dx testing, ED course and treatment:  Agreeable to admit, requests to write temporary orders, obtain tele bed to team 8. T/C to [facility], case discussed, including:  HPI, pertinent PM/SHx, VS/PE, dx testing, ED course and treatment:  Agreeable to consult, requests to order echocardiogram.    Laray Anger, DO 10/18/13 5053

## 2013-10-15 NOTE — Consult Note (Signed)
CARDIOLOGY CONSULT NOTE   Patient ID: Suzanne [name] MRN: [id number], DOB/AGE: Mar 05, 1986   Admit date: 10/15/2013 Date of Consult: 10/15/2013   Primary Physician: No primary provider on file. Primary Cardiologist: None  Pt. Profile  This 28 year old woman presents to the emergency room with persistent shortness of breath.  She had cesarean section in 1 week ago.  Problem List  Past Medical History  Diagnosis Date  . Sickle cell trait   . Acid reflux     Past Surgical History  Procedure Laterality Date  . Cesarean section N/A 10/08/2013    Procedure: CESAREAN SECTION;  Surgeon: Antionette Char, MD;  Location: [facility];  Service: Obstetrics;  Laterality: N/A;     Allergies  No Known Allergies  HPI   This pleasant 28 year old [place] American woman is seen for shortness of breath occurring at rest.  She had a cesarean section one week ago.  This was her first pregnancy.  The patient states that there was significant blood loss and that she received 2 units of packed cells 3 days ago for severe anemia.  She was initially discharged after giving birth on 10/08/13 and she was discharged on 10/10/13 from [facility].  She awoke out of sleep on the evening of 10/11/13 acutely short of breath.  She went back to [facility] where she was admitted on 10/12/13 and found to be anemic with hemoglobin of 6.6 and was given 2 units of packed cells.  She was also given a dose of Lasix.  She went home from [facility] yesterday.  Again last night she awoke at asleep severely dyspneic and came to [facility] emergency room this morning.  She has not been having any chest pain.  Her EKG shows no acute changes. Her past medical history reveals no history of hypertension or diabetes.  She states that her pregnancy itself was uncomplicated.  Her cesarean section was done because of fetal distress.  Inpatient Medications  . furosemide  40 mg Intravenous Once    Family History Family  History  Problem Relation Age of Onset  . Diabetes Mother   . Arthritis Mother   . Hypertension Mother   . Diabetes Maternal Grandmother   . Hypertension Maternal Grandmother   . Cancer Neg Hx   . Heart disease Neg Hx      Social History History   Social History  . Marital Status: Single    Spouse Name: N/A    Number of Children: N/A  . Years of Education: N/A   Occupational History  . Not on file.   Social History Main Topics  . Smoking status: Never Smoker   . Smokeless tobacco: Never Used  . Alcohol Use: No  . Drug Use: No  . Sexual Activity: No   Other Topics Concern  . Not on file   Social History Narrative  . No narrative on file     Review of Systems  General:  No chills, fever, night sweats or weight changes.  Cardiovascular:  No chest pain, dyspnea on exertion, edema, positive for paroxysmal nocturnal dyspnea  Dermatological: No rash, lesions/masses Respiratory: No cough, dyspnea Urologic: No hematuria, dysuria Abdominal:   No nausea, vomiting, diarrhea, bright red blood per rectum, melena, or hematemesis Neurologic:  No visual changes, wkns, changes in mental status. All other systems reviewed and are otherwise negative except as noted above.  Physical Exam  Blood pressure 130/61, pulse 115, temperature 99.6 F (37.6 C), resp. rate 20, height 4'  11.06" (1.5 m), weight 254 lb 6.4 oz (115.395 kg), SpO2 95.00%.  General: Pleasant, NAD.  Obese. Psych: Normal affect. Neuro: Alert and oriented X 3. Moves all extremities spontaneously. HEENT: Normal  Neck: Supple without bruits or JVD. Lungs:  Resp regular and unlabored, CTA. Heart: RRR .  There is a soft S3 gallop. Abdomen: Soft, non-tender, non-distended, BS + x 4.  Extremities: No clubbing, cyanosis.  Trace edema. DP/PT/Radials 2+ and equal bilaterally.  Labs   Recent Labs  10/15/13 0800  TROPONINI <0.30   Lab Results  Component Value Date   WBC 14.9* 10/15/2013   HGB 9.1* 10/15/2013   HCT  27.6* 10/15/2013   MCV 75.6* 10/15/2013   PLT 458* 10/15/2013     Recent Labs Lab 10/15/13 0800  NA 142  K 3.5*  CL 102  CO2 25  BUN 10  CREATININE 0.72  CALCIUM 8.9  GLUCOSE 105*   No results found for this basename: CHOL,  HDL,  LDLCALC,  TRIG   No results found for this basename: DDIMER    Radiology/Studies  Dg Chest 2 View  10/15/2013   CLINICAL DATA:  Productive cough and shortness of breath.  EXAM: CHEST  2 VIEW  COMPARISON:  PA and lateral chest 10/13/2003.  FINDINGS: Heart size is mildly enlarged. Lungs are clear. No pneumothorax or pleural fluid. No focal bony abnormality.  IMPRESSION: Mild cardiomegaly without acute disease.   Electronically Signed   By: Drusilla Kanner M.D.   On: 10/15/2013 09:08   Dg Chest 2 View  10/13/2013   CLINICAL DATA:  C-section 4 days ago.  Shortness of breath.  EXAM: CHEST  2 VIEW  COMPARISON:  DG CHEST 2 VIEW dated 12/08/2007  FINDINGS: Midline trachea. Mild cardiomegaly. Mediastinal contours otherwise within normal limits. Thickening of the fissures, suggesting trace pleural fluid. No pneumothorax. Lower lobe predominant interstitial and airspace opacities are mild.  IMPRESSION: Borderline cardiomegaly with lower lobe predominant interstitial and airspace opacities. Suspicious for mild pulmonary venous congestion.   Electronically Signed   By: Jeronimo Greaves M.D.   On: 10/13/2013 01:08   Ct Angio Chest Pe W/cm &/or Wo Cm  10/15/2013   CLINICAL DATA:  Short of breath, evaluate for PE  EXAM: CT ANGIOGRAPHY CHEST WITH CONTRAST  TECHNIQUE: Multidetector CT imaging of the chest was performed using the standard protocol during bolus administration of intravenous contrast. Multiplanar CT image reconstructions including MIPs were obtained to evaluate the vascular anatomy.  CONTRAST:  OMNIPAQUE IOHEXOL 350 MG/ML SOLN  COMPARISON:  None.  FINDINGS: Is exam is sub optimal due to body habitus and poor concentration of the contrast bolus. There is no  pulmonary embolism within the main pulmonary arteries or interlobar lobar arteries. The segmental pulmonary arteries are poorly evaluated.  No acute findings aorta great vessels. The smaller residual thymus in the anterior mediastinum. No pericardial fluid.  Review of the lung parenchyma demonstrates the right pleural effusion. There is mild airspace disease in the right lower lobe. Mild atelectasis in the left lower lobe. No pneumothorax. Central airways are normal.  Limited view of the upper abdomen is unremarkable. Limited view of the skeleton is unremarkable.  Review of the MIP images confirms the above findings.  IMPRESSION: 1. Suboptimal exam. No filling defects within the main pulmonary arteries are internal lobar pulmonary arteries. The segmental pulmonary arteries are poorly evaluated as above.  2. Right pleural effusion and right lower lobe airspace disease. This could represent atelectasis, pneumonia, or potentially aspiration  pneumonitis.   Electronically Signed   By: Genevive BiStewart  Edmunds M.D.   On: 10/15/2013 10:21    ECG  Sinus tachycardia at 105 per minute.  Nonspecific T-wave flattening.  Prolonged QTc interval 496  ASSESSMENT AND PLAN  1.  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  No prior cardiac history. 2. iron deficiency anemia secondary to blood loss anemia 3. recent cesarean section with birth of a healthy female infant 1 week ago.  She is not planning on breast-feeding.  Plan: We will obtain an echocardiogram to evaluate her left ventricular function. We will treat her with Lasix and with afterload reduction using hydralazine.  Will consider adding nitrates for afterload reduction if symptoms fail to improve quickly.  Consider adding beta blockers later after acute decompensation has improved.  At this point on exam she is in no acute distress and her lungs are clear to auscultation.  Her pro BNP  is mildly elevated at 1446. Will follow with you.  Signed, Cassell Clementhomas Wilhemenia Camba, MD  10/15/2013, 11:30 AM

## 2013-10-15 NOTE — Progress Notes (Signed)
ANTIBIOTIC CONSULT NOTE - INITIAL  Pharmacy Consult for Vancomycin, antibiotic renal dose adjustment Indication: PNA  No Known Allergies  Patient Measurements: Weight: 254 lb 6.4 oz (115.395 kg) Height = 150cm Adjusted Body Weight:   Vital Signs: Temp: 99.6 F (37.6 C) (01/21 0732) BP: 147/87 mmHg (01/21 0732) Pulse Rate: 108 (01/21 0757) Intake/Output from previous day:   Intake/Output from this shift:    Labs:  Recent Labs  10/13/13 0520 10/15/13 0800  WBC 14.7* 14.9*  HGB 8.8* 9.1*  PLT 322 458*  CREATININE  --  0.72   The CrCl is unknown because both a height and weight (above a minimum accepted value) are required for this calculation. No results found for this basename: VANCOTROUGH, VANCOPEAK, VANCORANDOM, GENTTROUGH, GENTPEAK, GENTRANDOM, TOBRATROUGH, TOBRAPEAK, TOBRARND, AMIKACINPEAK, AMIKACINTROU, AMIKACIN,  in the last 72 hours   Microbiology: Recent Results (from the past 720 hour(s))  STREP B DNA PROBE     Status: None   Collection Time    09/16/13  4:14 PM      Result Value Range Status   GBSP POSITIVE   Final  WET PREP, GENITAL     Status: Abnormal   Collection Time    09/20/13 12:40 AM      Result Value Range Status   Yeast Wet Prep HPF POC NONE SEEN  NONE SEEN Final   Trich, Wet Prep NONE SEEN  NONE SEEN Final   Clue Cells Wet Prep HPF POC NONE SEEN  NONE SEEN Final   WBC, Wet Prep HPF POC MODERATE (*) NONE SEEN Final   Comment: MODERATE BACTERIA SEEN    Medical History: Past Medical History  Diagnosis Date  . Sickle cell trait   . Acid reflux     Assessment: 27 yoF recently post-partum (10/08/13 c-section at [facility]) and hx sickle cell trait presents to [facility] with CP, SOB peripheral edema x 1 weeks.  Pharmacy consulted to dose vancomycin for HCAP, UTI.    PK of vancomycin may be altered in patients recently post-partum.  Volume of distribution and total plasma clearance may be increased.    Appears prepregancy weight ~100kg  D1  Antibiotics 1/21 >> Cefepime >> 1/21 1/21 >> Ceftriaxone >>  1/21 >> Vancomycin >  Tm24h: 99.6 WBC elevated 14.9 Renal: SCr 0.7, CrCl >100   Microbiology: Urine culture collected 1/21  Goal of Therapy:  Vancomycin trough level 15-20 mcg/ml  Plan:  1. Vancomycin 2g x 1, then 1g q 12 hours 2. Ceftriaxone dose ok.    Haynes Hoehn, PharmD, BCPS 10/15/2013, 11:18 AM  Pager: [phone]

## 2013-10-15 NOTE — ED Notes (Signed)
Patient has pink frothy sputum

## 2013-10-15 NOTE — H&P (Signed)
[facility] History and Physical  Suzanne [name] ZOX:[id number] DOB: [birth year] DOA: 10/15/2013  Referring physician: ER physician PCP: No primary provider on file.   Chief Complaint: shortness of breath  HPI:  28 year old female who had recent C-section (about 1 week prior to this admission) who presented to [facility] ED 10/15/2013 with worsening shortness of breath over past 24 hours prior to this admission. She initially presented to [facility] hospital with dyspnea on 10/12/2013 and she was admitted for symptomatic anemia with transfusion of 2 units of PRBC's. Her shortness of breath worsened and she presented to [facility]. On admission, she reports shortness of breath but no chest pain, no LE swelling. No palpitations. No lightheadedness or LOC. No abdominal pain, no nausea or vomiting.  In ED, BP was 130/62, HR 101-115, Tmax 99.6 F and oxygen saturation of 95% on room air. Her WBC count was 14 (on 1/19 it was 14.9), hemoglobin was 9.1. Cardiac enzymes were negative. BNP was elevated at ~1400. She was started on lasix 40 mg IV Q 12 hours. The 12 lead EKG showed sinus tachycardia. 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 CT chest showed no filling defects within the main pulmonary arteries; also seen was right pleural effusion and right lower lobe airspace disease.  Assessment and Plan:  Principal Problem: Respiratory failure, acute - likely due to acute postpartum cardiomyopathy, pneumonia - cardio is following for input on management for PP CM - management with lasix 40 mg IV Q 12 hours, strict intake and output, daily weight and potassium repletion - for possible right lower lobe pneumonia pt on broad spectrum Abx, cefepime and vanco; pt was started on cefepime and vanco and blood cultures not obtained in ED. Since pt already on abx will obtain blood cultures if pt spikes a fever  Active Problem: Hypertension - on hydralazine 25 mg Q 8 hours Anemia,  acute blood loss - has had recent blood transfusion in [facility] hospital but not on this current admission Leukocytosis - recent C-section - on cefepime and vanco - will obtain blood culture if pt spikes fever   Radiological Exams on Admission: Dg Chest 2 View 10/15/2013    IMPRESSION: Mild cardiomegaly without acute disease.     Ct Angio Chest Pe W/cm &/or Wo Cm 10/15/2013  IMPRESSION: 1. Suboptimal exam. No filling defects within the main pulmonary arteries are internal lobar pulmonary arteries. The segmental pulmonary arteries are poorly evaluated as above.  2. Right pleural effusion and right lower lobe airspace disease. This could represent atelectasis, pneumonia, or potentially aspiration pneumonitis.     EKG: Sinus tachycardia  Code Status: Full Family Communication: Pt at bedside Disposition Plan: Admit for further evaluation  Manson Passey, MD  [facility] Pager [phone]  Review of Systems:  Constitutional: Negative for fever, chills and malaise/fatigue. Negative for diaphoresis.  HENT: Negative for hearing loss, ear pain, nosebleeds, congestion, sore throat, neck pain, tinnitus and ear discharge.   Eyes: Negative for blurred vision, double vision, photophobia, pain, discharge and redness.  Respiratory: Negative for cough, hemoptysis, sputum production, positive for shortness of breath, no wheezing and stridor.   Cardiovascular: Negative for chest pain, palpitations, orthopnea, claudication and leg swelling.  Gastrointestinal: Negative for nausea, vomiting and abdominal pain. Negative for heartburn, constipation, blood in stool and melena.  Genitourinary: Negative for dysuria, urgency, frequency, hematuria and flank pain.  Musculoskeletal: Negative for myalgias, back pain, joint pain and falls.  Skin: Negative for itching and rash.  Neurological: Negative  for dizziness and weakness. Negative for tingling, tremors, sensory change, speech change, focal weakness, loss of  consciousness and headaches.  Endo/Heme/Allergies: Negative for environmental allergies and polydipsia. Does not bruise/bleed easily.  Psychiatric/Behavioral: Negative for suicidal ideas. The patient is not nervous/anxious.      Past Medical History  Diagnosis Date  . Sickle cell trait   . Acid reflux    Past Surgical History  Procedure Laterality Date  . Cesarean section N/A 10/08/2013    Procedure: CESAREAN SECTION;  Surgeon: Antionette Char, MD;  Location: [facility];  Service: Obstetrics;  Laterality: N/A;   Social History:  reports that she has never smoked. She has never used smokeless tobacco. She reports that she does not drink alcohol or use illicit drugs.  No Known Allergies  Family History: no history of cancers, no cardiovascular diseases on mother or father side  Prior to Admission medications   Medication Sig Start Date End Date Taking? Authorizing Provider  ibuprofen (ADVIL,MOTRIN) 600 MG tablet Take 1 tablet (600 mg total) by mouth every 6 (six) hours as needed for mild pain. 10/10/13  Yes Brock Bad, MD  Iron-FA-B Cmp-C-Biot-Probiotic (FUSION PLUS) CAPS Take 1 capsule by mouth daily before breakfast. 10/10/13  Yes Brock Bad, MD  Prenatal Vit-Fe Fumarate-FA (PRENATAL MULTIVITAMIN) TABS tablet Take 1 tablet by mouth daily at 12 noon.   Yes Historical Provider, MD   Physical Exam: Filed Vitals:   10/15/13 1140 10/15/13 1258 10/15/13 1259 10/15/13 1536  BP: 135/77  147/96 145/88  Pulse: 112  115   Temp:   98.7 F (37.1 C)   TempSrc:   Oral   Resp: 24  20   Height:  4\' 11"  (1.499 m)    Weight:  113.399 kg (250 lb)    SpO2: 97%  99%     Physical Exam  Constitutional: Appears well-developed and well-nourished. No distress.  HENT: Normocephalic. External right and left ear normal. Oropharynx is clear and moist.  Eyes: Conjunctivae and EOM are normal. PERRLA, no scleral icterus.  Neck: Normal ROM. Neck supple. No JVD. No tracheal deviation. No  thyromegaly.  CVS: RRR, S1/S2 +, no murmurs, no gallops, no carotid bruit.  Pulmonary: Effort and breath sounds normal, no stridor, rhonchi, wheezes, rales.  Abdominal: Soft. BS +,  no distension, tenderness, rebound or guarding.  Musculoskeletal: Normal range of motion. No edema and no tenderness.  Lymphadenopathy: No lymphadenopathy noted, cervical, inguinal. Neuro: Alert. Normal reflexes, muscle tone coordination. No cranial nerve deficit. Skin: Skin is warm and dry. No rash noted. Not diaphoretic. No erythema. No pallor.  Psychiatric: Normal mood and affect. Behavior, judgment, thought content normal.   Labs on Admission:  Basic Metabolic Panel:  Recent Labs Lab 10/08/13 2351 10/15/13 0800 10/15/13 1345  NA 138 142 147  K 4.0 3.5* 3.9  CL 104 102 104  CO2 25 25 27   GLUCOSE 130* 105* 113*  BUN 8 10 9   CREATININE 0.76 0.72 0.73  CALCIUM 8.7 8.9 9.1  MG  --   --  1.8   Liver Function Tests:  Recent Labs Lab 10/15/13 1345  AST 27  ALT 26  ALKPHOS 99  BILITOT 0.5  PROT 7.1  ALBUMIN 2.4*   No results found for this basename: LIPASE, AMYLASE,  in the last 168 hours No results found for this basename: AMMONIA,  in the last 168 hours CBC:  Recent Labs Lab 10/08/13 2351 10/09/13 0655 10/12/13 1052 10/13/13 0520 10/15/13 0800 10/15/13 1345  WBC  14.0* 12.5*  --  14.7* 14.9* 14.0*  NEUTROABS  --   --   --   --  11.7* 10.9*  HGB 7.3* 7.2* 6.6* 8.8* 9.1* 9.3*  HCT 22.6* 22.1* 20.3* 26.6* 27.6* 28.5*  MCV 72.7* 72.7*  --  75.1* 75.6* 76.8*  PLT 257 219  --  322 458* 431*   Cardiac Enzymes:  Recent Labs Lab 10/15/13 0800 10/15/13 1345  TROPONINI <0.30 <0.30   BNP: No components found with this basename: POCBNP,  CBG: No results found for this basename: GLUCAP,  in the last 168 hours  If 7PM-7AM, please contact night-coverage [email or web address] Password [email or web address] 10/15/2013, 5:16 PM

## 2013-10-16 LAB — BASIC METABOLIC PANEL
BUN: 8 mg/dL (ref 6–23)
CO2: 26 mEq/L (ref 19–32)
Calcium: 8.5 mg/dL (ref 8.4–10.5)
Chloride: 100 mEq/L (ref 96–112)
Creatinine, Ser: 0.7 mg/dL (ref 0.50–1.10)
GFR calc Af Amer: >90 mL/min (ref >90)
GFR calc non Af Amer: >90 mL/min (ref >90)
Glucose, Bld: 127 mg/dL — ABNORMAL HIGH (ref 70–99)
Potassium: 3 mEq/L — ABNORMAL LOW (ref 3.7–5.3)
Sodium: 140 mEq/L (ref 137–147)

## 2013-10-16 LAB — CBC
HCT: 29 % — ABNORMAL LOW (ref 36.0–46.0)
Hemoglobin: 9.4 g/dL — ABNORMAL LOW (ref 12.0–15.0)
MCH: 25 pg — ABNORMAL LOW (ref 26.0–34.0)
MCHC: 32.4 g/dL (ref 30.0–36.0)
MCV: 77.1 fL — ABNORMAL LOW (ref 78.0–100.0)
Platelets: 473 10*3/uL — ABNORMAL HIGH (ref 150–400)
RBC: 3.76 MIL/uL — ABNORMAL LOW (ref 3.87–5.11)
RDW: 18 % — ABNORMAL HIGH (ref 11.5–15.5)
WBC: 15.7 10*3/uL — ABNORMAL HIGH (ref 4.0–10.5)

## 2013-10-16 LAB — URINE CULTURE: Colony Count: 75000

## 2013-10-16 LAB — TROPONIN I: Troponin I: <0.3 ng/mL (ref <0.30)

## 2013-10-16 MED ORDER — LEVOFLOXACIN 750 MG PO TABS
750.0000 mg | ORAL_TABLET | Freq: Every day | ORAL | Status: DC
  Administered 2013-10-16 – 2013-10-18 (×3): 750 mg via ORAL
  Filled 2013-10-16 (×3): qty 1

## 2013-10-16 MED ORDER — FUROSEMIDE 40 MG PO TABS
40.0000 mg | ORAL_TABLET | Freq: Every day | ORAL | Status: DC
  Administered 2013-10-17 – 2013-10-18 (×2): 40 mg via ORAL
  Filled 2013-10-16 (×2): qty 1

## 2013-10-16 MED ORDER — POTASSIUM CHLORIDE CRYS ER 20 MEQ PO TBCR
40.0000 meq | EXTENDED_RELEASE_TABLET | Freq: Once | ORAL | Status: AC
  Administered 2013-10-16: 40 meq via ORAL
  Filled 2013-10-16: qty 2

## 2013-10-16 NOTE — Progress Notes (Signed)
    Subjective:  Denies CP or dyspnea   Objective:  Filed Vitals:   10/15/13 1536 10/15/13 2115 10/16/13 0542 10/16/13 0634  BP: 145/88 144/95 138/86   Pulse:  109 105   Temp:  98.8 F (37.1 C) 99.5 F (37.5 C)   TempSrc:  Oral Oral   Resp:  20 28   Height:      Weight:    239 lb 9.6 oz (108.682 kg)  SpO2:  98% 99%     Intake/Output from previous day:  Intake/Output Summary (Last 24 hours) at 10/16/13 0738 Last data filed at 10/16/13 5750  Gross per 24 hour  Intake   1145 ml  Output   2800 ml  Net  -1655 ml    Physical Exam: Physical exam: Well-developed well-nourished in no acute distress.  Skin is warm and dry.  HEENT is normal.  Neck is supple.  Chest with diminished BS bases Cardiovascular exam is regular rate and rhythm.  Abdominal exam nontender or distended. No masses palpated. Extremities show no edema. neuro grossly intact    Lab Results: Basic Metabolic Panel:  Recent Labs  51/83/35 0800 10/15/13 1345 10/16/13 0120  NA 142 147 140  K 3.5* 3.9 3.0*  CL 102 104 100  CO2 25 27 26   GLUCOSE 105* 113* 127*  BUN 10 9 8   CREATININE 0.72 0.73 0.70  CALCIUM 8.9 9.1 8.5  MG  --  1.8  --    CBC:  Recent Labs  10/15/13 0800 10/15/13 1345  WBC 14.9* 14.0*  NEUTROABS 11.7* 10.9*  HGB 9.1* 9.3*  HCT 27.6* 28.5*  MCV 75.6* 76.8*  PLT 458* 431*   Cardiac Enzymes:  Recent Labs  10/15/13 1345 10/15/13 1916 10/16/13 0120  TROPONINI <0.30 <0.30 <0.30     Assessment/Plan:  1 dyspnea-patient presented with dyspnea and volume excess possibly related to IV fluids at time of C-section and transfusion. Chest CT showed no pulmonary embolus. Echocardiogram shows preserved LV function. She is much improved following Lasix. She can be discharged from a cardiac standpoint on Lasix 20 mg daily. 2 hypertension-continue present dose of hydralazine. As her volume improves she may not require antihypertensives in the future. 3 iron deficiency  anemia-management per primary care. 4 question pneumonia-management per primary care. Patient can be discharged from a cardiac standpoint and followup with Dr. Patty Sermons in 2 weeks. Please call with questions.  Suzanne [name] 10/16/2013, 7:38 AM

## 2013-10-16 NOTE — Progress Notes (Signed)
Patient has no IV access, IV RN tried but unsuccessful. MD aware.

## 2013-10-16 NOTE — Progress Notes (Signed)
[facility] PROGRESS NOTE  Vergia AlconGenesis L Defreitas OZH:[id number] DOB: 12/04/1985 DOA: 10/15/2013 PCP: No primary provider on file.  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 -Significant improvement with intravenous furosemide -10/15/2012 echocardiogram EF 50-55%, no WMA -CT angiogram chest negative for pulmonary embolus -Appreciate cardiology input -Switch to oral furosemide 10/17/2013 -Continue hydralazine Leukocytosis  -May be related to pneumonia versus UTI  -Patient has significant pyuria  -CTA chest to suggest right lower lobe airspace opacity  -Patient has been receiving vancomycin and cefepime  -She lost IV access this afternoon and multiple attempts were unsuccessful  -Switch the patient to oral levofloxacin and monitor clinically  -Patient is afebrile and hemodynamically stable  -Follow culture data  Acute blood loss anemia -pt received 2 units PRBCs on 10/12/13 -monitor Family Communication:   Pt at beside Disposition Plan:   Home when medically stable     Procedures/Studies: Dg Chest 2 View  10/15/2013   CLINICAL DATA:  Productive cough and shortness of breath.  EXAM: CHEST  2 VIEW  COMPARISON:  PA and lateral chest 10/13/2003.  FINDINGS: Heart size is mildly enlarged. Lungs are clear. No pneumothorax or pleural fluid. No focal bony abnormality.  IMPRESSION: Mild cardiomegaly without acute disease.   Electronically Signed   By: Drusilla Kannerhomas  Dalessio M.D.   On: 10/15/2013 09:08   Dg Chest 2 View  10/13/2013   CLINICAL DATA:  C-section 4 days ago.  Shortness of breath.  EXAM: CHEST  2 VIEW  COMPARISON:  DG CHEST 2 VIEW dated 12/08/2007  FINDINGS: Midline trachea. Mild cardiomegaly. Mediastinal contours otherwise within normal limits. Thickening of the fissures, suggesting trace pleural fluid. No pneumothorax. Lower lobe predominant interstitial and airspace opacities are mild.  IMPRESSION: Borderline cardiomegaly with  lower lobe predominant interstitial and airspace opacities. Suspicious for mild pulmonary venous congestion.   Electronically Signed   By: Jeronimo GreavesKyle  Talbot M.D.   On: 10/13/2013 01:08   Ct Angio Chest Pe W/cm &/or Wo Cm  10/15/2013   CLINICAL DATA:  Short of breath, evaluate for PE  EXAM: CT ANGIOGRAPHY CHEST WITH CONTRAST  TECHNIQUE: Multidetector CT imaging of the chest was performed using the standard protocol during bolus administration of intravenous contrast. Multiplanar CT image reconstructions including MIPs were obtained to evaluate the vascular anatomy.  CONTRAST:  100mL OMNIPAQUE IOHEXOL 350 MG/ML SOLN  COMPARISON:  None.  FINDINGS: Is exam is sub optimal due to body habitus and poor concentration of the contrast bolus. There is no pulmonary embolism within the main pulmonary arteries or interlobar lobar arteries. The segmental pulmonary arteries are poorly evaluated.  No acute findings aorta great vessels. The smaller residual thymus in the anterior mediastinum. No pericardial fluid.  Review of the lung parenchyma demonstrates the right pleural effusion. There is mild airspace disease in the right lower lobe. Mild atelectasis in the left lower lobe. No pneumothorax. Central airways are normal.  Limited view of the upper abdomen is unremarkable. Limited view of the skeleton is unremarkable.  Review of the MIP images confirms the above findings.  IMPRESSION: 1. Suboptimal exam. No filling defects within the main pulmonary arteries are internal lobar pulmonary arteries. The segmental pulmonary arteries are poorly evaluated as above.  2. Right pleural effusion and right lower lobe airspace disease. This could represent atelectasis, pneumonia, or potentially aspiration pneumonitis.   Electronically Signed   By: Genevive BiStewart  Edmunds M.D.   On: 10/15/2013 10:21         Subjective:  patient  is breathing much better. Denies any fevers, chills, chest discomfort, shortness breath, nausea, vomiting, diarrhea,  coughing, hemoptysis. Patient complains of some dysuria.  Objective: Filed Vitals:   10/15/13 2115 10/16/13 0542 10/16/13 0634 10/16/13 1443  BP: 144/95 138/86  145/89  Pulse: 109 105  113  Temp: 98.8 F (37.1 C) 99.5 F (37.5 C)  98 F (36.7 C)  TempSrc: Oral Oral  Oral  Resp: 20 28  24   Height:      Weight:   108.682 kg (239 lb 9.6 oz)   SpO2: 98% 99%  99%    Intake/Output Summary (Last 24 hours) at 10/16/13 1916 Last data filed at 10/16/13 1445  Gross per 24 hour  Intake    880 ml  Output   1600 ml  Net   -720 ml   Weight change:  Exam:   General:  Pt is alert, follows commands appropriately, not in acute distress  HEENT: No icterus, No thrush,  [place]/AT  Cardiovascular: RRR, S1/S2, no rubs, no gallops  Respiratory: CTA bilaterally, no wheezing, no crackles, no rhonchi  Abdomen: Soft/+BS, non tender, non distended, no guarding  Extremities: 1+ LE  edema, No lymphangitis, No petechiae, No rashes, no synovitis  Data Reviewed: Basic Metabolic Panel:  Recent Labs Lab 10/15/13 0800 10/15/13 1345 10/16/13 0120  NA 142 147 140  K 3.5* 3.9 3.0*  CL 102 104 100  CO2 25 27 26   GLUCOSE 105* 113* 127*  BUN 10 9 8   CREATININE 0.72 0.73 0.70  CALCIUM 8.9 9.1 8.5  MG  --  1.8  --    Liver Function Tests:  Recent Labs Lab 10/15/13 1345  AST 27  ALT 26  ALKPHOS 99  BILITOT 0.5  PROT 7.1  ALBUMIN 2.4*   No results found for this basename: LIPASE, AMYLASE,  in the last 168 hours No results found for this basename: AMMONIA,  in the last 168 hours CBC:  Recent Labs Lab 10/12/13 1052 10/13/13 0520 10/15/13 0800 10/15/13 1345 10/16/13 0120  WBC  --  14.7* 14.9* 14.0* 15.7*  NEUTROABS  --   --  11.7* 10.9*  --   HGB 6.6* 8.8* 9.1* 9.3* 9.4*  HCT 20.3* 26.6* 27.6* 28.5* 29.0*  MCV  --  75.1* 75.6* 76.8* 77.1*  PLT  --  322 458* 431* 473*   Cardiac Enzymes:  Recent Labs Lab 10/15/13 0800 10/15/13 1345 10/15/13 1916 10/16/13 0120  TROPONINI <0.30  <0.30 <0.30 <0.30   BNP: No components found with this basename: POCBNP,  CBG: No results found for this basename: GLUCAP,  in the last 168 hours  Recent Results (from the past 240 hour(s))  URINE CULTURE     Status: None   Collection Time    10/15/13  8:05 AM      Result Value Range Status   Specimen Description URINE, CLEAN CATCH   Final   Special Requests NONE   Final   Culture  Setup Time     Final   Value: 10/15/2013 13:25     Performed at [facility] Count     Final   Value: 75,000 COLONIES/ML     Performed at [facility]   Culture     Final   Value: Multiple bacterial morphotypes present, none predominant. Suggest appropriate recollection if clinically indicated.     Performed at [facility]   Report Status 10/16/2013 FINAL   Final     Scheduled Meds: .  enoxaparin (LOVENOX) injection  55 mg Subcutaneous Q24H  . [START ON 10/17/2013] furosemide  40 mg Oral Daily  . hydrALAZINE  25 mg Oral Q8H  . levofloxacin  750 mg Oral Daily  . potassium chloride  20 mEq Oral Daily  . prenatal multivitamin  1 tablet Oral Q1200  . sodium chloride  3 mL Intravenous Q12H   Continuous Infusions:    Eesa Justiss, DO  [facility] Pager [phone]  If 7PM-7AM, please contact night-coverage [email or web address] Password [facility] 10/16/2013, 7:16 PM   LOS: 1 day

## 2013-10-17 ENCOUNTER — Inpatient Hospital Stay (HOSPITAL_COMMUNITY): Payer: Medicaid Other

## 2013-10-17 DIAGNOSIS — D72829 Elevated white blood cell count, unspecified: Secondary | ICD-10-CM

## 2013-10-17 LAB — URINALYSIS W MICROSCOPIC + REFLEX CULTURE
Bilirubin Urine: NEGATIVE
Glucose, UA: NEGATIVE mg/dL
Ketones, ur: 15 mg/dL — AB
Nitrite: NEGATIVE
Protein, ur: NEGATIVE mg/dL
Specific Gravity, Urine: 1.008 (ref 1.005–1.030)
Urobilinogen, UA: 0.2 mg/dL (ref 0.0–1.0)
pH: 7 (ref 5.0–8.0)

## 2013-10-17 LAB — CBC WITH DIFFERENTIAL/PLATELET
Basophils Absolute: 0 10*3/uL (ref 0.0–0.1)
Basophils Relative: 0 % (ref 0–1)
Eosinophils Absolute: 0.3 10*3/uL (ref 0.0–0.7)
Eosinophils Relative: 2 % (ref 0–5)
HCT: 30.9 % — ABNORMAL LOW (ref 36.0–46.0)
Hemoglobin: 10.1 g/dL — ABNORMAL LOW (ref 12.0–15.0)
Lymphocytes Relative: 20 % (ref 12–46)
Lymphs Abs: 3.3 10*3/uL (ref 0.7–4.0)
MCH: 24.9 pg — ABNORMAL LOW (ref 26.0–34.0)
MCHC: 32.7 g/dL (ref 30.0–36.0)
MCV: 76.3 fL — ABNORMAL LOW (ref 78.0–100.0)
Monocytes Absolute: 1 10*3/uL (ref 0.1–1.0)
Monocytes Relative: 6 % (ref 3–12)
Neutro Abs: 12 10*3/uL — ABNORMAL HIGH (ref 1.7–7.7)
Neutrophils Relative %: 72 % (ref 43–77)
Platelets: 414 10*3/uL — ABNORMAL HIGH (ref 150–400)
RBC: 4.05 MIL/uL (ref 3.87–5.11)
RDW: 17.8 % — ABNORMAL HIGH (ref 11.5–15.5)
WBC: 16.6 10*3/uL — ABNORMAL HIGH (ref 4.0–10.5)

## 2013-10-17 LAB — CLOSTRIDIUM DIFFICILE BY PCR: Toxigenic C. Difficile by PCR: NEGATIVE

## 2013-10-17 LAB — BASIC METABOLIC PANEL
BUN: 7 mg/dL (ref 6–23)
CO2: 24 mEq/L (ref 19–32)
Calcium: 8.7 mg/dL (ref 8.4–10.5)
Chloride: 100 mEq/L (ref 96–112)
Creatinine, Ser: 0.68 mg/dL (ref 0.50–1.10)
GFR calc Af Amer: >90 mL/min (ref >90)
GFR calc non Af Amer: >90 mL/min (ref >90)
Glucose, Bld: 94 mg/dL (ref 70–99)
Potassium: 3.9 mEq/L (ref 3.7–5.3)
Sodium: 139 mEq/L (ref 137–147)

## 2013-10-17 MED ORDER — IOHEXOL 300 MG/ML  SOLN
50.0000 mL | INTRAMUSCULAR | Status: AC
  Administered 2013-10-17 (×2): 50 mL via ORAL

## 2013-10-17 MED ORDER — IOHEXOL 300 MG/ML  SOLN
100.0000 mL | Freq: Once | INTRAMUSCULAR | Status: AC | PRN
  Administered 2013-10-17: 100 mL via INTRAVENOUS

## 2013-10-17 NOTE — Progress Notes (Signed)
[facility] PROGRESS NOTE  Suzanne [name] ZOX:[id number] DOB: 06/13/1986 DOA: 10/15/2013 PCP: No primary provider on file.  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 difficile PCR -Repeat UA with reflex to urine culture -Place PICC line--> CT abdomen and pelvis -CTA chest to suggest right lower lobe airspace opacity  -Patient has been receiving vancomycin and cefepime  -She lost IV access 10/16/13 and multiple attempts were unsuccessful  -Switch the patient to oral levofloxacin and monitor clinically  -Patient is afebrile and hemodynamically stable  -Follow culture data  Acute blood loss anemia  -pt received 2 units PRBCs on 10/12/13  -monitor  Family Communication: no family at beside  Disposition Plan: Home when medically stable    Antibiotics:  cefepime 10/15/13>>> 10/16/2013  Levofloxacin 10/16/2013>>>    Procedures/Studies: Dg Chest 2 View  10/15/2013   CLINICAL DATA:  Productive cough and shortness of breath.  EXAM: CHEST  2 VIEW  COMPARISON:  PA and lateral chest 10/13/2003.  FINDINGS: Heart size is mildly enlarged. Lungs are clear. No pneumothorax or pleural fluid. No focal bony abnormality.  IMPRESSION: Mild cardiomegaly without acute disease.   Electronically Signed   By: Drusilla Kannerhomas  Dalessio M.D.   On: 10/15/2013 09:08   Dg Chest 2 View  10/13/2013   CLINICAL DATA:  C-section 4 days ago.  Shortness of breath.  EXAM: CHEST  2 VIEW  COMPARISON:  DG CHEST 2 VIEW dated 12/08/2007  FINDINGS: Midline trachea. Mild  cardiomegaly. Mediastinal contours otherwise within normal limits. Thickening of the fissures, suggesting trace pleural fluid. No pneumothorax. Lower lobe predominant interstitial and airspace opacities are mild.  IMPRESSION: Borderline cardiomegaly with lower lobe predominant interstitial and airspace opacities. Suspicious for mild pulmonary venous congestion.   Electronically Signed   By: Jeronimo GreavesKyle  Talbot M.D.   On: 10/13/2013 01:08   Ct Angio Chest Pe W/cm &/or Wo Cm  10/15/2013   CLINICAL DATA:  Short of breath, evaluate for PE  EXAM: CT ANGIOGRAPHY CHEST WITH CONTRAST  TECHNIQUE: Multidetector CT imaging of the chest was performed using the standard protocol during bolus administration of intravenous contrast. Multiplanar CT image reconstructions including MIPs were obtained to evaluate the vascular anatomy.  CONTRAST:  100mL OMNIPAQUE IOHEXOL 350 MG/ML SOLN  COMPARISON:  None.  FINDINGS: Is exam is sub optimal due to body habitus and poor concentration of the contrast bolus. There is no pulmonary embolism within the main pulmonary arteries or interlobar lobar arteries. The segmental pulmonary arteries are poorly evaluated.  No acute findings aorta great vessels. The smaller residual thymus in the anterior mediastinum. No pericardial fluid.  Review of the lung parenchyma demonstrates the right pleural effusion. There is mild airspace disease in the right lower lobe. Mild atelectasis in the left lower lobe. No pneumothorax. Central airways are normal.  Limited view of the upper abdomen is unremarkable. Limited view of the skeleton is unremarkable.  Review of the MIP images confirms the above findings.  IMPRESSION: 1. Suboptimal exam. No filling defects within the main pulmonary arteries are internal lobar pulmonary arteries. The segmental pulmonary arteries are poorly evaluated as above.  2. Right pleural  effusion and right lower lobe airspace disease. This could represent atelectasis, pneumonia, or potentially  aspiration pneumonitis.   Electronically Signed   By: Genevive Bi M.D.   On: 10/15/2013 10:21         Subjective: Patient denies fevers, chills, chest pain, shortness of breath, nausea, vomiting, diarrhea. She had 4 loose stools yesterday. No hematochezia or melena. No dysuria. She has some pain at her C-section incisional site.  Objective: Filed Vitals:   10/16/13 1443 10/16/13 2117 10/17/13 0523 10/17/13 0627  BP: 145/89 136/91 134/89   Pulse: 113 118 111   Temp: 98 F (36.7 C) 99.1 F (37.3 C) 99.3 F (37.4 C)   TempSrc: Oral Oral Oral   Resp: 24 22    Height:      Weight:    107.457 kg (236 lb 14.4 oz)  SpO2: 99% 99% 99%     Intake/Output Summary (Last 24 hours) at 10/17/13 0853 Last data filed at 10/17/13 0011  Gross per 24 hour  Intake    340 ml  Output   1850 ml  Net  -1510 ml   Weight change: -7.938 kg (-17 lb 8 oz) Exam:   General:  Pt is alert, follows commands appropriately, not in acute distress  HEENT: No icterus, No thrush,  [place]/AT  Cardiovascular: RRR, S1/S2, no rubs, no gallops  Respiratory: CTA bilaterally, no wheezing, no crackles, no rhonchi  Abdomen: Soft/+BS, mild tenderness along C-section incision site--no erythema, drainage, crepitance, induration, non distended, no guarding  Extremities: 1+ LE  edema, No lymphangitis, No petechiae, No rashes, no synovitis  Data Reviewed: Basic Metabolic Panel:  Recent Labs Lab 10/15/13 0800 10/15/13 1345 10/16/13 0120 10/17/13 0521  NA 142 147 140 139  K 3.5* 3.9 3.0* 3.9  CL 102 104 100 100  CO2 25 27 26 24   GLUCOSE 105* 113* 127* 94  BUN 10 9 8 7   CREATININE 0.72 0.73 0.70 0.68  CALCIUM 8.9 9.1 8.5 8.7  MG  --  1.8  --   --    Liver Function Tests:  Recent Labs Lab 10/15/13 1345  AST 27  ALT 26  ALKPHOS 99  BILITOT 0.5  PROT 7.1  ALBUMIN 2.4*   No results found for this basename: LIPASE, AMYLASE,  in the last 168 hours No results found for this basename: AMMONIA,  in the  last 168 hours CBC:  Recent Labs Lab 10/13/13 0520 10/15/13 0800 10/15/13 1345 10/16/13 0120 10/17/13 0521  WBC 14.7* 14.9* 14.0* 15.7* 16.6*  NEUTROABS  --  11.7* 10.9*  --  12.0*  HGB 8.8* 9.1* 9.3* 9.4* 10.1*  HCT 26.6* 27.6* 28.5* 29.0* 30.9*  MCV 75.1* 75.6* 76.8* 77.1* 76.3*  PLT 322 458* 431* 473* 414*   Cardiac Enzymes:  Recent Labs Lab 10/15/13 0800 10/15/13 1345 10/15/13 1916 10/16/13 0120  TROPONINI <0.30 <0.30 <0.30 <0.30   BNP: No components found with this basename: POCBNP,  CBG: No results found for this basename: GLUCAP,  in the last 168 hours  Recent Results (from the past 240 hour(s))  URINE CULTURE     Status: None   Collection Time    10/15/13  8:05 AM      Result Value Range Status   Specimen Description URINE, CLEAN CATCH   Final   Special Requests NONE   Final   Culture  Setup Time     Final   Value: 10/15/2013 13:25     Performed at [facility]  [place] Count     Final   Value: 75,000 COLONIES/ML     Performed at [facility]   Culture     Final   Value: Multiple bacterial morphotypes present, none predominant. Suggest appropriate recollection if clinically indicated.     Performed at [facility]   Report Status 10/16/2013 FINAL   Final     Scheduled Meds: . enoxaparin (LOVENOX) injection  55 mg Subcutaneous Q24H  . furosemide  40 mg Oral Daily  . hydrALAZINE  25 mg Oral Q8H  . levofloxacin  750 mg Oral Daily  . potassium chloride  20 mEq Oral Daily  . prenatal multivitamin  1 tablet Oral Q1200  . sodium chloride  3 mL Intravenous Q12H   Continuous Infusions:    Telisa Ohlsen, DO  [facility] Pager [phone]  If 7PM-7AM, please contact night-coverage [email or web address] Password [facility] 10/17/2013, 8:53 AM   LOS: 2 days

## 2013-10-17 NOTE — Progress Notes (Signed)
Pt states she does not want a PICC line. She states she does not think she needs one and does not want anything in her chest.  Merla Riches RN and Wray Kearns RN (IV team) notified of pt's decision to refuse PICC.  Consuello Masse

## 2013-10-18 DIAGNOSIS — I5031 Acute diastolic (congestive) heart failure: Secondary | ICD-10-CM

## 2013-10-18 DIAGNOSIS — J189 Pneumonia, unspecified organism: Secondary | ICD-10-CM

## 2013-10-18 HISTORY — DX: Acute diastolic (congestive) heart failure: I50.31

## 2013-10-18 LAB — CBC WITH DIFFERENTIAL/PLATELET
Basophils Absolute: 0 10*3/uL (ref 0.0–0.1)
Basophils Relative: 0 % (ref 0–1)
Eosinophils Absolute: 0.2 10*3/uL (ref 0.0–0.7)
Eosinophils Relative: 2 % (ref 0–5)
HCT: 33.7 % — ABNORMAL LOW (ref 36.0–46.0)
Hemoglobin: 10.9 g/dL — ABNORMAL LOW (ref 12.0–15.0)
Lymphocytes Relative: 17 % (ref 12–46)
Lymphs Abs: 1.5 10*3/uL (ref 0.7–4.0)
MCH: 24.7 pg — ABNORMAL LOW (ref 26.0–34.0)
MCHC: 32.3 g/dL (ref 30.0–36.0)
MCV: 76.2 fL — ABNORMAL LOW (ref 78.0–100.0)
Monocytes Absolute: 0.8 10*3/uL (ref 0.1–1.0)
Monocytes Relative: 9 % (ref 3–12)
Neutro Abs: 6.5 10*3/uL (ref 1.7–7.7)
Neutrophils Relative %: 72 % (ref 43–77)
Platelets: ADEQUATE 10*3/uL (ref 150–400)
RBC: 4.42 MIL/uL (ref 3.87–5.11)
RDW: 18.1 % — ABNORMAL HIGH (ref 11.5–15.5)
WBC: 9 10*3/uL (ref 4.0–10.5)

## 2013-10-18 LAB — BASIC METABOLIC PANEL
BUN: 6 mg/dL (ref 6–23)
CO2: 23 mEq/L (ref 19–32)
Calcium: 9.1 mg/dL (ref 8.4–10.5)
Chloride: 101 mEq/L (ref 96–112)
Creatinine, Ser: 0.67 mg/dL (ref 0.50–1.10)
GFR calc Af Amer: >90 mL/min (ref >90)
GFR calc non Af Amer: >90 mL/min (ref >90)
Glucose, Bld: 93 mg/dL (ref 70–99)
Potassium: 4.5 mEq/L (ref 3.7–5.3)
Sodium: 138 mEq/L (ref 137–147)

## 2013-10-18 MED ORDER — METOPROLOL TARTRATE 12.5 MG HALF TABLET: 12.5000 mg | ORAL_TABLET | Freq: Two times a day (BID) | ORAL | Status: DC

## 2013-10-18 MED ORDER — DOXYCYCLINE HYCLATE 100 MG PO TABS: 100.0000 mg | ORAL_TABLET | Freq: Two times a day (BID) | ORAL | Status: DC

## 2013-10-18 MED ORDER — DOXYCYCLINE HYCLATE 100 MG PO TABS
100.0000 mg | ORAL_TABLET | Freq: Two times a day (BID) | ORAL | Status: DC
  Filled 2013-10-18 (×2): qty 1

## 2013-10-18 MED ORDER — FUROSEMIDE 20 MG PO TABS: 20.0000 mg | ORAL_TABLET | Freq: Every day | ORAL | Status: DC

## 2013-10-18 MED ORDER — LEVOFLOXACIN 750 MG PO TABS: 750.0000 mg | ORAL_TABLET | Freq: Every day | ORAL | Status: DC

## 2013-10-18 MED ORDER — HYDRALAZINE HCL 25 MG PO TABS: 25.0000 mg | ORAL_TABLET | Freq: Three times a day (TID) | ORAL | Status: DC

## 2013-10-18 MED ORDER — METOPROLOL TARTRATE 25 MG PO TABS: 12.5000 mg | ORAL_TABLET | Freq: Two times a day (BID) | ORAL | Status: DC

## 2013-10-18 MED ORDER — METOPROLOL TARTRATE 12.5 MG HALF TABLET
12.5000 mg | ORAL_TABLET | Freq: Two times a day (BID) | ORAL | Status: DC
  Filled 2013-10-18 (×2): qty 1

## 2013-10-18 NOTE — Discharge Summary (Signed)
Physician Discharge Summary  GENESEE AFFELDT SEL:[id number] DOB: 03-Aug-1986 DOA: 10/15/2013  PCP: No primary provider on file.  Admit date: 10/15/2013 Discharge date: 10/18/2013  Recommendations for Outpatient Follow-up:  1. Pt will need to follow up with PCP in 2 weeks post discharge 2. Please obtain BMP to evaluate electrolytes and kidney function in one week 3. 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 difficile PCR--negative  -Repeat UA with reflex to urine culture--no pyuria  -CT abdomen and pelvis--revealed air-fluid density on the left of the patient's cesarean section incision -Consulted Dr. Gaynell Face whom opened the area and packed it -Patient has followup appointment with OBGYN on 10/20/13 -home with doxycycline 100mg  bid x 10 days  -CTA chest to suggest right lower lobe airspace opacity  -Patient had been receiving vancomycin and cefepime  -She lost IV access 10/16/13 and multiple attempts were unsuccessful  -Switch the patient to oral levofloxacin and monitor clinically  -Patient is afebrile and hemodynamically stable  -Patient will go home with her additional dose of levofloxacin which will  complete a 7 day therapy. The patient is not planning to breast-feed her baby. Acute blood loss anemia  -pt received 2 units PRBCs on 10/12/13  -monitor  Family Communication: no family at beside  Disposition Plan: Home when medically stable  Antibiotics:  cefepime 10/15/13>>> 10/16/2013  Levofloxacin 10/16/2013>>> Doxycycline 10/18/13>>>   Discharge Condition: Stable  Disposition:  Follow-up Information   Follow up with Cassell Clement, MD In 2 weeks.   Specialty:  Cardiology   Contact information:   [address] [place] [address] [phone]       Diet:cardiac Wt Readings from Last 3 Encounters:  10/18/13 106.7 kg (235 lb 3.7 oz)  10/14/13 114.647 kg (252 lb 12 oz)  10/07/13 117.482 kg (259 lb)    History of present illness:  28 year old female who had recent C-section (about 1 week prior to this admission) who presented to [facility] ED 10/15/2013 with worsening shortness of breath over past 24 hours prior to this admission. She initially presented to [facility] hospital with dyspnea on 10/12/2013 and she was admitted for symptomatic anemia with transfusion of 2 units of PRBC's. Her shortness of breath worsened and she presented to [facility]. On admission, she reports shortness of breath but no chest pain, no LE swelling. No palpitations. No lightheadedness or LOC. No abdominal pain, no nausea or vomiting.  In ED, BP was 130/62, HR 101-115, Tmax 99.6 F and oxygen saturation of 95% on room air. Her WBC count was 14 (on 1/19 it was 14.9), hemoglobin was 9.1. Cardiac enzymes were negative. BNP was elevated at ~1400. She was started on lasix 40 mg IV Q 12 hours. The 12 lead EKG showed sinus tachycardia. Cardiology was consulted for input on management of possible  postpartum cardiomyopathy. CTA chest showed no filling defects within the main pulmonary arteries; also seen was right pleural effusion and right lower lobe airspace disease. The patient was initially started on empiric  vancomycin and cefepime. She was transitioned to oral antibiotics. WBC count continued to increase. CT of the abdomen and pelvis was ordered. It showed an air-fluid density on the left side of her C-section incision. OB/GYN was consulted and opened up the wound. The patient's WBC count decreased. She'll be sent home with doxycycline for 10 days.     Consultants: Cardiology--Dr. [facility]--Dr. Marshall  Discharge Exam: Filed Vitals:   10/18/13 0440  BP: 138/79  Pulse: 112  Temp: 100.7 F (38.2 C)  Resp: 19   Filed Vitals:   10/17/13 0627 10/17/13 1504 10/17/13 2020 10/18/13 0440  BP:  139/77 133/88 138/79  Pulse:  92 116 112  Temp:  98.1 F (36.7 C) 98.9 F (37.2 C) 100.7 F (38.2 C)  TempSrc:  Oral Oral Oral  Resp:  18 18 19   Height:      Weight: 107.457 kg (236 lb 14.4 oz)   106.7 kg (235 lb 3.7 oz)  SpO2:  92% 100% 98%   General: A&O x 3, NAD, pleasant, cooperative Cardiovascular: RRR, no rub, no gallop, no S3 Respiratory: CTAB, no wheeze, no rhonchi Abdomen:soft, nontender, nondistended, positive bowel sounds Extremities: 1+ LE edema, No lymphangitis, no petechiae  Discharge Instructions      Discharge Orders   Future Appointments Provider Department Dept Phone   10/20/2013 10:15 AM Brock Badharles A Harper, MD [facility] [phone]   10/31/2013 9:00 AM Rosalio MacadamiaLori C Gerhardt, NP [facility] [address] Office [phone]   Future Orders Complete By Expires   Diet - low sodium heart healthy  As directed    Discharge instructions  As directed    Comments:     Start Levaquin on 10/19/13 Start doxycycline today--10/18/13   Increase activity slowly  As directed        Medication List    STOP taking these medications       ibuprofen 600 MG tablet  Commonly known as:  ADVIL,MOTRIN      TAKE these medications       doxycycline 100 MG tablet  Commonly known as:  VIBRA-TABS  Take 1 tablet (100 mg total) by mouth every 12 (twelve) hours.     furosemide  20 MG tablet  Commonly known as:  LASIX  Take 1 tablet (20 mg total) by mouth daily.  Start taking on:  10/19/2013     FUSION PLUS Caps  Take 1 capsule by mouth daily before breakfast.     hydrALAZINE 25 MG tablet  Commonly known as:  APRESOLINE  Take 1 tablet (25 mg total) by mouth every 8 (eight) hours.     levofloxacin 750 MG tablet  Commonly known as:  LEVAQUIN  Take 1 tablet (750 mg total) by mouth daily.     metoprolol tartrate 12.5 mg Tabs tablet  Commonly known as:  LOPRESSOR  Take 0.5 tablets (12.5 mg total) by mouth 2 (two) times daily.     prenatal multivitamin Tabs tablet  Take 1 tablet by mouth daily at 12 noon.         The results of significant diagnostics from this hospitalization (including imaging, microbiology, ancillary and laboratory) are listed below for reference.    Significant Diagnostic Studies: Dg Chest 2 View  10/15/2013   CLINICAL DATA:  Productive cough and shortness of breath.  EXAM: CHEST  2 VIEW  COMPARISON:  PA and lateral chest 10/13/2003.  FINDINGS: Heart size is mildly enlarged. Lungs are clear. No pneumothorax or pleural fluid. No focal bony abnormality.  IMPRESSION: Mild cardiomegaly without acute disease.   Electronically Signed   By: Drusilla Kanner M.D.   On: 10/15/2013 09:08   Dg Chest 2 View  10/13/2013   CLINICAL DATA:  C-section 4 days ago.  Shortness of breath.  EXAM: CHEST  2 VIEW  COMPARISON:  DG CHEST 2 VIEW dated 12/08/2007  FINDINGS: Midline trachea. Mild cardiomegaly. Mediastinal contours otherwise within normal limits. Thickening of the fissures, suggesting trace pleural fluid. No pneumothorax. Lower lobe predominant interstitial and airspace opacities are mild.  IMPRESSION: Borderline cardiomegaly with lower lobe predominant interstitial and airspace opacities. Suspicious for mild pulmonary venous congestion.   Electronically Signed   By: Jeronimo Greaves M.D.   On: 10/13/2013 01:08   Ct Angio Chest Pe W/cm &/or Wo Cm  10/15/2013    CLINICAL DATA:  Short of breath, evaluate for PE  EXAM: CT ANGIOGRAPHY CHEST WITH CONTRAST  TECHNIQUE: Multidetector CT imaging of the chest was performed using the standard protocol during bolus administration of intravenous contrast. Multiplanar CT image reconstructions including MIPs were obtained to evaluate the vascular anatomy.  CONTRAST:  OMNIPAQUE IOHEXOL 350 MG/ML SOLN  COMPARISON:  None.  FINDINGS: Is exam is sub optimal due to body habitus and poor concentration of the contrast bolus. There is no pulmonary embolism within the main pulmonary arteries or interlobar lobar arteries. The segmental pulmonary arteries are poorly evaluated.  No acute findings aorta great vessels. The smaller residual thymus in the anterior mediastinum. No pericardial fluid.  Review of the lung parenchyma demonstrates the right pleural effusion. There is mild airspace disease in the right lower lobe. Mild atelectasis in the left lower lobe. No pneumothorax. Central airways are normal.  Limited view of the upper abdomen is unremarkable. Limited view of the skeleton is unremarkable.  Review of the MIP images confirms the above findings.  IMPRESSION: 1. Suboptimal exam. No filling defects within the main pulmonary arteries are internal lobar pulmonary arteries. The segmental pulmonary arteries are poorly evaluated as above.  2. Right pleural effusion and right lower lobe airspace disease. This could represent atelectasis, pneumonia, or potentially aspiration pneumonitis.   Electronically Signed   By: Genevive Bi M.D.   On: 10/15/2013 10:21   Ct Abdomen Pelvis W Contrast  10/17/2013   CLINICAL DATA:  Lower abdominal pain. Increasing leukocytosis. C-section on 10/08/2012.  EXAM: CT ABDOMEN AND PELVIS WITH CONTRAST  TECHNIQUE: Multidetector CT imaging of the abdomen and pelvis was performed using the standard protocol following bolus administration of intravenous contrast.  CONTRAST:  OMNIPAQUE IOHEXOL 300 MG/ML   SOLN  COMPARISON:  None.  FINDINGS: There is a small amount of air and fluid in the C-section defect in the anterior inferior aspect of the uterus with an ill-defined area of mixed density at the left side of the C-section site which may represent a hematoma or abscess. It measures approximately 7.9 x 8.4 x 3.5 cm.  Ovaries appear normal. 2 cm fibroid on the anterior superior right lateral aspect of the uterus. The patient has hepatomegaly of unknown etiology. This pushes the right kidney inferiorly and medially.  The spleen, pancreas, right adrenal gland, and kidneys are otherwise normal. 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  Bowel is normal. There is the expected degree of soft  tissue stranding in the subcutaneous fat at the site of the a C-section incision. No free air in the abdomen. No ascites.  IMPRESSION: 1. Air and inhomogeneous density in the C-section incision in the lower uterine segment within inhomogeneous soft tissue density at the left side of the incision which could represent a hematoma or less likely an abscess. 2. Hepatomegaly of unknown etiology.   Electronically Signed   By: Geanie Cooley M.D.   On: 10/17/2013 15:26     Microbiology: Recent Results (from the past 240 hour(s))  URINE CULTURE     Status: None   Collection Time    10/15/13  8:05 AM      Result Value Range Status   Specimen Description URINE, CLEAN CATCH   Final   Special Requests NONE   Final   Culture  Setup Time     Final   Value: 10/15/2013 13:25     Performed at [facility] Count     Final   Value: 75,000 COLONIES/ML     Performed at [facility]   Culture     Final   Value: Multiple bacterial morphotypes present, none predominant. Suggest appropriate recollection if clinically indicated.     Performed at [facility]   Report Status 10/16/2013 FINAL   Final  CLOSTRIDIUM DIFFICILE BY PCR     Status: None   Collection Time    10/17/13  2:59 PM      Result  Value Range Status   C difficile by pcr NEGATIVE  NEGATIVE Final   Comment: Performed at [facility]     Labs: Basic Metabolic Panel:  Recent Labs Lab 10/15/13 0800 10/15/13 1345 10/16/13 0120 10/17/13 0521 10/18/13 0510  NA 142 147 140 139 138  K 3.5* 3.9 3.0* 3.9 4.5  CL 102 104 100 100 101  CO2 25 27 26 24 23   GLUCOSE 105* 113* 127* 94 93  BUN 10 9 8 7 6   CREATININE 0.72 0.73 0.70 0.68 0.67  CALCIUM 8.9 9.1 8.5 8.7 9.1  MG  --  1.8  --   --   --    Liver Function Tests:  Recent Labs Lab 10/15/13 1345  AST 27  ALT 26  ALKPHOS 99  BILITOT 0.5  PROT 7.1  ALBUMIN 2.4*   No results found for this basename: LIPASE, AMYLASE,  in the last 168 hours No results found for this basename: AMMONIA,  in the last 168 hours CBC:  Recent Labs Lab 10/15/13 0800 10/15/13 1345 10/16/13 0120 10/17/13 0521 10/18/13 0510  WBC 14.9* 14.0* 15.7* 16.6* 9.0  NEUTROABS 11.7* 10.9*  --  12.0* 6.5  HGB 9.1* 9.3* 9.4* 10.1* 10.9*  HCT 27.6* 28.5* 29.0* 30.9* 33.7*  MCV 75.6* 76.8* 77.1* 76.3* 76.2*  PLT 458* 431* 473* 414* PLATELET CLUMPS NOTED ON SMEAR, COUNT APPEARS ADEQUATE   Cardiac Enzymes:  Recent Labs Lab 10/15/13 0800 10/15/13 1345 10/15/13 1916 10/16/13 0120  TROPONINI <0.30 <0.30 <0.30 <0.30   BNP: No components found with this basename: POCBNP,  CBG: No results found for this basename: GLUCAP,  in the last 168 hours  Time coordinating discharge:  Greater than 30 minutes  Signed:  Jakerra Floyd, DO [facility] Pager: [phone] 10/18/2013, 11:16 AM

## 2013-10-20 ENCOUNTER — Ambulatory Visit (INDEPENDENT_AMBULATORY_CARE_PROVIDER_SITE_OTHER): Payer: Medicaid Other | Admitting: Obstetrics & Gynecology

## 2013-10-20 ENCOUNTER — Encounter: Payer: Self-pay | Admitting: Obstetrics

## 2013-10-20 ENCOUNTER — Encounter: Payer: Self-pay | Admitting: Obstetrics & Gynecology

## 2013-10-20 ENCOUNTER — Ambulatory Visit (INDEPENDENT_AMBULATORY_CARE_PROVIDER_SITE_OTHER): Payer: Medicaid Other | Admitting: Obstetrics

## 2013-10-20 DIAGNOSIS — O9089 Other complications of the puerperium, not elsewhere classified: Secondary | ICD-10-CM | POA: Insufficient documentation

## 2013-10-20 DIAGNOSIS — O909 Complication of the puerperium, unspecified: Secondary | ICD-10-CM

## 2013-10-20 MED ORDER — OXYCODONE-ACETAMINOPHEN 5-325 MG PO TABS: 2.0000 | ORAL_TABLET | Freq: Four times a day (QID) | ORAL | Status: DC | PRN

## 2013-10-20 NOTE — Progress Notes (Signed)
Subjective:     Suzanne [name] is a 28 y.o. female who presents for a postpartum visit. She is 2 weeks postpartum following a low cervical transverse Cesarean section. I have fully reviewed the prenatal and intrapartum course. The delivery was at 40 gestational weeks. Outcome: primary cesarean section, low transverse incision. Anesthesia: epidural. Postpartum course has been Complicated by wound infection and extended hospital stay. Baby's course has been WNL. Baby is feeding by bottle - Gerber Gentle.. Bleeding moderate lochia. Bowel function is normal. Bladder function is normal. Patient is not sexually active. Contraception method is abstinence. Postpartum depression screening: negative.  The following portions of the patient's history were reviewed and updated as appropriate: allergies, current medications, past family history, past medical history, past social history, past surgical history and problem list.  Review of Systems Pertinent items are noted in HPI.   Objective:    BP 125/83  Pulse 106  Temp(Src) 98.1 F (36.7 C)  Ht 4\' 9"  (1.448 m)  Wt 228 lb (103.42 kg)  BMI 49.33 kg/m2  Breastfeeding? No  General:  alert and no distress Breasts:  Negative Abdomen:  Soft, NT.  Incision C, D, I.                                      Assessment:     Normal postpartum exam. Pap smear not done at today's visit.   Plan:    1. Contraception: abstinence 2. Contraceptive options discussed 3. Follow up in: 6 weeks or as needed.

## 2013-10-20 NOTE — Progress Notes (Signed)
Subjective:     Suzanne [name] is a 28 y.o. female who presents today for wound check. Patient has a Pfannenstiel incision  wound with serous drainage.  Recently diagnosed with a wound infection during a hospitalization.  Objective:      Wound:   2+ induration sutures removed without difficulty serous exudate noted The right 1/2 of the incision was prepped/infiltrated with 1% lidocaine.  The incision was incised with the scalpel.  The fascia was intact.  There were no necrotic areas/fibrinous exudate.  The wound was irrigated/packed with Kerlex.   A dressing was applied.      Assessment:   Superficial wound separation Candidate for wound vac   Plan:    Continue dressing changes daily until wound vac placed

## 2013-10-21 ENCOUNTER — Ambulatory Visit (INDEPENDENT_AMBULATORY_CARE_PROVIDER_SITE_OTHER): Payer: Medicaid Other | Admitting: Obstetrics

## 2013-10-21 ENCOUNTER — Encounter: Payer: Self-pay | Admitting: Obstetrics

## 2013-10-21 VITALS — BP 109/75 | HR 103 | Temp 98.5°F | Ht 59.0 in | Wt 228.0 lb

## 2013-10-21 DIAGNOSIS — O9089 Other complications of the puerperium, not elsewhere classified: Secondary | ICD-10-CM

## 2013-10-21 DIAGNOSIS — O909 Complication of the puerperium, unspecified: Secondary | ICD-10-CM

## 2013-10-21 NOTE — Progress Notes (Signed)
Subjective:     Suzanne [name] is a 28 y.o. female here for dressing change.  Current complaints: denies drainage, pain, fever.     Gynecologic History   Obstetric History OB History  Gravida Para Term Preterm AB SAB TAB Ectopic Multiple Living  1 1 1       1     # Outcome Date GA Lbr Len/2nd Weight Sex Delivery Anes PTL Lv  1 TRM 10/08/13 [redacted]w[redacted]d 10:33 / 04:20 7 lb 12.7 oz (3.535 kg) M LTCS EPI  Y     Comments: meconium-stained       The following portions of the patient's history were reviewed and updated as appropriate: allergies, current medications, past family history, past medical history, past social history, past surgical history and problem list.  Review of Systems Pertinent items are noted in HPI.    Objective:    General appearance: alert and no distress Abdomen: Incision clean.  Packing removed and replaced with saline moistened Kerlex dressing.                   Assessment:    Wound infection.  Dressing changed.   Plan:    Follow up in: 1 day.    Wound Vac planned.

## 2013-10-22 ENCOUNTER — Encounter: Payer: Self-pay | Admitting: Obstetrics & Gynecology

## 2013-10-22 ENCOUNTER — Ambulatory Visit (INDEPENDENT_AMBULATORY_CARE_PROVIDER_SITE_OTHER): Payer: Medicaid Other | Admitting: Obstetrics & Gynecology

## 2013-10-22 VITALS — BP 120/81 | HR 103 | Temp 98.4°F | Wt 228.0 lb

## 2013-10-22 DIAGNOSIS — O9089 Other complications of the puerperium, not elsewhere classified: Secondary | ICD-10-CM

## 2013-10-22 DIAGNOSIS — O909 Complication of the puerperium, unspecified: Secondary | ICD-10-CM

## 2013-10-22 NOTE — Progress Notes (Signed)
Subjective:     Suzanne [name] is a 28 y.o. female who presents today for wound check. Patient has a superficial wound separation s/p C/D  Objective:      Wound:   Granulation tissue present. Packing/dressing placed     Assessment:   Superficial wound separation    Plan:   Wound vac today Return in 1 week

## 2013-10-27 ENCOUNTER — Encounter: Payer: Self-pay | Admitting: Obstetrics & Gynecology

## 2013-10-27 ENCOUNTER — Ambulatory Visit (INDEPENDENT_AMBULATORY_CARE_PROVIDER_SITE_OTHER): Payer: Medicaid Other | Admitting: Obstetrics & Gynecology

## 2013-10-27 VITALS — BP 122/86 | HR 97 | Temp 98.1°F | Ht <= 58 in | Wt 232.0 lb

## 2013-10-27 DIAGNOSIS — O909 Complication of the puerperium, unspecified: Secondary | ICD-10-CM

## 2013-10-27 DIAGNOSIS — O9089 Other complications of the puerperium, not elsewhere classified: Secondary | ICD-10-CM

## 2013-10-27 NOTE — Progress Notes (Signed)
Subjective:     Suzanne [name] is a 28 y.o. female who presents for a postpartum visit. She is 3 weeks postpartum following a low cervical transverse Cesarean section. I have fully reviewed the prenatal and intrapartum course. The delivery was at 40 gestational weeks. Outcome: primary cesarean section, low transverse incision. Anesthesia: epidural. Postpartum course has had some compliactions with incision site, Patient states she has not been having any issues with the wound or wound Vac. Baby's course has been WNL. Baby is feeding by bottle - Gerber Gentle . Bleeding brown. Bowel function is normal. Bladder function is normal. Patient is not sexually active. Contraception method is abstinence. Postpartum depression screening: negative.  The following portions of the patient's history were reviewed and updated as appropriate: allergies, current medications, past family history, past medical history, past social history, past surgical history and problem list.  Review of Systems Pertinent items are noted in HPI.   Objective:    BP 122/86  Pulse 97  Temp(Src) 98.1 F (36.7 C)  Ht 4\' 9"  (1.448 m)  Wt 232 lb (105.235 kg)  BMI 50.19 kg/m2  Breastfeeding? No      Abd: wound vac in place; no induratoin  Assessment:  Superficial wound separation--clinically improving   Plan:   Follow up in: 1 week or as needed.

## 2013-10-28 ENCOUNTER — Encounter: Payer: Self-pay | Admitting: Obstetrics & Gynecology

## 2013-10-31 ENCOUNTER — Encounter: Payer: Medicaid Other | Admitting: Nurse Practitioner

## 2013-11-03 ENCOUNTER — Ambulatory Visit (INDEPENDENT_AMBULATORY_CARE_PROVIDER_SITE_OTHER): Payer: Medicaid Other | Admitting: Obstetrics & Gynecology

## 2013-11-03 ENCOUNTER — Telehealth: Payer: Self-pay | Admitting: *Deleted

## 2013-11-03 ENCOUNTER — Encounter: Payer: Self-pay | Admitting: Obstetrics & Gynecology

## 2013-11-03 VITALS — BP 135/92 | HR 91 | Temp 97.5°F | Ht 59.0 in | Wt 231.0 lb

## 2013-11-03 DIAGNOSIS — O909 Complication of the puerperium, unspecified: Secondary | ICD-10-CM

## 2013-11-03 DIAGNOSIS — O9089 Other complications of the puerperium, not elsewhere classified: Secondary | ICD-10-CM

## 2013-11-03 NOTE — Telephone Encounter (Signed)
Call to [facility] per Dr Tamela Oddi to let them know when depth of wound is 1cm they can remove the wound vac and start wet to dry dressing care. Spoke to Cindy/Nicole and the measurements are less- wound vac was discontinued and they are to follow wound care for the next couple weeks- things are looking very good.

## 2013-11-03 NOTE — Progress Notes (Signed)
Subjective:     Suzanne [name] is a 28 y.o. female who presents for a wound check  Review of Systems Pertinent items are noted in HPI.   Objective:    BP 135/92  Pulse 91  Temp(Src) 97.5 F (36.4 C)  Ht 4\' 11"  (1.499 m)  Wt 231 lb (104.781 kg)  BMI 46.63 kg/m2      Abd: wound vac in place; no induratoin  Assessment:  Superficial wound separation--clinically improving   Plan:   Follow up in: 1 week or as needed.

## 2013-11-10 ENCOUNTER — Encounter: Payer: Self-pay | Admitting: Obstetrics & Gynecology

## 2013-11-12 ENCOUNTER — Encounter: Payer: Self-pay | Admitting: Obstetrics & Gynecology

## 2013-11-12 ENCOUNTER — Ambulatory Visit (INDEPENDENT_AMBULATORY_CARE_PROVIDER_SITE_OTHER): Payer: Medicaid Other | Admitting: Obstetrics & Gynecology

## 2013-11-12 VITALS — BP 119/81 | HR 92 | Temp 98.7°F | Ht 59.0 in | Wt 226.0 lb

## 2013-11-12 DIAGNOSIS — O909 Complication of the puerperium, unspecified: Secondary | ICD-10-CM

## 2013-11-12 DIAGNOSIS — O9089 Other complications of the puerperium, not elsewhere classified: Secondary | ICD-10-CM

## 2013-11-12 NOTE — Progress Notes (Signed)
Subjective:     Suzanne [name] is a 28 y.o. female who presents for a postpartum visit. She is 4 week postpartum following a low cervical transverse Cesarean section. I have fully reviewed the prenatal and intrapartum course. The delivery was at 40 gestational weeks. Outcome: primary cesarean section, low transverse incision. Anesthesia: epidural. Postpartum course has been WNL. Baby's course has been WNL. Baby is feeding by bottle - Gerber Gentle . Bleeding moderate lochia on and off. Bowel function is normal. Bladder function is normal. Patient is not sexually active. Contraception method is coitus interruptus. Postpartum depression screening: negative.  The following portions of the patient's history were reviewed and updated as appropriate: allergies, current medications, past family history, past medical history, past social history, past surgical history and problem list.  Review of Systems Pertinent items are noted in HPI.   Objective:    BP 119/81  Pulse 92  Temp(Src) 98.7 F (37.1 C)  Ht 4\' 11"  (1.499 m)  Wt 226 lb (102.513 kg)  BMI 45.62 kg/m2  Breastfeeding? No       Abd: incision w/shallow separation/granulation tissue; silver nitrate applied Assessment:   Wound separation with continued improvement  Plan:    Follow up in: 2 weeks or as needed.

## 2013-11-18 ENCOUNTER — Encounter: Payer: Medicaid Other | Admitting: Physician Assistant

## 2013-11-25 ENCOUNTER — Encounter: Payer: Self-pay | Admitting: Obstetrics & Gynecology

## 2013-11-26 ENCOUNTER — Encounter: Payer: Self-pay | Admitting: Obstetrics & Gynecology

## 2013-11-26 ENCOUNTER — Ambulatory Visit (INDEPENDENT_AMBULATORY_CARE_PROVIDER_SITE_OTHER): Payer: Medicaid Other | Admitting: Obstetrics & Gynecology

## 2013-11-26 ENCOUNTER — Ambulatory Visit: Payer: Medicaid Other | Admitting: Obstetrics & Gynecology

## 2013-11-26 DIAGNOSIS — O900 Disruption of cesarean delivery wound: Secondary | ICD-10-CM

## 2013-11-26 NOTE — Progress Notes (Signed)
Subjective:     Suzanne [name] is a 28 y.o. female who presents for a postpartum visit. She is 6 weeks postpartum following a low cervical transverse Cesarean section. I have fully reviewed the prenatal and intrapartum course. The delivery was at 40 gestational weeks. Outcome: primary cesarean section, low transverse incision. Anesthesia: spinal. Postpartum course has been complicated by a wound seperation. Baby's course has been WNL. Baby is feeding by bottle - Gerber Gentle. Bleeding no bleeding. Bowel function is normal. Bladder function is normal. Patient is not sexually active. Contraception method is abstinence. Postpartum depression screening: negative.  The following portions of the patient's history were reviewed and updated as appropriate: allergies, current medications, past family history, past medical history, past social history, past surgical history and problem list.  Review of Systems Pertinent items are noted in HPI.   Objective:    BP 125/87  Pulse 86  Temp(Src) 98.2 F (36.8 C)  Ht 4\' 11"  (1.499 m)  Wt 226 lb (102.513 kg)  BMI 45.62 kg/m2  Breastfeeding? No       General:  alert     Abdomen: soft, non-tender; bowel sounds normal; no masses,  no organomegaly; incision w/exophytic granulation tissue.  The area was prepped with betadine.  The underlying skin was infiltrated w/1% lidocaine.  The tissue was excised w/a scalpel.  Silver nitrate was applied.  A dressing was applied   Vulva:  normal  Vagina: normal vagina  Cervix:  no lesions  Corpus: normal size, contour, position, consistency, mobility, non-tender  Adnexa:  normal adnexa    Assessment:   Wound w/granulation tissue  Plan:   Follow up in: 2 weeks or as needed.

## 2013-12-01 ENCOUNTER — Ambulatory Visit (INDEPENDENT_AMBULATORY_CARE_PROVIDER_SITE_OTHER): Payer: Medicaid Other | Admitting: Physician Assistant

## 2013-12-01 ENCOUNTER — Encounter: Payer: Self-pay | Admitting: Physician Assistant

## 2013-12-01 VITALS — BP 120/90 | HR 80 | Ht 59.0 in | Wt 228.0 lb

## 2013-12-01 DIAGNOSIS — R0600 Dyspnea, unspecified: Secondary | ICD-10-CM

## 2013-12-01 DIAGNOSIS — R0609 Other forms of dyspnea: Secondary | ICD-10-CM

## 2013-12-01 DIAGNOSIS — I5030 Unspecified diastolic (congestive) heart failure: Secondary | ICD-10-CM

## 2013-12-01 DIAGNOSIS — R0989 Other specified symptoms and signs involving the circulatory and respiratory systems: Secondary | ICD-10-CM

## 2013-12-01 DIAGNOSIS — I509 Heart failure, unspecified: Secondary | ICD-10-CM

## 2013-12-01 NOTE — Patient Instructions (Addendum)
Your physician recommends that you schedule a follow-up appointment  As Needed with Dr. Cassell Clement.   NO CHANGES WERE MADE TODAY

## 2013-12-01 NOTE — Progress Notes (Signed)
[address] [place], [place]  [address] Phone: [phone] Fax:  [phone]  Date:  12/01/2013   ID:  Suzanne AlconGenesis L Poythress, DOB 07/28/1986, MRN [id number]  PCP:  No primary provider on file.  Cardiologist:  Dr. Cassell Clementhomas Brackbill     History of Present Illness: Suzanne [name] is a 28 y.o. female with a hx of sickle cell trait.  She underwent c-section in 09/2013.  She was initially seen back in the hospital with symptomatic anemia and transfused with PRBCs x 2.  She returned to the hospital 1/21-1/24 with evidence of volume excess.  She was diuresed.  Echo demonstrated normal LVF.  She was placed on hydralazine for afterload reduction. Volume overload was felt to be secondary to IV fluids at the time of C-section and recent transfusion with PRBCs. Chest CT was negative for pulmonary embolism.  Since discharge, she is doing well. She stopped Lasix and hydralazine. She tells me that Lasix caused angioedema. She tells me that hydralazine caused itching. She denies any further dyspnea or swelling. She has occasional chest pain. This has been a chronic symptom for many years. She relates it to acid reflux. She has relief with TUMS. She has no associated symptoms. She denies orthopnea, PND. She denies syncope.  Echo (10/16/11): EF 50-55%, no RWMA, normal diastolic function.  Recent Labs: 10/15/2013: ALT 26; Pro B Natriuretic peptide (BNP) 1537.0*; TSH 1.950  10/18/2013: Creatinine 0.67; Hemoglobin 10.9*; Potassium 4.5   Wt Readings from Last 3 Encounters:  12/01/13 228 lb (103.42 kg)  11/26/13 226 lb (102.513 kg)  11/12/13 226 lb (102.513 kg)     Past Medical History  Diagnosis Date  . Sickle cell trait   . Acid reflux     Current Outpatient Prescriptions  Medication Sig Dispense Refill  . Iron-FA-B Cmp-C-Biot-Probiotic (FUSION PLUS) CAPS Take 1 capsule by mouth daily before breakfast.  30 capsule  5  . Prenatal Vit-Fe Fumarate-FA (PRENATAL MULTIVITAMIN) TABS tablet Take 1 tablet by  mouth daily at 12 noon.       No current facility-administered medications for this visit.    Allergies:   Lasix   Social History:  The patient  reports that she has never smoked. She has never used smokeless tobacco. She reports that she does not drink alcohol or use illicit drugs.   Family History:  The patient's family history includes Arthritis in her mother; Diabetes in her maternal grandmother and mother; Hypertension in her maternal grandmother and mother. There is no history of Cancer or Heart disease.   ROS:  Please see the history of present illness.      All other systems reviewed and negative.   PHYSICAL EXAM: VS:  BP 120/90  Pulse 80  Ht 4\' 11"  (1.499 m)  Wt 228 lb (103.42 kg)  BMI 46.03 kg/m2  Breastfeeding? No Well nourished, well developed, in no acute distress HEENT: normal Neck: no JVD Cardiac:  normal S1, S2; RRR; no murmur Lungs:  clear to auscultation bilaterally, no wheezing, rhonchi or rales Abd: soft, nontender, no hepatomegaly Ext: no edema Skin: warm and dry Neuro:  CNs 2-12 intact, no focal abnormalities noted  EKG:  NSR, HR 80, normal axis, nonspecific ST-T wave changes     ASSESSMENT AND PLAN:  1. Heart Failure with Preserved Ejection Fraction (HFpEF): She had evidence of volume excess in the setting of normal LV function secondary to IV fluids given during C-section as well as anemia and fluid overload from transfusion  of PRBCs. She is now off of diuretics. Volume status is normal.  She requires no further cardiac workup. 2. Chest Pain: This is related to acid reflux. She may take over-the-counter antacids and followup with primary care. 3. Disposition: Follow up with Dr. Patty Sermons as needed.  Signed, Tereso Newcomer, PA-C  12/01/2013 10:36 AM

## 2013-12-10 ENCOUNTER — Encounter: Payer: Self-pay | Admitting: Obstetrics

## 2013-12-10 ENCOUNTER — Ambulatory Visit (INDEPENDENT_AMBULATORY_CARE_PROVIDER_SITE_OTHER): Payer: Medicaid Other | Admitting: Obstetrics

## 2013-12-10 DIAGNOSIS — Z3009 Encounter for other general counseling and advice on contraception: Secondary | ICD-10-CM

## 2013-12-10 NOTE — Progress Notes (Signed)
Subjective:     Suzanne [name] is a 28 y.o. female who presents for a postpartum visit. She is 8 weeks postpartum following a low cervical transverse Cesarean section. I have fully reviewed the prenatal and intrapartum course. The delivery was at 40 gestational weeks. Outcome: primary cesarean section, low transverse incision. Anesthesia: epidural. Postpartum course has been WNL. Patient states her chest has been hurting a lot. Baby's course has been WNL. Baby is feeding by bottle - Gerber Goodstart Gentle . Bleeding no bleeding. Bowel function is normal. Bladder function is normal. Patient is not sexually active. Contraception method is abstinence. Postpartum depression screening: negative.  The following portions of the patient's history were reviewed and updated as appropriate: allergies, current medications, past family history, past medical history, past social history, past surgical history and problem list.  Review of Systems Pertinent items are noted in HPI.   Objective:    BP 132/91  Pulse 90  Temp(Src) 98.3 F (36.8 C)  Ht 4\' 11"  (1.499 m)  Wt 228 lb (103.42 kg)  BMI 46.03 kg/m2  Breastfeeding? No  General:  alert and no distress PE:          Breasts:  Negative        Abdomen:  Soft, NT.        Uterus NSSC, NT.  Adnexa negative.   Assessment:     Normal postpartum exam. Pap smear not done at today's visit.   Plan:    1. Contraception: abstinence 2. Contraceptive options discussed. 3. Follow up in: 6 weeks or as needed.

## 2013-12-11 ENCOUNTER — Encounter: Payer: Self-pay | Admitting: Obstetrics

## 2013-12-15 ENCOUNTER — Encounter: Payer: Self-pay | Admitting: *Deleted

## 2013-12-18 ENCOUNTER — Encounter: Payer: Self-pay | Admitting: *Deleted

## 2013-12-18 ENCOUNTER — Encounter: Payer: Self-pay | Admitting: Obstetrics & Gynecology

## 2014-01-21 ENCOUNTER — Ambulatory Visit: Payer: Medicaid Other | Admitting: Obstetrics

## 2014-01-24 ENCOUNTER — Emergency Department (HOSPITAL_COMMUNITY)
Admission: EM | Admit: 2014-01-24 | Discharge: 2014-01-24 | Disposition: A | Discharge status: Home / Self Care | Payer: Medicaid Other | Attending: Emergency Medicine | Admitting: Emergency Medicine

## 2014-01-24 ENCOUNTER — Encounter (HOSPITAL_COMMUNITY): Payer: Self-pay | Admitting: Emergency Medicine

## 2014-01-24 DIAGNOSIS — Z862 Personal history of diseases of the blood and blood-forming organs and certain disorders involving the immune mechanism: Secondary | ICD-10-CM | POA: Insufficient documentation

## 2014-01-24 DIAGNOSIS — Z8719 Personal history of other diseases of the digestive system: Secondary | ICD-10-CM | POA: Insufficient documentation

## 2014-01-24 DIAGNOSIS — J020 Streptococcal pharyngitis: Secondary | ICD-10-CM

## 2014-01-24 MED ORDER — AMOXICILLIN 500 MG PO CAPS: 500.0000 mg | ORAL_CAPSULE | Freq: Three times a day (TID) | ORAL | Status: DC

## 2014-01-24 NOTE — Discharge Instructions (Signed)
Take amoxicillin as directed until gone. Refer to attached documents for more information.  °

## 2014-01-24 NOTE — ED Provider Notes (Signed)
Medical screening examination/treatment/procedure(s) were performed by non-physician practitioner and as supervising physician I was immediately available for consultation/collaboration.   EKG Interpretation None       Olivia Mackie, MD 01/24/14 [phone]

## 2014-01-24 NOTE — ED Provider Notes (Signed)
CSN: [id number]     Arrival date & time 01/24/14  0345 History   First MD Initiated Contact with Patient 01/24/14 [phone]     Chief Complaint  Patient presents with  . Sore Throat     (Consider location/radiation/quality/duration/timing/severity/associated sxs/prior Treatment) HPI Comments: Patient is a 28 year old female who presents with a 2 day history of sore throat. Patient reports gradual onset and progressively worsening sharp, severe throat pain. The pain is constant and made worse with swallowing. The pain is localized to the patient's throat and equal on both sides. Nothing alleviates the pain. The patient has not tried anything for symptom relief. Patient reports associated subjective fever, cervical adenopathy, and non productive cough. Patient denies headache, visual changes, sinus congestion, difficulty breathing, chest pain, SOB, abdominal pain, NVD.     Patient is a 28 y.o. female presenting with pharyngitis.  Sore Throat Associated symptoms include congestion and a sore throat. Pertinent negatives include no abdominal pain, arthralgias, chest pain, chills, fatigue, fever, nausea, neck pain, vomiting or weakness.    Past Medical History  Diagnosis Date  . Sickle cell trait   . Acid reflux    Past Surgical History  Procedure Laterality Date  . Cesarean section N/A 10/08/2013    Procedure: CESAREAN SECTION;  Surgeon: Antionette Char, MD;  Location: [facility];  Service: Obstetrics;  Laterality: N/A;   Family History  Problem Relation Age of Onset  . Diabetes Mother   . Arthritis Mother   . Hypertension Mother   . Diabetes Maternal Grandmother   . Hypertension Maternal Grandmother   . Cancer Neg Hx   . Heart disease Neg Hx    History  Substance Use Topics  . Smoking status: Never Smoker   . Smokeless tobacco: Never Used  . Alcohol Use: No   OB History   Grav Para Term Preterm Abortions TAB SAB Ect Mult Living   1 1 1       1      Review of Systems   Constitutional: Negative for fever, chills and fatigue.  HENT: Positive for congestion, ear pain and sore throat. Negative for trouble swallowing.   Eyes: Negative for visual disturbance.  Respiratory: Negative for shortness of breath.   Cardiovascular: Negative for chest pain and palpitations.  Gastrointestinal: Negative for nausea, vomiting, abdominal pain and diarrhea.  Genitourinary: Negative for dysuria and difficulty urinating.  Musculoskeletal: Negative for arthralgias and neck pain.  Skin: Negative for color change.  Neurological: Negative for dizziness and weakness.  Psychiatric/Behavioral: Negative for dysphoric mood.      Allergies  Hydralazine and Lasix  Home Medications   Prior to Admission medications   Medication Sig Start Date End Date Taking? Authorizing Provider  ibuprofen (ADVIL,MOTRIN) 200 MG tablet Take 200 mg by mouth every 6 (six) hours as needed.   Yes Historical Provider, MD  naproxen sodium (ANAPROX) 220 MG tablet Take 220 mg by mouth 2 (two) times daily as needed (for pain.).   Yes Historical Provider, MD   BP 139/82  Pulse 94  Temp(Src) 98 F (36.7 C) (Oral)  Resp 15  Ht 4\' 11"  (1.499 m)  Wt 220 lb (99.791 kg)  BMI 44.41 kg/m2  SpO2 99%  LMP 01/03/2014  Breastfeeding? No Physical Exam  Nursing note and vitals reviewed. Constitutional: She is oriented to person, place, and time. She appears well-developed and well-nourished. No distress.  HENT:  Head: Normocephalic and atraumatic.  Bilateral tonsillar erythema, edema and exudate.   Eyes: Conjunctivae and  EOM are normal.  Neck: Normal range of motion.  Cardiovascular: Normal rate and regular rhythm.  Exam reveals no gallop and no friction rub.   No murmur heard. Pulmonary/Chest: Effort normal and breath sounds normal. She has no wheezes. She has no rales. She exhibits no tenderness.  Musculoskeletal: Normal range of motion.  Lymphadenopathy:    She has cervical adenopathy.  Neurological:  She is alert and oriented to person, place, and time. Coordination normal.  Speech is goal-oriented. Moves limbs without ataxia.   Skin: Skin is warm and dry.  Psychiatric: She has a normal mood and affect. Her behavior is normal.    ED Course  Procedures (including critical care time) Labs Review Labs Reviewed - No data to display  Imaging Review No results found.   EKG Interpretation None      MDM   Final diagnoses:  Strep throat    5:08 AM Patient will be treated for strep throat based on clinical appearance. Vitals stable and patient afebrile. Patient will have amoxicillin.    Emilia BeckKaitlyn Declynn Lopresti, [place]-C 01/24/14 [phone]

## 2014-01-24 NOTE — ED Notes (Signed)
Pt c/o sore throat, cough, nasal congestion, ear pain, onset yesterday.

## 2014-03-09 ENCOUNTER — Other Ambulatory Visit: Payer: Self-pay | Admitting: Obstetrics

## 2014-07-27 ENCOUNTER — Encounter (HOSPITAL_COMMUNITY): Payer: Self-pay | Admitting: Emergency Medicine

## 2014-08-26 ENCOUNTER — Encounter (HOSPITAL_COMMUNITY): Payer: Self-pay | Admitting: Emergency Medicine

## 2014-08-26 ENCOUNTER — Emergency Department (HOSPITAL_COMMUNITY)
Admission: EM | Admit: 2014-08-26 | Discharge: 2014-08-26 | Disposition: A | Discharge status: Home / Self Care | Payer: Medicaid Other | Attending: Emergency Medicine | Admitting: Emergency Medicine

## 2014-08-26 DIAGNOSIS — T7840XA Allergy, unspecified, initial encounter: Secondary | ICD-10-CM

## 2014-08-26 DIAGNOSIS — L509 Urticaria, unspecified: Secondary | ICD-10-CM

## 2014-08-26 DIAGNOSIS — T7849XA Other allergy, initial encounter: Secondary | ICD-10-CM | POA: Insufficient documentation

## 2014-08-26 DIAGNOSIS — Y998 Other external cause status: Secondary | ICD-10-CM | POA: Insufficient documentation

## 2014-08-26 DIAGNOSIS — Y9389 Activity, other specified: Secondary | ICD-10-CM | POA: Insufficient documentation

## 2014-08-26 DIAGNOSIS — Z862 Personal history of diseases of the blood and blood-forming organs and certain disorders involving the immune mechanism: Secondary | ICD-10-CM | POA: Insufficient documentation

## 2014-08-26 DIAGNOSIS — Z8719 Personal history of other diseases of the digestive system: Secondary | ICD-10-CM | POA: Insufficient documentation

## 2014-08-26 DIAGNOSIS — X58XXXA Exposure to other specified factors, initial encounter: Secondary | ICD-10-CM | POA: Insufficient documentation

## 2014-08-26 DIAGNOSIS — Y9289 Other specified places as the place of occurrence of the external cause: Secondary | ICD-10-CM | POA: Insufficient documentation

## 2014-08-26 DIAGNOSIS — L500 Allergic urticaria: Secondary | ICD-10-CM | POA: Insufficient documentation

## 2014-08-26 MED ORDER — PREDNISONE 20 MG PO TABS: 40.0000 mg | ORAL_TABLET | Freq: Every day | ORAL | Status: DC

## 2014-08-26 MED ORDER — PREDNISONE 20 MG PO TABS
60.0000 mg | ORAL_TABLET | Freq: Once | ORAL | Status: AC
  Administered 2014-08-26: 60 mg via ORAL
  Filled 2014-08-26: qty 3

## 2014-08-26 MED ORDER — FAMOTIDINE 20 MG PO TABS
40.0000 mg | ORAL_TABLET | Freq: Once | ORAL | Status: AC
  Administered 2014-08-26: 40 mg via ORAL
  Filled 2014-08-26: qty 2

## 2014-08-26 NOTE — ED Provider Notes (Signed)
CSN: [id number]     Arrival date & time 08/26/14  1045 History   First MD Initiated Contact with Patient 08/26/14 1104     Chief Complaint  Patient presents with  . Urticaria     HPI Pt was seen at 1115. Per pt, c/o gradual onset and persistence of waxing and waning "hives" for the past 3 days. States she has recently been using a new "body spray with glitter" and recently "tried onions for the first time," and questions if either may be the cause of her hives. Has been taking OTC benadryl with partial, transient relief. Denies dysphagia, no stridor, no hoarse voice, no wheezing/SOB.    Past Medical History  Diagnosis Date  . Sickle cell trait   . Acid reflux    Past Surgical History  Procedure Laterality Date  . Cesarean section N/A 10/08/2013    Procedure: CESAREAN SECTION;  Surgeon: Antionette Char, MD;  Location: [facility];  Service: Obstetrics;  Laterality: N/A;   Family History  Problem Relation Age of Onset  . Diabetes Mother   . Arthritis Mother   . Hypertension Mother   . Diabetes Maternal Grandmother   . Hypertension Maternal Grandmother   . Cancer Neg Hx   . Heart disease Neg Hx    History  Substance Use Topics  . Smoking status: Never Smoker   . Smokeless tobacco: Never Used  . Alcohol Use: No   OB History    Gravida Para Term Preterm AB TAB SAB Ectopic Multiple Living   1 1 1       1      Review of Systems ROS: Statement: All systems negative except as marked or noted in the HPI; Constitutional: Negative for fever and chills. ; ; Eyes: Negative for eye pain, redness and discharge. ; ; ENMT: Negative for ear pain, hoarseness, nasal congestion, sinus pressure and sore throat. ; ; Cardiovascular: Negative for chest pain, palpitations, diaphoresis, dyspnea and peripheral edema. ; ; Respiratory: Negative for cough, wheezing and stridor. ; ; Gastrointestinal: Negative for nausea, vomiting, diarrhea, abdominal pain, blood in stool, hematemesis, jaundice and rectal  bleeding. . ; ; Genitourinary: Negative for dysuria, flank pain and hematuria. ; ; Musculoskeletal: Negative for back pain and neck pain. Negative for swelling and trauma.; ; Skin: +hives. Negative for abrasions, blisters, bruising and skin lesion.; ; Neuro: Negative for headache, lightheadedness and neck stiffness. Negative for weakness, altered level of consciousness , altered mental status, extremity weakness, paresthesias, involuntary movement, seizure and syncope.     Allergies  Hydralazine and Lasix  Home Medications   Prior to Admission medications   Medication Sig Start Date End Date Taking? Authorizing Provider  diphenhydrAMINE (BENADRYL) 25 MG tablet Take 25 mg by mouth at bedtime as needed for itching or allergies.   Yes Historical Provider, MD  amoxicillin (AMOXIL) 500 MG capsule Take 1 capsule (500 mg total) by mouth 3 (three) times daily. Patient not taking: Reported on 08/26/2014 01/24/14   Emilia Beck, PA-C   BP 154/93 mmHg  Pulse 95  Temp(Src) 98.2 F (36.8 C) (Oral)  Resp 16  SpO2 99% Physical Exam  1120: Physical examination:  Nursing notes reviewed; Vital signs and O2 SAT reviewed;  Constitutional: Well developed, Well nourished, Well hydrated, In no acute distress; Head:  Normocephalic, atraumatic; Eyes: EOMI, PERRL, No scleral icterus; ENMT: Mouth and pharynx normal, Mucous membranes moist. Mouth and pharynx without lesions. No tonsillar exudates. No intra-oral edema. No submandibular or sublingual edema. No hoarse voice,  no drooling, no stridor. No pain with manipulation of larynx. No trismus.;; Neck: Supple, Full range of motion, No lymphadenopathy; Cardiovascular: Regular rate and rhythm, No murmur, rub, or gallop; Respiratory: Breath sounds clear & equal bilaterally, No rales, rhonchi, wheezes.  Speaking full sentences with ease, Normal respiratory effort/excursion; Chest: Nontender, Movement normal; Abdomen: Soft, Nontender, Nondistended, Normal bowel sounds;  Genitourinary: No CVA tenderness; Extremities: Pulses normal, No tenderness, No edema, No calf edema or asymmetry.; Neuro: AA&Ox3, Major CN grossly intact.  Speech clear. No gross focal motor or sensory deficits in extremities. Climbs on and off stretcher easily by herself. Gait steady. ; Skin: Color normal, Warm, Dry. +scattered hives.    ED Course  Procedures     MDM  MDM Reviewed: previous chart, nursing note and vitals     1210:  Tx hives symptomatically at this time. Dx d/w pt.  Questions answered.  Verb understanding, agreeable to d/c home with outpt f/u.    Samuel JesterKathleen Jadarius Commons, DO 08/28/14 [phone]

## 2014-08-26 NOTE — ED Notes (Signed)
Discharge instructions reviewed with patient--agrees and verbalized understanding Patient informed of need to make and keep follow up appointment--agrees and verbalized understanding DC Rx x 1 reviewed with patient--agrees and verbalized understanding VS updated and stable--reviewed with patient at time of DC--agrees and verbalized understanding Patient alert and oriented x 4 and in NAD at time of discharge All questions related to this ED visit, DC instructions, DC prescriptions and follow up care answered to patient's satisfaction by this nurse 

## 2014-08-26 NOTE — ED Notes (Signed)
Patient with c/o rash and itching to bilateral upper arms, bilateral upper legs and chest area Red areas noted to both extremities and chest Patient states that redness to face is normal Patient denies feeling like her throat is closing, SOB Patient reports that she recently began using "a [employer] spray with glitter."

## 2014-08-26 NOTE — Discharge Instructions (Signed)
°Emergency Department Resource Guide °1) Find a Doctor and Pay Out of Pocket °Although you won't have to find out who is covered by your insurance plan, it is a good idea to ask around and get recommendations. You will then need to call the office and see if the doctor you have chosen will accept you as a new patient and what types of options they offer for patients who are self-pay. Some doctors offer discounts or will set up payment plans for their patients who do not have insurance, but you will need to ask so you aren't surprised when you get to your appointment. ° °2) Contact Your Local Health Department °Not all health departments have doctors that can see patients for sick visits, but many do, so it is worth a call to see if yours does. If you don't know where your local health department is, you can check in your phone book. The CDC also has a tool to help you locate your state's health department, and many state websites also have listings of all of their local health departments. ° °3) Find a Walk-in Clinic °If your illness is not likely to be very severe or complicated, you may want to try a walk in clinic. These are popping up all over the country in pharmacies, drugstores, and shopping centers. They're usually staffed by nurse practitioners or physician assistants that have been trained to treat common illnesses and complaints. They're usually fairly quick and inexpensive. However, if you have serious medical issues or chronic medical problems, these are probably not your best option. ° °No Primary Care Doctor: °- Call [facility] at  832-8000 - they can help you locate a primary care doctor that  accepts your insurance, provides certain services, etc. °- Physician Referral Service- 1-800-533-3463 ° °Chronic Pain Problems: °Organization         Address  Phone   Notes  °[facility]  (336) 297-2271 Patients need to be referred by their primary care doctor.  ° °Medication  Assistance: °Organization         Address  Phone   Notes  °[place] [facility] [address] °[place], [place] [address] (336) 641-8030 --Must be a resident of [place] °-- Must have NO insurance coverage whatsoever (no Medicaid/ Medicare, etc.) °-- The pt. MUST have a primary care doctor that directs their care regularly and follows them in the community °  °[facility]  (866) 331-1348   °[employer]  (888) 892-1162   ° °Agencies that provide inexpensive medical care: °Organization         Address  Phone   Notes  °[facility]  (336) 832-8035   °[facility]    (336) 832-7272   °[facility] [address] °[place], [place] [address] (336) 832-4777   °[facility] [address], °[place] (336) 271-4999   °[facility]    (336) 373-0678   °[facility]    (336) 272-1050   °[facility] ° [address], [place] Phone:  (336) 832-4444, Fax:  (336) 832-4440 Hours of Operation:  9 am - 6 pm, M-F.  Also accepts Medicaid/Medicare and self-pay.  °[facility] ° [address], [place] Phone: (336) [phone], Fax: (336) [phone]. Hours of Operation:  8:30 am - 5:30 pm, M-F.  Also accepts Medicaid and self-pay.  °[facility] [place] [address], [place] Phone: (336) 878-6027   °[facility] [address], [place], [place] (336)723-1848, Ext. 123 Mondays & Thursdays: 7-9 AM.  First 15 patients are seen on a first come, first serve basis. °  ° °Medicaid-accepting [place] Providers: ° °Organization         Address  Phone   Notes  °[facility] [address], [place] (336) 641-2100 Also accepts self-pay patients.  °[facility] [address], [place] ° (336) 856-9996   °[facility] [address], [place]  (336) 288-8857   °[facility] [address] [place] [address], [place] (336) 299-7000   °Veita Bland [address], [place]  ° (336) 373-1557 Only accepts [place] Access Medicaid patients after they have their name applied to their card.  ° °Self-Pay (no insurance) in [place]: ° °Organization         Address  Phone   Notes  °Sickle Cell Patients, [facility] [address], [place] (336) 832-1970   °[facility] [address], [place] (336) 832-4400   °[facility] ° [address], [place] (336) 992-4800   °[facility]/Dr. Osei-Bonsu ° [address] [place] [address], [place] or [address], [place] (336) [phone] Phone number for both [place] and [place] locations is the same.  °[facility] [address], [place] (336) 299-0000   °[facility] [address] [place] [address], [place] or [address] (336) 852-7530 °(336) 878-2260   °[facility] [address], [place] (336) 350-1642, phone; (336) 294-5005, fax Sees patients 1st and 3rd Saturday of every month.  Must not qualify for public or private insurance (i.e. Medicaid, Medicare, [place] Health Choice, Veterans' Benefits) • Household income should be no more than 200% of the poverty level •The clinic cannot treat you if you are pregnant or think you are pregnant • Sexually transmitted diseases are not treated at the clinic.  ° ° °Dental Care: °Organization         Address  Phone  Notes  °[place] [facility] [address], [place] (336) 641-6152 Accepts children up to age 21 who are enrolled in Medicaid or [place] Health Choice; pregnant women with a Medicaid card; and children who have applied for Medicaid or [place] Health Choice, but were declined, whose parents can pay a reduced fee at time of service.  °[place]  [facility] [place]  [address], [place] (336) 641-7733 Accepts children up to age 21 who are enrolled in Medicaid or [place] Health Choice; pregnant women with a Medicaid card; and children who have applied for Medicaid or [place] Health Choice, but were declined, whose parents can pay a reduced fee at time of service.  °[facility] ° [address], [place] (336) 641-4533 Patients are seen by appointment only. Walk-ins are not accepted. [facility] will see patients 18 years of age and older. °Monday - Tuesday (8am-5pm) °Most Wednesdays (8:30-5pm) °$30 per visit, cash only  °[facility] ° [address], [place] (336) 641-4533 Patients are seen by appointment only. Walk-ins are not accepted. [facility] will see patients 18 years of age and older. °One   Wednesday Evening (Monthly: Volunteer Based).  $30 per visit, cash only  °[facility]  (919) [phone] for adults; Children under age 4, call Graduate Pediatric Dentistry at ([phone]) [phone]. Children aged 4-14, please call ([phone]) [phone] to request a pediatric application. ° Dental services are provided in all areas of dental care including fillings, crowns and bridges, complete and partial dentures, implants, gum treatment, root canals, and extractions. Preventive care is also provided. Treatment is provided to both adults and children. °Patients are selected via a lottery and there is often a waiting list. °  °[facility] [address], °[place] ° (336) 763-8833 [email or web address] °  °[facility] [address], [place], [place] (336)723-1848, Ext. 123 Second and Fourth Thursday of each month, opens at 6:30 AM; Clinic ends at 9 AM.  Patients are seen on a first-come first-served basis, and a limited number are seen during each clinic.  ° °[facility] ° [address], [place], [place] (336) 723-7904    Eligibility Requirements °You must have lived in [place], or [place] counties for at least the last three months. °  You cannot be eligible for state or federal sponsored healthcare insurance, including Veterans Administration, Medicaid, or Medicare. °  You generally cannot be eligible for healthcare insurance through your employer.  °  How to apply: °Eligibility screenings are held every Tuesday and Wednesday afternoon from 1:00 pm until 4:00 pm. You do not need an appointment for the interview!  °[facility] [address], [place], [place] 336-631-2330   °[facility]  336-342-8273   °[facility]  336-703-3100   °[facility]  336-570-6415   ° °Behavioral Health Resources in the Community: °Intensive Outpatient Programs °Organization         Address  Phone  Notes  °[facility] [address], [place], [place] 336-878-6098   °[facility] [address], [place], [place] 336-832-9800   °[facility] [address], [place], [place] ° 336-882-2125   °[facility] [address],  °[place], [place] 1-800-853-5163 or 336-641-4981   °Substance Abuse Resources °Organization         Address  Phone  Notes  °[facility]  336-882-2125   °[facility]  336-784-9470   °[facility]  336-285-9073   °[facility]  336-845-3988   °Residential & Outpatient Substance Abuse Program  1-800-659-3381   °Psychological Services °Organization         Address  Phone  Notes  °[facility]  336- 832-9600   °[facility]  336- 378-7881   °[facility] [address], [place] 1-800-853-5163 or 336-641-4981   ° °Mobile Crisis Teams °Organization         Address  Phone  Notes  °[facility], Mobile Crisis Care Unit  1-877-626-1772   °Assertive °[facility] ° [address]  [place], [place] 336-834-9664   °Sharon DeEsch [address] °[place] 336-554-5454   ° °Self-Help/Support Groups °Organization         Address  Phone             Notes  °[facility]  - variety of support groups  336- 373-1402 Call for more information  °Narcotics Anonymous (NA), [facility] [address], °[place]  2 meetings at this location  ° °  Residential Treatment Programs Organization         Address  Phone  Notes  [facility] [address],    [place]  1-610-960-4540   [facility]  [address], [place] [address], [place], [place] [phone]   [facility] [address] [place], [place] 086-578-4696 Admissions: 8am-3pm M-F  [facility] [address],    [place], [place] 295-284-1324   [facility] [address] [place], [place], [place] 401-027-2536   [facility] [address]  [place], [place] [phone]   [facility] Intensive Outpatient [address], [place], [place] 956-387-5643   [facility] ([facility] [address] [place]  [place], [place] 3-295-188-4166 or 845-174-4144   [facility] ([facility] [address], [place], [place] 323-557-3220 Accepts Medicaid  [facility] [address],  [place] 2-542-706-2376 Substance Abuse/Addiction Treatment   [facility] Organization         Address  Phone  Notes  [facility]  (651) 039-7030   Angie Fava, PhD [address] [place], [place]   209-778-2429 or 734-621-0042   [facility]   [address] [place], [place] 3251322890   [facility] [address], [place], [place] [phone] Insurance/Medicaid/sponsorship through [facility] and Families [address]                                    [place], [place] [phone] Therapy/tele-psych/case    [facility] [address], [place] [phone]    Dr. Lolly Mustache  [phone]   [facility]  [employer] [facility] Dept. 1) [address], [place] 2) [address] [place] 3)  [address], [place] [phone]  [phone]   [facility] Child Abuse Hotline 2545360035 or (781)094-1019 (After Hours)      Take the prescription as directed.  Take over the counter benadryl, as directed on packaging, as needed for itching.  If the benadryl is too sedating, take an over the counter non-sedating antihistamine such as claritin, allegra or zyrtec, as directed on packaging.  Call your regular medical doctor today to schedule a follow up appointment within the next 3 days.  Return to the Emergency Department immediately sooner if worsening.

## 2014-08-26 NOTE — ED Notes (Signed)
Pt states that she has been having itchy rash since Sunday.  Thinks she may be allergic to grilled onions from [employer] (has been eating them x 1 month).

## 2014-08-26 NOTE — ED Notes (Signed)
MD at bedside. 

## 2014-09-21 ENCOUNTER — Encounter: Payer: Self-pay | Admitting: *Deleted

## 2014-09-22 ENCOUNTER — Encounter: Payer: Self-pay | Admitting: Obstetrics & Gynecology

## 2014-10-15 IMAGING — CT CT ABD-PELV W/ CM
2 of 4 series · 17 of 46 positions shown, 19 images · IV contrast (OMNIPAQUE)
Comparison: None.

CLINICAL DATA: Lower abdominal pain. Increasing leukocytosis.
C-section on 10/08/2012.

EXAM:
CT ABDOMEN AND PELVIS WITH CONTRAST
TECHNIQUE: Multidetector CT imaging of the abdomen and pelvis was performed
using the standard protocol following bolus administration of
intravenous contrast.
CONTRAST:  100mL OMNIPAQUE IOHEXOL 300 MG/ML  SOLN

[Series 2: rtn a/p with · axial · 0.91mm/px · z∈[-374,+21]mm · 14 of 87 slices shown, 16 images]
[im 4/87  soft-tissue]
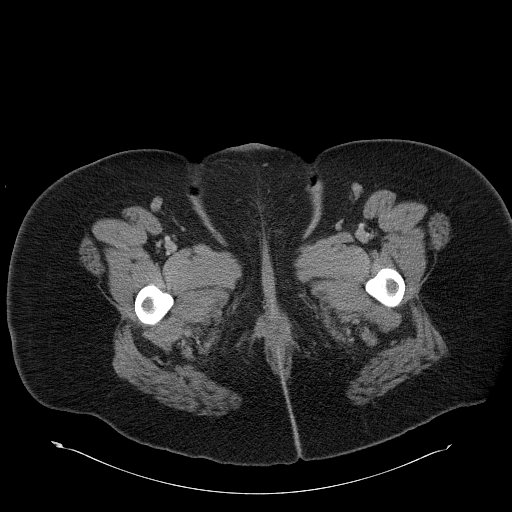
[im 4/87  bone]
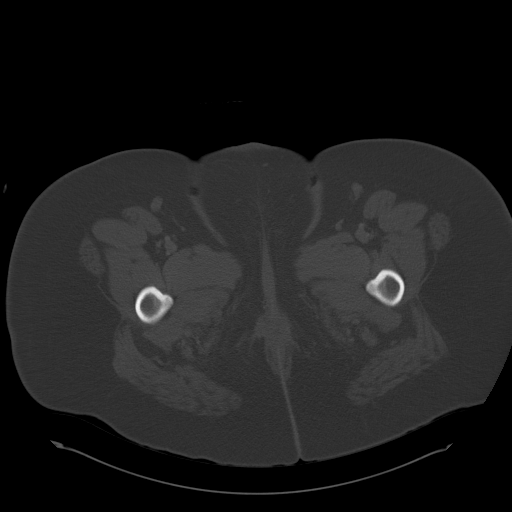
[im 12/87  soft-tissue]
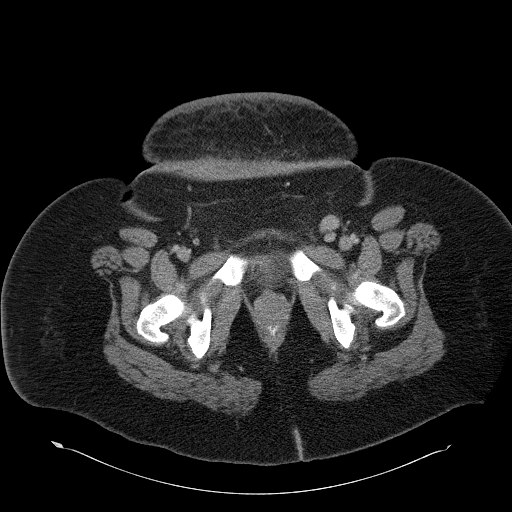
[im 15/87  soft-tissue]
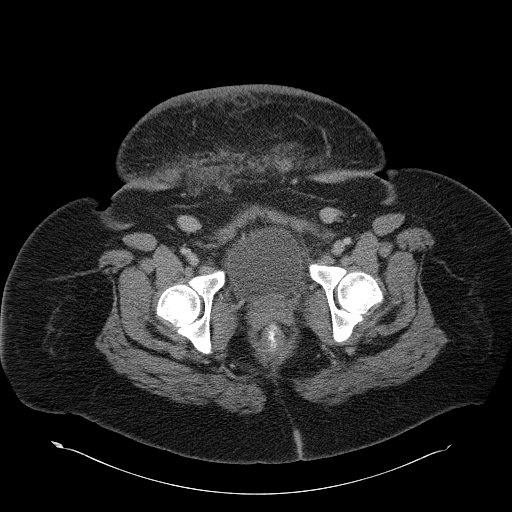
[im 23/87  soft-tissue]
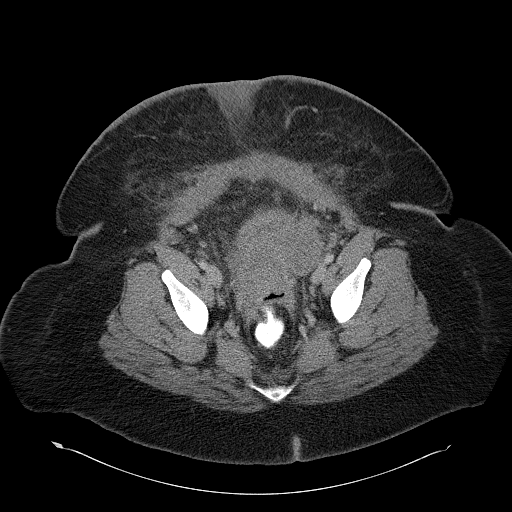
[im 30/87  soft-tissue]
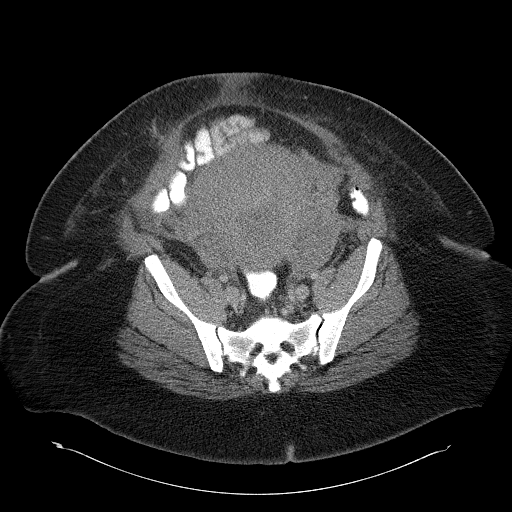
[im 34/87  soft-tissue]
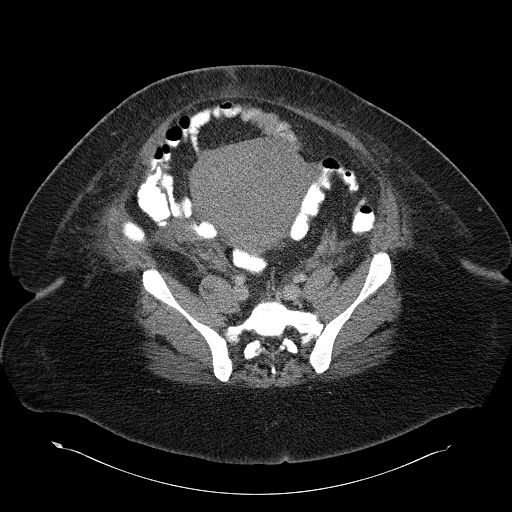
[im 42/87  soft-tissue]
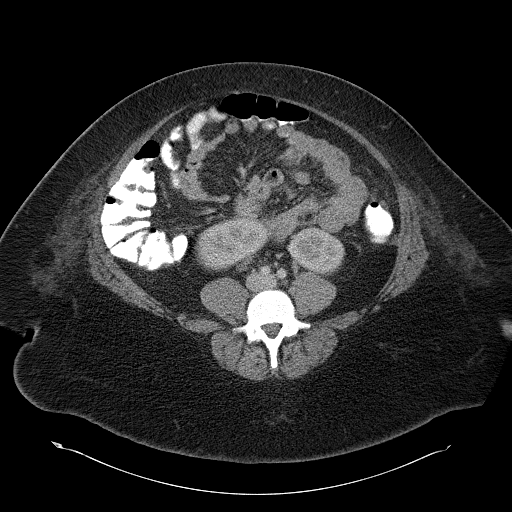
[im 45/87  soft-tissue]
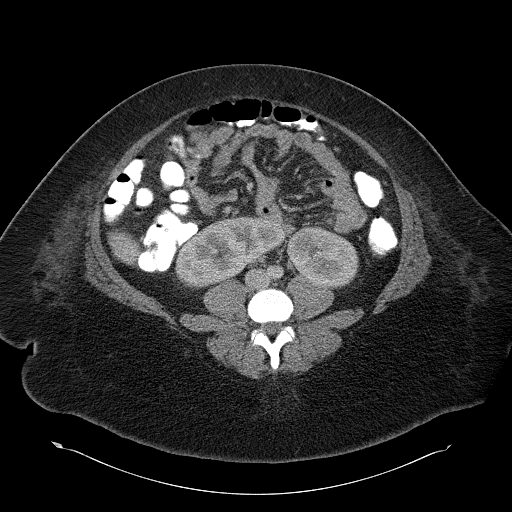
[im 53/87  soft-tissue]
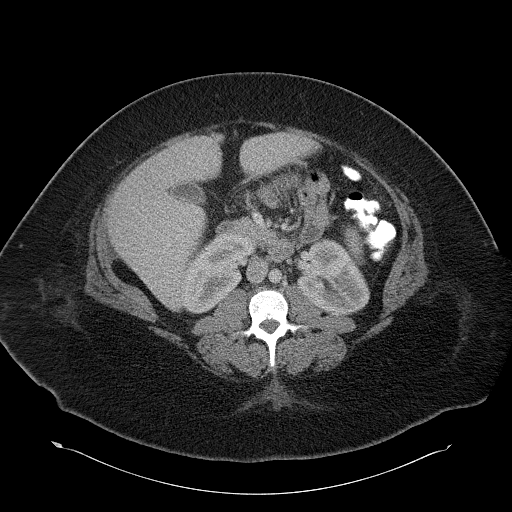
[im 53/87  bone]
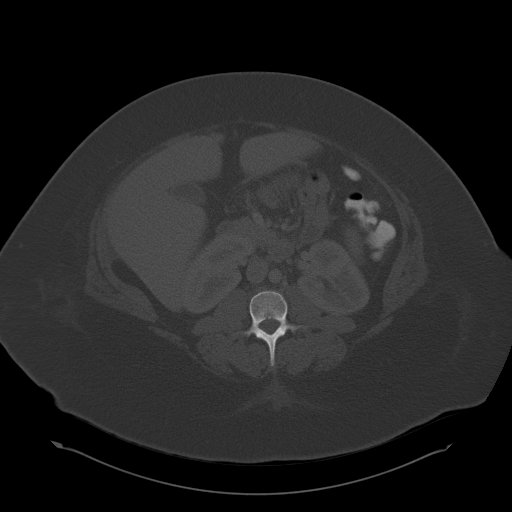
[im 57/87  soft-tissue]
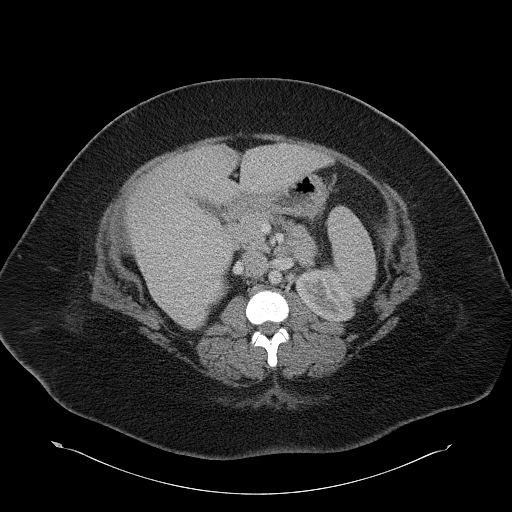
[im 64/87  soft-tissue]
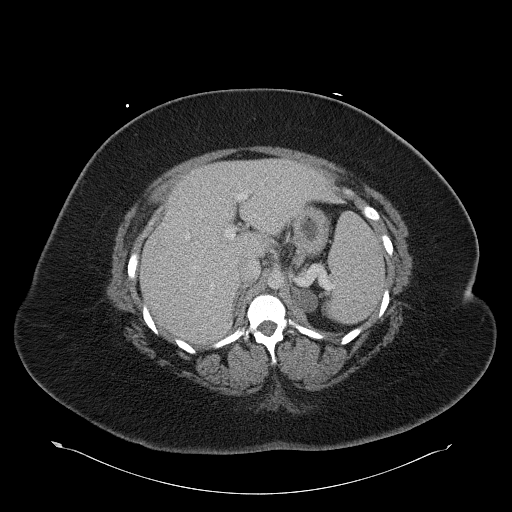
[im 72/87  soft-tissue]
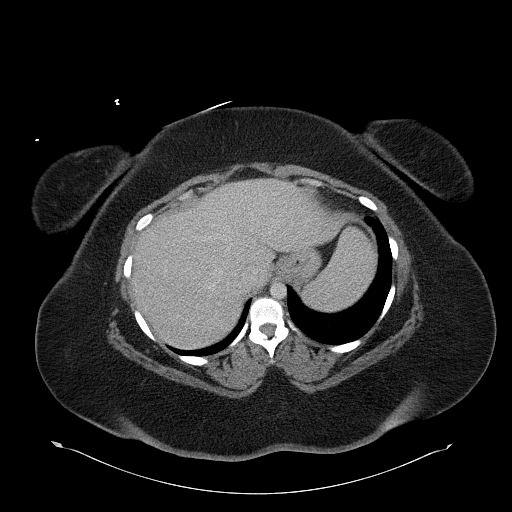
[im 75/87  soft-tissue]
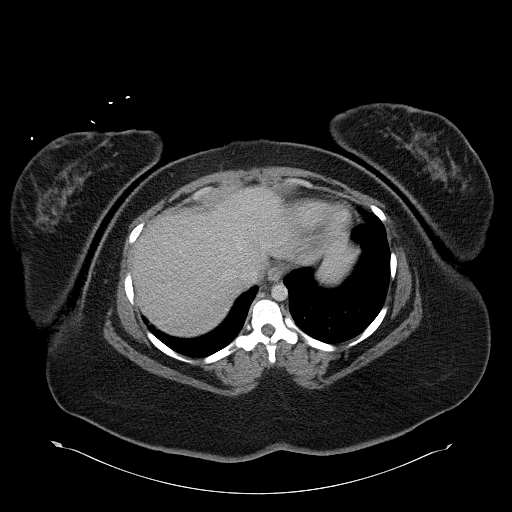
[im 83/87  soft-tissue]
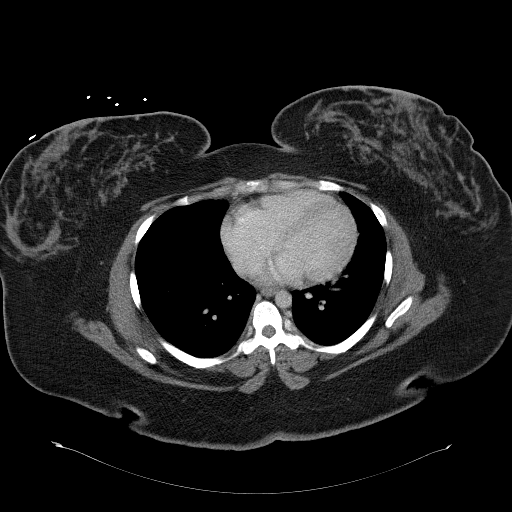

[Series 602: <mpr thick range> · coronal · 0.91mm/px · 3 of 121 slices shown]
[im 41/121  soft-tissue]
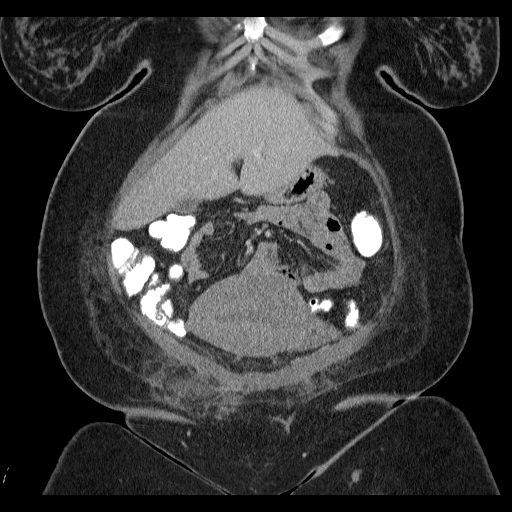
[im 54/121  soft-tissue]
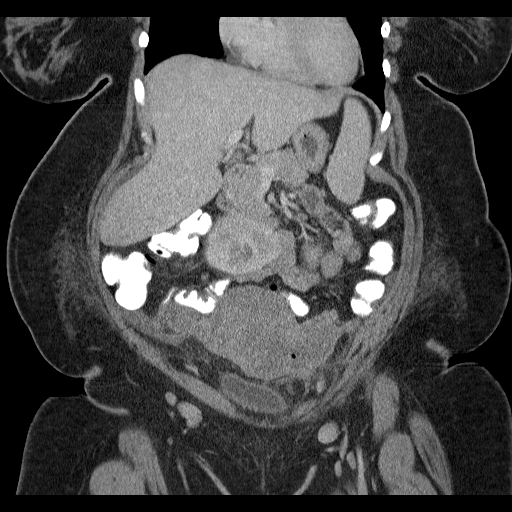
[im 67/121  soft-tissue]
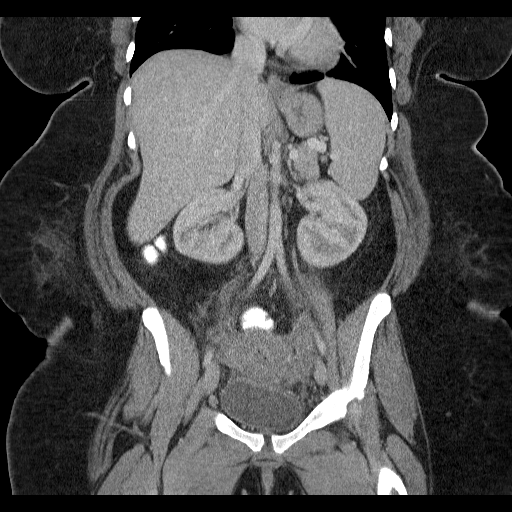

[17 of 46 positions shown; findings below may reference images not displayed]

FINDINGS: There is a small amount of air and fluid in the C-section defect in
the anterior inferior aspect of the uterus with an ill-defined area
of mixed density at the left side of the C-section site which may
represent a hematoma or abscess. It measures approximately 7.9 x
x 3.5 cm.

Ovaries appear normal. 2 cm fibroid on the anterior superior right
lateral aspect of the uterus. The patient has hepatomegaly of
unknown etiology. This pushes the right kidney inferiorly and
medially.

The spleen, pancreas, right adrenal gland, and kidneys are otherwise
normal. There is a 2.6 cm adenoma in the left adrenal gland.

Bowel is normal. There is the expected degree of soft tissue
stranding in the subcutaneous fat at the site of the a C-section
incision. No free air in the abdomen. No ascites.
IMPRESSION: 1. Air and inhomogeneous density in the C-section incision in the
lower uterine segment within inhomogeneous soft tissue density at
the left side of the incision which could represent a hematoma or
less likely an abscess.
2. Hepatomegaly of unknown etiology.

## 2014-12-17 ENCOUNTER — Encounter: Payer: Self-pay | Admitting: Primary Care

## 2014-12-17 ENCOUNTER — Ambulatory Visit (INDEPENDENT_AMBULATORY_CARE_PROVIDER_SITE_OTHER): Payer: 59 | Admitting: Primary Care

## 2014-12-17 VITALS — BP 136/106 | HR 90 | Temp 97.4°F | Ht 58.5 in | Wt 228.4 lb

## 2014-12-17 DIAGNOSIS — E669 Obesity, unspecified: Secondary | ICD-10-CM | POA: Insufficient documentation

## 2014-12-17 DIAGNOSIS — I10 Essential (primary) hypertension: Secondary | ICD-10-CM

## 2014-12-17 MED ORDER — HYDROCHLOROTHIAZIDE 25 MG PO TABS: 25.0000 mg | ORAL_TABLET | Freq: Every day | ORAL | Status: DC

## 2014-12-17 NOTE — Patient Instructions (Addendum)
Continue your efforts to work on a healthy diet. Try to incorporate some exercise daily at least for 30 minutes. Start Hydrochlorothiazide tablets once daily for your blood pressure. Please schedule a fasting physical with me in 2 week.  We will re-evaluate your weight loss efforts and blood pressure Welcome to [facility]!  Cardiac Diet This diet can help prevent heart disease and stroke. Many factors influence your heart health, including eating and exercise habits. Coronary risk rises a lot with abnormal blood fat (lipid) levels. Cardiac meal planning includes limiting unhealthy fats, increasing healthy fats, and making other small dietary changes. General guidelines are as follows:  Adjust calorie intake to reach and maintain desirable body weight.  Limit total fat intake to less than 30% of total calories. Saturated fat should be less than 7% of calories.  Saturated fats are found in animal products and in some vegetable products. Saturated vegetable fats are found in coconut oil, cocoa butter, palm oil, and palm kernel oil. Read labels carefully to avoid these products as much as possible. Use butter in moderation. Choose tub margarines and oils that have 2 grams of fat or less. Good cooking oils are canola and olive oils.  Practice low-fat cooking techniques. Do not fry food. Instead, broil, bake, boil, steam, grill, roast on a rack, stir-fry, or microwave it. Other fat reducing suggestions include:  Remove the skin from poultry.  Remove all visible fat from meats.  Skim the fat off stews, soups, and gravies before serving them.  Steam vegetables in water or broth instead of sauting them in fat.  Avoid foods with trans fat (or hydrogenated oils), such as commercially fried foods and commercially baked goods. Commercial shortening and deep-frying fats will contain trans fat.  Increase intake of fruits, vegetables, whole grains, and legumes to replace foods high in fat.  Increase  consumption of nuts, legumes, and seeds to at least 4 servings weekly. One serving of a legume equals  cup, and 1 serving of nuts or seeds equals  cup.  Choose whole grains more often. Have 3 servings per day (a serving is 1 ounce [oz]).  Eat 4 to 5 servings of vegetables per day. A serving of vegetables is 1 cup of raw leafy vegetables;  cup of raw or cooked cut-up vegetables;  cup of vegetable juice.  Eat 4 to 5 servings of fruit per day. A serving of fruit is 1 medium whole fruit;  cup of dried fruit;  cup of fresh, frozen, or canned fruit;  cup of 100% fruit juice.  Increase your intake of dietary fiber to 20 to 30 grams per day. Insoluble fiber may help lower your risk of heart disease and may help curb your appetite.  Soluble fiber binds cholesterol to be removed from the blood. Foods high in soluble fiber are dried beans, citrus fruits, oats, apples, bananas, broccoli, Brussels sprouts, and eggplant.  Try to include foods fortified with plant sterols or stanols, such as yogurt, breads, juices, or margarines. Choose several fortified foods to achieve a daily intake of 2 to 3 grams of plant sterols or stanols.  Foods with omega-3 fats can help reduce your risk of heart disease. Aim to have a 3.5 oz portion of fatty fish twice per week, such as salmon, mackerel, albacore tuna, sardines, lake trout, or herring. If you wish to take a fish oil supplement, choose one that contains 1 gram of both DHA and EPA.  Limit processed meats to 2 servings (3 oz portion) weekly.  Limit the sodium in your diet to 1500 milligrams (mg) per day. If you have high blood pressure, talk to a registered dietitian about a DASH (Dietary Approaches to Stop Hypertension) eating plan.  Limit sweets and beverages with added sugar, such as soda, to no more than 5 servings per week. One serving is:   1 tablespoon sugar.  1 tablespoon jelly or jam.   cup sorbet.  1 cup lemonade.   cup regular  soda. CHOOSING FOODS Starches  Allowed: Breads: All kinds (wheat, rye, raisin, white, oatmeal, [place], [place], and English muffin bread). Low-fat rolls: English muffins, frankfurter and hamburger buns, bagels, pita bread, tortillas (not fried). Pancakes, waffles, biscuits, and muffins made with recommended oil.  Avoid: Products made with saturated or trans fats, oils, or whole milk products. Butter rolls, cheese breads, croissants. Commercial doughnuts, muffins, sweet rolls, biscuits, waffles, pancakes, store-bought mixes. Crackers  Allowed: Low-fat crackers and snacks: Animal, graham, rye, saltine (with recommended oil, no lard), oyster, and matzo crackers. Bread sticks, melba toast, rusks, flatbread, pretzels, and light popcorn.  Avoid: High-fat crackers: cheese crackers, butter crackers, and those made with coconut, palm oil, or trans fat (hydrogenated oils). Buttered popcorn. Cereals  Allowed: Hot or cold whole-grain cereals.  Avoid: Cereals containing coconut, hydrogenated vegetable fat, or animal fat. Potatoes / Pasta / Rice  Allowed: All kinds of potatoes, rice, and pasta (such as macaroni, spaghetti, and noodles).  Avoid: Pasta or rice prepared with cream sauce or high-fat cheese. Chow mein noodles, [place] fries. Vegetables  Allowed: All vegetables and vegetable juices.  Avoid: Fried vegetables. Vegetables in cream, butter, or high-fat cheese sauces. Limit coconut. Fruit in cream or custard. Protein  Allowed: Limit your intake of meat, seafood, and poultry to no more than 6 oz (cooked weight) per day. All lean, well-trimmed beef, veal, pork, and lamb. All chicken and [place] without skin. All fish and shellfish. Wild game: wild duck, rabbit, pheasant, and venison. Egg whites or low-cholesterol egg substitutes may be used as desired. Meatless dishes: recipes with dried beans, peas, lentils, and tofu (soybean curd). Seeds and nuts: all seeds and most nuts.  Avoid: Prime grade and  other heavily marbled and fatty meats, such as short ribs, spare ribs, rib eye roast or steak, frankfurters, sausage, bacon, and high-fat luncheon meats, mutton. Caviar. Commercially fried fish. Domestic duck, goose, venison sausage. Organ meats: liver, gizzard, heart, chitterlings, brains, kidney, sweetbreads. Dairy  Allowed: Low-fat cheeses: nonfat or low-fat cottage cheese (1% or 2% fat), cheeses made with part skim milk, such as mozzarella, farmers, string, or ricotta. (Cheeses should be labeled no more than 2 to 6 grams fat per oz.). Skim (or 1%) milk: liquid, powdered, or evaporated. Buttermilk made with low-fat milk. Drinks made with skim or low-fat milk or cocoa. Chocolate milk or cocoa made with skim or low-fat (1%) milk. Nonfat or low-fat yogurt.  Avoid: Whole milk cheeses, including colby, cheddar, [place], [address], [place], [place], [place], [address], [place], and blue. Creamed cottage cheese, cream cheese. Whole milk and whole milk products, including buttermilk or yogurt made from whole milk, drinks made from whole milk. Condensed milk, evaporated whole milk, and 2% milk. Soups and Combination Foods  Allowed: Low-fat low-sodium soups: broth, dehydrated soups, homemade broth, soups with the fat removed, homemade cream soups made with skim or low-fat milk. Low-fat spaghetti, lasagna, chili, and Spanish rice if low-fat ingredients and low-fat cooking techniques are used.  Avoid: Cream soups made with whole milk, cream, or high-fat cheese. All other soups.  Desserts and Sweets  Allowed: Sherbet, fruit ices, gelatins, meringues, and angel food cake. Homemade desserts with recommended fats, oils, and milk products. Jam, jelly, honey, marmalade, sugars, and syrups. Pure sugar candy, such as gum drops, hard candy, jelly beans, marshmallows, mints, and small amounts of dark chocolate.  Avoid: Commercially prepared cakes, pies, cookies, frosting, pudding, or mixes for these products. Desserts  containing whole milk products, chocolate, coconut, lard, palm oil, or palm kernel oil. Ice cream or ice cream drinks. Candy that contains chocolate, coconut, butter, hydrogenated fat, or unknown ingredients. Buttered syrups. Fats and Oils  Allowed: Vegetable oils: safflower, sunflower, corn, soybean, cottonseed, sesame, canola, olive, or peanut. Non-hydrogenated margarines. Salad dressing or mayonnaise: homemade or commercial, made with a recommended oil. Low or nonfat salad dressing or mayonnaise.  Limit added fats and oils to 6 to 8 tsp per day (includes fats used in cooking, baking, salads, and spreads on bread). Remember to count the "hidden fats" in foods.  Avoid: Solid fats and shortenings: butter, lard, salt pork, bacon drippings. Gravy containing meat fat, shortening, or suet. Cocoa butter, coconut. Coconut oil, palm oil, palm kernel oil, or hydrogenated oils: these ingredients are often used in bakery products, nondairy creamers, whipped toppings, candy, and commercially fried foods. Read labels carefully. Salad dressings made of unknown oils, sour cream, or cheese, such as blue cheese and [place]ef[place]. Cream, all kinds: half-and-half, light, heavy, or whipping. Sour cream or cream cheese (even if "light" or low-fat). Nondairy cream substitutes: coffee creamers and sour cream substitutes made with palm, palm kernel, hydrogenated oils, or coconut oil. Beverages  Allowed: Coffee (regular or decaffeinated), tea. Diet carbonated beverages, mineral water. Alcohol: Check with your caregiver. Moderation is recommended.  Avoid: Whole milk, regular sodas, and juice drinks with added sugar. Condiments  Allowed: All seasonings and condiments. Cocoa powder. "Cream" sauces made with recommended ingredients.  Avoid: Carob powder made with hydrogenated fats. SAMPLE MENU Breakfast   cup orange juice   cup oatmeal  1 slice toast  1 tsp margarine  1 cup skim milk Lunch  [place] sandwich with 2  oz [place], 2 slices bread  Lettuce and tomato slices  Fresh fruit  Carrot sticks  Coffee or tea Snack  Fresh fruit or low-fat crackers Dinner  3 oz lean ground beef  1 baked potato  1 tsp margarine   cup asparagus  Lettuce salad  1 tbs non-creamy dressing   cup peach slices  1 cup skim milk Document Released: 06/20/2008 Document Revised: 03/12/2012 Document Reviewed: 11/11/2013 ExitCare Patient Information 2015 [place], [place]. This information is not intended to replace advice given to you by your health care provider. Make sure you discuss any questions you have with your health care provider.

## 2014-12-17 NOTE — Assessment & Plan Note (Addendum)
Elevated today and has had multiple elevated readings at home. D/c'd Procardia XL since she's not had this in over a year. Started HCTZ today. Education provided on importance of diet and exercise. Follow up in 2 weeks for re-evaluation of BP.  BP Readings from Last 3 Encounters:  12/17/14 136/106  08/26/14 154/93  01/24/14 128/80

## 2014-12-17 NOTE — Progress Notes (Signed)
Pre visit review using our clinic review tool, if applicable. No additional management support is needed unless otherwise documented below in the visit note. 

## 2014-12-17 NOTE — Progress Notes (Signed)
Subjective:    Patient ID: Suzanne [name], female    DOB: [birth year], 29 y.o.   MRN: [id number]  HPI  Suzanne [name] is a 29 year old female who presents today to establish care and discuss the problems mentioned below. Will obtain old records.  1) Elevated blood pressure: Elevated readings since she gave birth to her son 1 year ago. She was placed on Procardia XL during pregnancy in November of 2014, and still has the same bottle with pills remaining. She's been checking her BP over the past several weeks and have noticed numerous readings that are greater than 140/90. She has not taken any medication for a year and is getting worried of her elevated readings. Denies chest pain, swelling, shortness of breath.  2) Headaches: She's been experiencing headaches intermittently for several weeks with an increase in frequency over past week. They are located bilaterally to the frontal lobes and describes them as pounding but tolerable. She's taken a a few BC powder which will work to dissipate her pain. Denies photophobia, phonophonia, nausea, and vomiting.   3) GERD: Has had reflux symptoms for several years but manages well off of medication. She has learned the triggering foods and tries to exclude them from her diet. When she gets her symptoms of burning and reflux of contents she'll take Tums or Pepcid which helps. Denies worsening symptoms or cough.  4) Obesity: She works full time at [employer] as a Production designer, theatre/television/film and is allowed to eat the food free of charge. She will eat several meals there and will typically pick a sandwich with fries. She would like to lose weight and will start working on efforts to do so by making healthier food choices at work and incorporate daily exercise. Her diet at home consists of potatoes, fried foods, and limited vegetables. She is currently not exercising but is on her feet for 8 hours at a time for work.  Review of Systems  Constitutional: Negative for fatigue and unexpected  weight change.  HENT: Negative for rhinorrhea.   Respiratory: Negative for cough and shortness of breath.   Cardiovascular: Negative for chest pain and leg swelling.  Gastrointestinal: Negative for diarrhea and constipation.  Endocrine: Negative for polydipsia, polyphagia and polyuria.  Genitourinary: Negative for dysuria and frequency.  Musculoskeletal: Negative for myalgias and arthralgias.  Skin: Negative for rash.  Neurological: Positive for headaches. Negative for dizziness.  Hematological: Negative for adenopathy.  Psychiatric/Behavioral:       Denies anxiety/depression       Past Medical History  Diagnosis Date  . Sickle cell trait   . Acid reflux   . Hypertension   . Blood transfusion without reported diagnosis     History   Social History  . Marital Status: Single    Spouse Name: N/A  . Number of Children: N/A  . Years of Education: N/A   Occupational History  . Not on file.   Social History Main Topics  . Smoking status: Never Smoker   . Smokeless tobacco: Never Used  . Alcohol Use: No  . Drug Use: No  . Sexual Activity:    Partners: Male   Other Topics Concern  . Not on file   Social History Narrative   Completed some collage   Works at [employer]   From [place]   Has one son, 1 years.   Enjoys playing with her son, sleeping.   Family is established here as well.    Past Surgical History  Procedure  Laterality Date  . Cesarean section N/A 10/08/2013    Procedure: CESAREAN SECTION;  Surgeon: Antionette Char, MD;  Location: [facility];  Service: Obstetrics;  Laterality: N/A;    Family History  Problem Relation Age of Onset  . Diabetes Mother   . Arthritis Mother   . Hypertension Mother   . Diabetes Maternal Grandmother   . Hypertension Maternal Grandmother   . Cancer Neg Hx   . Heart disease Neg Hx     Allergies  Allergen Reactions  . Hydralazine Itching  . Lasix [Furosemide] Swelling    Mouth and right side of face     No  current outpatient prescriptions on file prior to visit.   No current facility-administered medications on file prior to visit.    BP 136/106 mmHg  Pulse 90  Temp(Src) 97.4 F (36.3 C) (Oral)  Ht 4' 10.5" (1.486 m)  Wt 228 lb 6.4 oz (103.602 kg)  BMI 46.92 kg/m2  SpO2 97%  LMP 12/15/2014    Objective:   Physical Exam  Constitutional: She is oriented to person, place, and time. She appears well-developed.  HENT:  Head: Normocephalic.  Right Ear: External ear normal.  Left Ear: External ear normal.  Nose: Nose normal.  Mouth/Throat: Oropharynx is clear and moist.  Eyes: EOM are normal. Pupils are equal, round, and reactive to light.  Neck: Neck supple. No thyromegaly present.  Cardiovascular: Normal rate and regular rhythm.   Pulmonary/Chest: Effort normal and breath sounds normal.  Abdominal: Soft. Bowel sounds are normal. There is no tenderness.  Neurological: She is alert and oriented to person, place, and time. She has normal reflexes.  Skin: Skin is warm and dry.  Psychiatric: She has a normal mood and affect.          Assessment & Plan:

## 2014-12-17 NOTE — Assessment & Plan Note (Signed)
Body mass index is 46.92 kg/(m^2).  Discussed importance of weight loss with diet and exercise. We will re-evaluate this topic at each visit including in 2 weeks.

## 2014-12-21 ENCOUNTER — Telehealth: Payer: Self-pay | Admitting: Primary Care

## 2014-12-21 NOTE — Telephone Encounter (Signed)
emmi emailed °

## 2015-01-04 ENCOUNTER — Encounter: Payer: 59 | Admitting: Primary Care

## 2015-01-05 ENCOUNTER — Ambulatory Visit (INDEPENDENT_AMBULATORY_CARE_PROVIDER_SITE_OTHER): Payer: 59 | Admitting: Primary Care

## 2015-01-05 ENCOUNTER — Encounter: Payer: Self-pay | Admitting: Primary Care

## 2015-01-05 VITALS — BP 138/100 | HR 79 | Temp 97.9°F | Ht 59.0 in | Wt 234.4 lb

## 2015-01-05 DIAGNOSIS — I10 Essential (primary) hypertension: Secondary | ICD-10-CM

## 2015-01-05 DIAGNOSIS — Z130 Encounter for screening for diseases of the blood and blood-forming organs and certain disorders involving the immune mechanism: Secondary | ICD-10-CM | POA: Diagnosis not present

## 2015-01-05 DIAGNOSIS — R079 Chest pain, unspecified: Secondary | ICD-10-CM

## 2015-01-05 DIAGNOSIS — Z0000 Encounter for general adult medical examination without abnormal findings: Secondary | ICD-10-CM

## 2015-01-05 DIAGNOSIS — E669 Obesity, unspecified: Secondary | ICD-10-CM

## 2015-01-05 LAB — CBC WITH DIFFERENTIAL/PLATELET
Basophils Absolute: 0 10*3/uL (ref 0.0–0.1)
Basophils Relative: 0.3 % (ref 0.0–3.0)
Eosinophils Absolute: 0.2 10*3/uL (ref 0.0–0.7)
Eosinophils Relative: 3.3 % (ref 0.0–5.0)
HCT: 34.5 % — ABNORMAL LOW (ref 36.0–46.0)
Hemoglobin: 11.2 g/dL — ABNORMAL LOW (ref 12.0–15.0)
Lymphocytes Relative: 37.1 % (ref 12.0–46.0)
Lymphs Abs: 2.8 10*3/uL (ref 0.7–4.0)
MCHC: 32.4 g/dL (ref 30.0–36.0)
MCV: 72.9 fl — ABNORMAL LOW (ref 78.0–100.0)
Monocytes Absolute: 0.6 10*3/uL (ref 0.1–1.0)
Monocytes Relative: 7.7 % (ref 3.0–12.0)
Neutro Abs: 3.8 10*3/uL (ref 1.4–7.7)
Neutrophils Relative %: 51.6 % (ref 43.0–77.0)
Platelets: 305 10*3/uL (ref 150.0–400.0)
RBC: 4.74 Mil/uL (ref 3.87–5.11)
RDW: 16.4 % — ABNORMAL HIGH (ref 11.5–15.5)
WBC: 7.4 10*3/uL (ref 4.0–10.5)

## 2015-01-05 LAB — COMPREHENSIVE METABOLIC PANEL
ALT: 12 U/L (ref 0–35)
AST: 15 U/L (ref 0–37)
Albumin: 3.3 g/dL — ABNORMAL LOW (ref 3.5–5.2)
Alkaline Phosphatase: 54 U/L (ref 39–117)
BUN: 9 mg/dL (ref 6–23)
CO2: 31 mEq/L (ref 19–32)
Calcium: 9.4 mg/dL (ref 8.4–10.5)
Chloride: 103 mEq/L (ref 96–112)
Creatinine, Ser: 0.67 mg/dL (ref 0.40–1.20)
GFR: 136.36 mL/min (ref >60.00)
Glucose, Bld: 90 mg/dL (ref 70–99)
Potassium: 4.5 mEq/L (ref 3.5–5.1)
Sodium: 135 mEq/L (ref 135–145)
Total Bilirubin: 0.3 mg/dL (ref 0.2–1.2)
Total Protein: 7 g/dL (ref 6.0–8.3)

## 2015-01-05 LAB — LIPID PANEL
Cholesterol: 174 mg/dL (ref 0–200)
HDL: 47.6 mg/dL (ref >39.00)
LDL Cholesterol: 103 mg/dL — ABNORMAL HIGH (ref 0–99)
NonHDL: 126.4
Total CHOL/HDL Ratio: 4
Triglycerides: 117 mg/dL (ref 0.0–149.0)
VLDL: 23.4 mg/dL (ref 0.0–40.0)

## 2015-01-05 LAB — TSH: TSH: 1.5 u[IU]/mL (ref 0.35–4.50)

## 2015-01-05 MED ORDER — AMLODIPINE BESYLATE 5 MG PO TABS: 5.0000 mg | ORAL_TABLET | Freq: Every day | ORAL | Status: DC

## 2015-01-05 NOTE — Progress Notes (Addendum)
Subjective:    Patient ID: Suzanne [name], female    DOB: November 25, 1985, 29 y.o.   MRN: [id number]  HPI  Suzanne [name] is a 29 year old female who presents today for complete physical.  Immunizations: -Tetanus: Unsure -Influenza: Has not had the flu shot  Diet: Works at [employer] and typically eats the food at work due to it being heavily discounted. She does try to make some healthy choices with the grilled chicken and salads. Drinks hawaiian punch, juicy juice. Exercise: She does not routinely exercise, but she's on her feet at work during the day. Pap Smear: Last Pap one year ago.  Wt Readings from Last 3 Encounters:  01/05/15 234 lb 6.4 oz (106.323 kg)  12/17/14 228 lb 6.4 oz (103.602 kg)  01/24/14 220 lb (99.791 kg)   1) Hypertension: Was placed on HCTZ last visit due to a series of elevated readings. She took the medication for 3 days, but felt dizzy, "foggy", and her performance at work was sluggish. She does get occasional headaches, and will let them pass without having to take medication. Will get occasional chest pain when stressed at work. Denies radiation of pain, diaphoresis, and shortness of breath.  BP Readings from Last 3 Encounters:  01/05/15 138/100  12/17/14 136/106  08/26/14 154/93     Review of Systems  Constitutional: Negative for unexpected weight change.  HENT: Negative for rhinorrhea.   Respiratory: Negative for cough and shortness of breath.   Cardiovascular:       Occasional substernal, non- radiating, chest pain, last episode was yesterday.  Gastrointestinal: Negative for abdominal pain, diarrhea and constipation.  Genitourinary: Negative for dysuria, frequency and vaginal discharge.  Musculoskeletal: Negative for myalgias and arthralgias.  Skin: Negative for rash.  Neurological:       Occasional headaches. Did feel dizzy on the HCTZ, now without dizziness.  Hematological: Negative for adenopathy.  Psychiatric/Behavioral:       Denies concerns for  anxiety or depression       Past Medical History  Diagnosis Date  . Sickle cell trait   . Acid reflux   . Hypertension   . Blood transfusion without reported diagnosis     History   Social History  . Marital Status: Single    Spouse Name: N/A  . Number of Children: N/A  . Years of Education: N/A   Occupational History  . Not on file.   Social History Main Topics  . Smoking status: Never Smoker   . Smokeless tobacco: Never Used  . Alcohol Use: No  . Drug Use: No  . Sexual Activity:    Partners: Male   Other Topics Concern  . Not on file   Social History Narrative   Completed some collage   Works at [employer]   From [employer]   Has one son, 1 years.   Enjoys playing with her son, sleeping.   Family is established here as well.    Past Surgical History  Procedure Laterality Date  . Cesarean section N/A 10/08/2013    Procedure: CESAREAN SECTION;  Surgeon: Antionette Char, MD;  Location: [facility];  Service: Obstetrics;  Laterality: N/A;    Family History  Problem Relation Age of Onset  . Diabetes Mother   . Arthritis Mother   . Hypertension Mother   . Diabetes Maternal Grandmother   . Hypertension Maternal Grandmother   . Cancer Neg Hx   . Heart disease Neg Hx     Allergies  Allergen Reactions  .  Hydralazine Itching  . Lasix [Furosemide] Swelling    Mouth and right side of face     No current outpatient prescriptions on file prior to visit.   No current facility-administered medications on file prior to visit.    BP 138/100 mmHg  Pulse 79  Temp(Src) 97.9 F (36.6 C) (Oral)  Ht 4\' 11"  (1.499 m)  Wt 234 lb 6.4 oz (106.323 kg)  BMI 47.32 kg/m2  SpO2 98%  LMP 12/11/2014    Objective:   Physical Exam  Constitutional: She is oriented to person, place, and time. She appears well-developed.  HENT:  Right Ear: External ear normal.  Left Ear: External ear normal.  Nose: Nose normal.  Mouth/Throat: Oropharynx is clear and moist.  Eyes:  Conjunctivae and EOM are normal. Pupils are equal, round, and reactive to light.  Neck: Neck supple. No thyromegaly present.  Cardiovascular: Normal rate, regular rhythm and normal heart sounds.   Pulmonary/Chest: Effort normal and breath sounds normal.  Abdominal: Soft. Bowel sounds are normal. She exhibits no mass. There is no tenderness.  Musculoskeletal: Normal range of motion.  Lymphadenopathy:    She has no cervical adenopathy.  Neurological: She is alert and oriented to person, place, and time. She has normal reflexes. No cranial nerve deficit.  Skin: Skin is warm and dry.  Psychiatric: She has a normal mood and affect.          Assessment & Plan:  >25 minutes spent face to face with patient, >50% spent counseling or coordinating care, especially with the importance of weight loss and gaining control of her elevated blood pressure.  ECG: Normal sinus rhythm, normal rate, no PVC's, no ST changes, unchanged from prior ECG.

## 2015-01-05 NOTE — Patient Instructions (Signed)
Please obtain lab work prior to leaving today. I will call you with your results. Start Amlodipine 5mg  daily for your blood pressure. Please try to cut out sugary drinks like juices and sodas and substitute with water or crystal light. Measure your blood pressure daily and start recording them. Bring them to your next appointment. Follow up in 2 weeks for re-check of your blood pressure.  Hypertension Hypertension, commonly called high blood pressure, is when the force of blood pumping through your arteries is too strong. Your arteries are the blood vessels that carry blood from your heart throughout your body. A blood pressure reading consists of a higher number over a lower number, such as 110/72. The higher number (systolic) is the pressure inside your arteries when your heart pumps. The lower number (diastolic) is the pressure inside your arteries when your heart relaxes. Ideally you want your blood pressure below 120/80. Hypertension forces your heart to work harder to pump blood. Your arteries may become narrow or stiff. Having hypertension puts you at risk for heart disease, stroke, and other problems.  RISK FACTORS Some risk factors for high blood pressure are controllable. Others are not.  Risk factors you cannot control include:   Race. You may be at higher risk if you are African American.  Age. Risk increases with age.  Gender. Men are at higher risk than women before age 2 years. After age 29, women are at higher risk than men. Risk factors you can control include:  Not getting enough exercise or physical activity.  Being overweight.  Getting too much fat, sugar, calories, or salt in your diet.  Drinking too much alcohol. SIGNS AND SYMPTOMS Hypertension does not usually cause signs or symptoms. Extremely high blood pressure (hypertensive crisis) may cause headache, anxiety, shortness of breath, and nosebleed. DIAGNOSIS  To check if you have hypertension, your health care  provider will measure your blood pressure while you are seated, with your arm held at the level of your heart. It should be measured at least twice using the same arm. Certain conditions can cause a difference in blood pressure between your right and left arms. A blood pressure reading that is higher than normal on one occasion does not mean that you need treatment. If one blood pressure reading is high, ask your health care provider about having it checked again. TREATMENT  Treating high blood pressure includes making lifestyle changes and possibly taking medicine. Living a healthy lifestyle can help lower high blood pressure. You may need to change some of your habits. Lifestyle changes may include:  Following the DASH diet. This diet is high in fruits, vegetables, and whole grains. It is low in salt, red meat, and added sugars.  Getting at least 2 hours of brisk physical activity every week.  Losing weight if necessary.  Not smoking.  Limiting alcoholic beverages.  Learning ways to reduce stress. If lifestyle changes are not enough to get your blood pressure under control, your health care provider may prescribe medicine. You may need to take more than one. Work closely with your health care provider to understand the risks and benefits. HOME CARE INSTRUCTIONS  Have your blood pressure rechecked as directed by your health care provider.   Take medicines only as directed by your health care provider. Follow the directions carefully. Blood pressure medicines must be taken as prescribed. The medicine does not work as well when you skip doses. Skipping doses also puts you at risk for problems.   Do  not smoke.   Monitor your blood pressure at home as directed by your health care provider. SEEK MEDICAL CARE IF:   You think you are having a reaction to medicines taken.  You have recurrent headaches or feel dizzy.  You have swelling in your ankles.  You have trouble with your  vision. SEEK IMMEDIATE MEDICAL CARE IF:  You develop a severe headache or confusion.  You have unusual weakness, numbness, or feel faint.  You have severe chest or abdominal pain.  You vomit repeatedly.  You have trouble breathing. MAKE SURE YOU:   Understand these instructions.  Will watch your condition.  Will get help right away if you are not doing well or get worse. Document Released: 09/11/2005 Document Revised: 01/26/2014 Document Reviewed: 07/04/2013 [facility] Patient Information 2015 [place], [place]. This information is not intended to replace advice given to you by your health care provider. Make sure you discuss any questions you have with your health care provider.

## 2015-01-05 NOTE — Progress Notes (Signed)
Pre visit review using our clinic review tool, if applicable. No additional management support is needed unless otherwise documented below in the visit note. 

## 2015-01-06 DIAGNOSIS — Z0000 Encounter for general adult medical examination without abnormal findings: Secondary | ICD-10-CM | POA: Insufficient documentation

## 2015-01-06 NOTE — Assessment & Plan Note (Signed)
No improvement. Continues to eat food at work, but is trying to make healthier choices. She's drinking a lot of her calories so we discussed to stop drinking sugary juices, and start drinking water, diet sodas, unsweet tea. Stressed the importance of weight loss and its effects on her health long term.

## 2015-01-06 NOTE — Addendum Note (Signed)
Addended by: Baldomero Lamy on: 01/06/2015 03:57 PM   Modules accepted: Kipp Brood

## 2015-01-06 NOTE — Assessment & Plan Note (Signed)
No improvement. Took HCTZ for three days and stopped due to effects. Started Amlodipine 5mg  during visit and instructed her of the side effects. She is to follow up in 2 weeks and also work on healthy diet and exercise.

## 2015-01-06 NOTE — Assessment & Plan Note (Signed)
Overall well. Will look up last Tetanus and administer if needed. Obtained labs today, will review with patient. Discussed healthy diet and exercise mentioned above.

## 2015-01-11 NOTE — Addendum Note (Signed)
Addended by: Doreene Nest on: 01/11/2015 12:00 PM   Modules accepted: Kipp Brood

## 2015-01-19 ENCOUNTER — Ambulatory Visit: Payer: 59 | Admitting: Primary Care

## 2015-04-30 ENCOUNTER — Ambulatory Visit (INDEPENDENT_AMBULATORY_CARE_PROVIDER_SITE_OTHER): Payer: 59 | Admitting: Primary Care

## 2015-04-30 ENCOUNTER — Encounter: Payer: Self-pay | Admitting: Primary Care

## 2015-04-30 VITALS — BP 116/72 | HR 85 | Temp 98.2°F | Ht 59.0 in | Wt 227.8 lb

## 2015-04-30 DIAGNOSIS — Z131 Encounter for screening for diabetes mellitus: Secondary | ICD-10-CM | POA: Diagnosis not present

## 2015-04-30 DIAGNOSIS — Z3049 Encounter for surveillance of other contraceptives: Secondary | ICD-10-CM

## 2015-04-30 DIAGNOSIS — Z3042 Encounter for surveillance of injectable contraceptive: Secondary | ICD-10-CM

## 2015-04-30 DIAGNOSIS — D649 Anemia, unspecified: Secondary | ICD-10-CM | POA: Diagnosis not present

## 2015-04-30 DIAGNOSIS — I10 Essential (primary) hypertension: Secondary | ICD-10-CM

## 2015-04-30 DIAGNOSIS — IMO0001 Reserved for inherently not codable concepts without codable children: Secondary | ICD-10-CM | POA: Insufficient documentation

## 2015-04-30 DIAGNOSIS — E669 Obesity, unspecified: Secondary | ICD-10-CM

## 2015-04-30 LAB — CBC
HCT: 40.3 % (ref 36.0–46.0)
Hemoglobin: 13 g/dL (ref 12.0–15.0)
MCHC: 32.2 g/dL (ref 30.0–36.0)
MCV: 72.9 fl — ABNORMAL LOW (ref 78.0–100.0)
Platelets: 323 10*3/uL (ref 150.0–400.0)
RBC: 5.54 Mil/uL — ABNORMAL HIGH (ref 3.87–5.11)
RDW: 14.9 % (ref 11.5–15.5)
WBC: 8.3 10*3/uL (ref 4.0–10.5)

## 2015-04-30 LAB — HEMOGLOBIN A1C: Hgb A1c MFr Bld: 5.4 % (ref 4.6–6.5)

## 2015-04-30 MED ORDER — MEDROXYPROGESTERONE ACETATE 150 MG/ML IM SUSP
150.0000 mg | Freq: Once | INTRAMUSCULAR | Status: AC
  Administered 2015-04-30: 150 mg via INTRAMUSCULAR

## 2015-04-30 NOTE — Progress Notes (Signed)
Pre visit review using our clinic review tool, if applicable. No additional management support is needed unless otherwise documented below in the visit note. 

## 2015-04-30 NOTE — Patient Instructions (Addendum)
It is important that you improve your diet. Please limit carbohydrates in the form of white bread, rice, pasta, fast food, fried foods, cakes, cookies, sugary drinks, etc. Increase your consumption of fresh fruits and vegetables. Be sure to drink plenty of water daily.  You should start exercising for 45 minutes at least 3 days a week.  Both the healthy diet and exercise are important to reduce your risk for diabetes.  Complete lab work prior to leaving today. I will notify you of your results.  You've been provided with your first shot of Depo-Provera. You will need back up protection for the next 7 days.  Follow up October 21st through November 4th for an office visit with me and to receive your next injection.   It was a pleasure to see you today!

## 2015-04-30 NOTE — Assessment & Plan Note (Signed)
Weight loss of 7 pounds since last visit. She is decreasing portion sizes and has been under stress which has caused her to eat less. Poor diet overall. Discussed her risk for diabetes and how to prevent with healthy diet and exercise. Will continue to monitor.

## 2015-04-30 NOTE — Assessment & Plan Note (Signed)
Stable on amlodipine today. She has difficulty remembering to take her medication. She will set an alarm on her phone to help her remember. Denies headaches, chest pain, dizziness. Continue same, follow up in October.

## 2015-04-30 NOTE — Progress Notes (Signed)
Subjective:    Patient ID: Suzanne [name], female    DOB: 11-05-1985, 29 y.o.   MRN: [id number]  HPI  Suzanne [name] is a 29 year old female who presents today for follow up.  1) Essential hypertension: Managed on amlodipine 5 mg tablets. She will take her amlodipine 3-4 times a week when she remembers. Headaches are improved. She denies shortness of breath. She reports increased stress over the past several weeks and admits to an episode of chest pain which resolved when she relaxed.  2) Initiation of birth control: She was once managed on the pill several months ago but has been out of refills since the end of July. She has a difficult time remembering to take her medications and is interested in the Depo injection. Denies history of blood clots, does not smoke, no family or personal history of breast cancer. LMP was beginning of July.   3) Obesity: She endorses that she is working to improve her diet. Her diet consists of: Breakfast: Bagel or occasional biscuit. Mostly skips. Lunch: Sometimes skips, will have double cheeseburger or chicken nuggets at work. Dinner: Brett Albino, chips, cookies. Beverages: Drinks sweet tea and Dr. Reino Kent. She's been working to drink more water.  Wt Readings from Last 3 Encounters:  04/30/15 227 lb 12.8 oz (103.329 kg)  01/05/15 234 lb 6.4 oz (106.323 kg)  12/17/14 228 lb 6.4 oz (103.602 kg)     Review of Systems  Respiratory: Negative for shortness of breath.   Cardiovascular: Negative for chest pain.  Genitourinary: Negative for menstrual problem.  Neurological: Negative for dizziness and headaches.       Past Medical History  Diagnosis Date  . Sickle cell trait   . Acid reflux   . Hypertension   . Blood transfusion without reported diagnosis     History   Social History  . Marital Status: Single    Spouse Name: N/A  . Number of Children: N/A  . Years of Education: N/A   Occupational History  . Not on file.   Social History Main Topics  .  Smoking status: Never Smoker   . Smokeless tobacco: Never Used  . Alcohol Use: No  . Drug Use: No  . Sexual Activity:    Partners: Male   Other Topics Concern  . Not on file   Social History Narrative   Completed some collage   Works at [employer]   From [place]   Has one son, 1 years.   Enjoys playing with her son, sleeping.   Family is established here as well.    Past Surgical History  Procedure Laterality Date  . Cesarean section N/A 10/08/2013    Procedure: CESAREAN SECTION;  Surgeon: Antionette Char, MD;  Location: [facility];  Service: Obstetrics;  Laterality: N/A;    Family History  Problem Relation Age of Onset  . Diabetes Mother   . Arthritis Mother   . Hypertension Mother   . Diabetes Maternal Grandmother   . Hypertension Maternal Grandmother   . Cancer Neg Hx   . Heart disease Neg Hx     Allergies  Allergen Reactions  . Hydralazine Itching  . Lasix [Furosemide] Swelling    Mouth and right side of face     Current Outpatient Prescriptions on File Prior to Visit  Medication Sig Dispense Refill  . amLODipine (NORVASC) 5 MG tablet Take 1 tablet (5 mg total) by mouth daily. 30 tablet 2   No current facility-administered medications on file prior  to visit.    BP 116/72 mmHg  Pulse 85  Temp(Src) 98.2 F (36.8 C) (Oral)  Ht 4\' 11"  (1.499 m)  Wt 227 lb 12.8 oz (103.329 kg)  BMI 45.99 kg/m2  SpO2 97%  LMP 03/26/2015    Objective:   Physical Exam  Constitutional: She appears well-nourished.  Cardiovascular: Normal rate and regular rhythm.   Pulmonary/Chest: Effort normal and breath sounds normal.  Skin: Skin is warm and dry.  Psychiatric: She has a normal mood and affect.          Assessment & Plan:

## 2015-04-30 NOTE — Assessment & Plan Note (Signed)
History of. Will check CBC today. She is asymptomatic.

## 2015-04-30 NOTE — Assessment & Plan Note (Signed)
Urine pregnancy negative today. Will administer depo as she is a poor candidate for pills. Discussed potential side effects and when to come back for her next injection. She does not smoke and has no history of blood clots.

## 2015-05-03 ENCOUNTER — Encounter: Payer: Self-pay | Admitting: *Deleted

## 2015-07-16 ENCOUNTER — Encounter: Payer: Self-pay | Admitting: Primary Care

## 2015-07-16 ENCOUNTER — Ambulatory Visit (INDEPENDENT_AMBULATORY_CARE_PROVIDER_SITE_OTHER): Payer: 59 | Admitting: Primary Care

## 2015-07-16 ENCOUNTER — Encounter: Payer: Self-pay | Admitting: *Deleted

## 2015-07-16 VITALS — BP 126/84 | HR 83 | Temp 97.9°F | Ht 59.0 in | Wt 226.8 lb

## 2015-07-16 DIAGNOSIS — I10 Essential (primary) hypertension: Secondary | ICD-10-CM | POA: Diagnosis not present

## 2015-07-16 DIAGNOSIS — Z3049 Encounter for surveillance of other contraceptives: Secondary | ICD-10-CM

## 2015-07-16 DIAGNOSIS — Z3042 Encounter for surveillance of injectable contraceptive: Secondary | ICD-10-CM

## 2015-07-16 DIAGNOSIS — E669 Obesity, unspecified: Secondary | ICD-10-CM

## 2015-07-16 LAB — BASIC METABOLIC PANEL
BUN: 9 mg/dL (ref 6–23)
CO2: 31 mEq/L (ref 19–32)
Calcium: 9.8 mg/dL (ref 8.4–10.5)
Chloride: 103 mEq/L (ref 96–112)
Creatinine, Ser: 0.68 mg/dL (ref 0.40–1.20)
GFR: 133.52 mL/min (ref >60.00)
Glucose, Bld: 98 mg/dL (ref 70–99)
Potassium: 4.5 mEq/L (ref 3.5–5.1)
Sodium: 137 mEq/L (ref 135–145)

## 2015-07-16 MED ORDER — MEDROXYPROGESTERONE ACETATE 150 MG/ML IM SUSP
150.0000 mg | Freq: Once | INTRAMUSCULAR | Status: AC
  Administered 2015-07-16: 150 mg via INTRAMUSCULAR

## 2015-07-16 NOTE — Progress Notes (Signed)
Pre visit review using our clinic review tool, if applicable. No additional management support is needed unless otherwise documented below in the visit note. 

## 2015-07-16 NOTE — Patient Instructions (Signed)
Continue the Amlodipine 5mg  blood pressure medication. Try to remember to take this daily.  Complete lab work prior to leaving today. I will notify you of your results.  You've been provided with an Depo Injection today.   Schedule a nurse only visit for the next Depo Injection which will be roughly in early January.  Please schedule a follow up appointment in 6 months for re-check of blood pressure and Depo Injection.   It was a pleasure to see you today!

## 2015-07-16 NOTE — Assessment & Plan Note (Signed)
Discussed the importance of healthy diet and exercise.

## 2015-07-16 NOTE — Assessment & Plan Note (Signed)
BP stable since last visit. Continue Amlodipine 5 mg. BMP today and is pending. Follow up in 6 months for re-evaluation.

## 2015-07-16 NOTE — Progress Notes (Signed)
Subjective:    Patient ID: Suzanne [name], female    DOB: 1985/12/11, 29 y.o.   MRN: [id number]  HPI  Suzanne [name] is a 29 year old female who presents today for follow up of hypertension. She was initiated on Amlodipine 5 mg several visits ago for uncontrolled hypertension. Last visit she endorsed that she took her medication 3-4 times weekly as she frequently forgot to take.  Since her last visit her blood pressure has been stable. She denies dizziness, headaches. She has been attempting to take her medication more routinely but will sometimes miss a dose. Overall she's feeling well.   BP Readings from Last 3 Encounters:  07/16/15 126/84  04/30/15 116/72  01/05/15 138/100     Wt Readings from Last 3 Encounters:  07/16/15 226 lb 12.8 oz (102.876 kg)  04/30/15 227 lb 12.8 oz (103.329 kg)  01/05/15 234 lb 6.4 oz (106.323 kg)     Review of Systems  Respiratory: Negative for shortness of breath.   Cardiovascular: Negative for chest pain.  Neurological: Negative for dizziness, numbness and headaches.       Past Medical History  Diagnosis Date  . Sickle cell trait (HCC)   . Acid reflux   . Hypertension   . Blood transfusion without reported diagnosis     Social History   Social History  . Marital Status: Single    Spouse Name: N/A  . Number of Children: N/A  . Years of Education: N/A   Occupational History  . Not on file.   Social History Main Topics  . Smoking status: Never Smoker   . Smokeless tobacco: Never Used  . Alcohol Use: No  . Drug Use: No  . Sexual Activity:    Partners: Male   Other Topics Concern  . Not on file   Social History Narrative   Completed some collage   Works at [employer]   From [employer]   Has one son, 1 years.   Enjoys playing with her son, sleeping.   Family is established here as well.    Past Surgical History  Procedure Laterality Date  . Cesarean section N/A 10/08/2013    Procedure: CESAREAN SECTION;  Surgeon: Antionette Char, MD;  Location: [facility];  Service: Obstetrics;  Laterality: N/A;    Family History  Problem Relation Age of Onset  . Diabetes Mother   . Arthritis Mother   . Hypertension Mother   . Diabetes Maternal Grandmother   . Hypertension Maternal Grandmother   . Cancer Neg Hx   . Heart disease Neg Hx     Allergies  Allergen Reactions  . Hydralazine Itching  . Lasix [Furosemide] Swelling    Mouth and right side of face     Current Outpatient Prescriptions on File Prior to Visit  Medication Sig Dispense Refill  . amLODipine (NORVASC) 5 MG tablet Take 1 tablet (5 mg total) by mouth daily. 30 tablet 2   No current facility-administered medications on file prior to visit.    BP 126/84 mmHg  Pulse 83  Temp(Src) 97.9 F (36.6 C) (Oral)  Ht 4\' 11"  (1.499 m)  Wt 226 lb 12.8 oz (102.876 kg)  BMI 45.78 kg/m2  SpO2 97%    Objective:   Physical Exam  Constitutional: She appears well-nourished.  Cardiovascular: Normal rate and regular rhythm.   Pulmonary/Chest: Effort normal and breath sounds normal.  Skin: Skin is warm and dry.          Assessment & Plan:

## 2015-10-05 ENCOUNTER — Ambulatory Visit: Payer: 59

## 2015-10-15 ENCOUNTER — Ambulatory Visit (INDEPENDENT_AMBULATORY_CARE_PROVIDER_SITE_OTHER): Payer: BLUE CROSS/BLUE SHIELD

## 2015-10-15 DIAGNOSIS — Z3042 Encounter for surveillance of injectable contraceptive: Secondary | ICD-10-CM

## 2015-10-15 MED ORDER — MEDROXYPROGESTERONE ACETATE 150 MG/ML IM SUSP
150.0000 mg | Freq: Once | INTRAMUSCULAR | Status: AC
  Administered 2015-10-15: 150 mg via INTRAMUSCULAR

## 2015-12-09 ENCOUNTER — Ambulatory Visit: Payer: BLUE CROSS/BLUE SHIELD | Admitting: Family Medicine

## 2015-12-10 ENCOUNTER — Ambulatory Visit (INDEPENDENT_AMBULATORY_CARE_PROVIDER_SITE_OTHER): Payer: BLUE CROSS/BLUE SHIELD | Admitting: Primary Care

## 2015-12-10 ENCOUNTER — Encounter: Payer: Self-pay | Admitting: Primary Care

## 2015-12-10 VITALS — BP 136/94 | HR 86 | Temp 98.1°F | Ht 59.0 in | Wt 236.1 lb

## 2015-12-10 DIAGNOSIS — I10 Essential (primary) hypertension: Secondary | ICD-10-CM | POA: Diagnosis not present

## 2015-12-10 MED ORDER — AMLODIPINE BESYLATE 5 MG PO TABS: 5.0000 mg | ORAL_TABLET | Freq: Every day | ORAL | Status: DC

## 2015-12-10 NOTE — Progress Notes (Signed)
Subjective:    Patient ID: Suzanne [name], female    DOB: [birth year], 30 y.o.   MRN: [id number]  HPI  Suzanne [name] is a 30 year old female who presents today with a chief complaint of diarrhea. She also reports nausea, fatigue, abdominal cramping, and headache. Her diarrhea has been present since Monday night. She's been taking Pepto Bismol over the last several days with improvement in diarrhea. She's not had diarrhea since Wednesday. Now she's feeling full of gas, tired and weak. She's been been eating twice daily and has had little fluid intake with ginger ale. She's not taking her Amlodipine as prescribed as she ran out several months ago and did not refill.  She does endorse feeling stressed as she has a lot of responsibilities at work and has not had much time off. Denies fevers, vomiting, cough, sore throat. Overall she's feeling better.  Review of Systems  Constitutional: Positive for fatigue. Negative for fever.  Respiratory: Negative for shortness of breath.   Gastrointestinal: Negative for nausea, vomiting, abdominal pain and diarrhea.       Feeling gassy.   Neurological: Negative for dizziness and weakness.       Past Medical History  Diagnosis Date  . Sickle cell trait (HCC)   . Acid reflux   . Hypertension   . Blood transfusion without reported diagnosis     Social History   Social History  . Marital Status: Single    Spouse Name: N/A  . Number of Children: N/A  . Years of Education: N/A   Occupational History  . Not on file.   Social History Main Topics  . Smoking status: Never Smoker   . Smokeless tobacco: Never Used  . Alcohol Use: No  . Drug Use: No  . Sexual Activity:    Partners: Male   Other Topics Concern  . Not on file   Social History Narrative   Completed some collage   Works at [employer]   From [employer]   Has one son, 1 years.   Enjoys playing with her son, sleeping.   Family is established here as well.    Past Surgical History    Procedure Laterality Date  . Cesarean section N/A 10/08/2013    Procedure: CESAREAN SECTION;  Surgeon: Antionette Char, MD;  Location: [facility];  Service: Obstetrics;  Laterality: N/A;    Family History  Problem Relation Age of Onset  . Diabetes Mother   . Arthritis Mother   . Hypertension Mother   . Diabetes Maternal Grandmother   . Hypertension Maternal Grandmother   . Cancer Neg Hx   . Heart disease Neg Hx     Allergies  Allergen Reactions  . Hydralazine Itching  . Lasix [Furosemide] Swelling    Mouth and right side of face     No current outpatient prescriptions on file prior to visit.   No current facility-administered medications on file prior to visit.    BP 136/94 mmHg  Pulse 86  Temp(Src) 98.1 F (36.7 C) (Oral)  Ht  (1.499 m)  Wt 236 lb 1.9 oz (107.103 kg)  BMI 47.66 kg/m2  SpO2 98%    Objective:   Physical Exam  Constitutional: She appears well-nourished.  Cardiovascular: Normal rate and regular rhythm.   Pulmonary/Chest: Effort normal and breath sounds normal.  Abdominal: Soft. Bowel sounds are normal. There is no tenderness.  Skin: Skin is warm and dry.          Assessment &  Plan:  Diarrhea:  Present since Monday night with fatigue, nausea, abdominal cramping. She's taking pepto bismol. No diarrhea since Wednesday this week. Nausea has improved. Cramping has improved.Continues to feel fatigued.  Exam unremarkable. Does not appear ill. Supportive measures provided.  Refills provided for amlodipine and stressed the importance of compliance. Encouraged hydration with water rather than ginger ale. Work note provided for excuse.  Return precautions provided.

## 2015-12-10 NOTE — Progress Notes (Signed)
Pre visit review using our clinic review tool, if applicable. No additional management support is needed unless otherwise documented below in the visit note. 

## 2015-12-10 NOTE — Patient Instructions (Addendum)
Please restart your Amlodipine for high blood pressure. Take 1 tablet by mouth every day.  Ensure you are staying hydrated with water.   Please call me if your diarrhea returns, you develop fevers, your fatigue becomes worse.  I'll see you in April as scheduled.  It was a pleasure to see you today!

## 2016-01-03 ENCOUNTER — Emergency Department (HOSPITAL_COMMUNITY)
Admission: EM | Admit: 2016-01-03 | Discharge: 2016-01-03 | Disposition: A | Discharge status: Home / Self Care | Payer: BLUE CROSS/BLUE SHIELD | Attending: Emergency Medicine | Admitting: Emergency Medicine

## 2016-01-03 ENCOUNTER — Encounter (HOSPITAL_COMMUNITY): Payer: Self-pay | Admitting: Emergency Medicine

## 2016-01-03 ENCOUNTER — Emergency Department (HOSPITAL_COMMUNITY): Payer: BLUE CROSS/BLUE SHIELD

## 2016-01-03 DIAGNOSIS — J069 Acute upper respiratory infection, unspecified: Secondary | ICD-10-CM | POA: Diagnosis not present

## 2016-01-03 DIAGNOSIS — I10 Essential (primary) hypertension: Secondary | ICD-10-CM | POA: Insufficient documentation

## 2016-01-03 DIAGNOSIS — Z79899 Other long term (current) drug therapy: Secondary | ICD-10-CM | POA: Insufficient documentation

## 2016-01-03 DIAGNOSIS — R05 Cough: Secondary | ICD-10-CM | POA: Diagnosis not present

## 2016-01-03 DIAGNOSIS — Z862 Personal history of diseases of the blood and blood-forming organs and certain disorders involving the immune mechanism: Secondary | ICD-10-CM | POA: Diagnosis not present

## 2016-01-03 DIAGNOSIS — Z8719 Personal history of other diseases of the digestive system: Secondary | ICD-10-CM | POA: Insufficient documentation

## 2016-01-03 LAB — RAPID STREP SCREEN (MED CTR MEBANE ONLY): Streptococcus, Group A Screen (Direct): NEGATIVE

## 2016-01-03 MED ORDER — ACETAMINOPHEN 325 MG PO TABS
650.0000 mg | ORAL_TABLET | Freq: Once | ORAL | Status: AC
  Administered 2016-01-03: 650 mg via ORAL
  Filled 2016-01-03: qty 2

## 2016-01-03 MED ORDER — BENZONATATE 100 MG PO CAPS: 100.0000 mg | ORAL_CAPSULE | Freq: Three times a day (TID) | ORAL | Status: DC | PRN

## 2016-01-03 MED ORDER — IBUPROFEN 200 MG PO TABS
400.0000 mg | ORAL_TABLET | Freq: Once | ORAL | Status: AC
  Administered 2016-01-03: 400 mg via ORAL
  Filled 2016-01-03: qty 2

## 2016-01-03 MED ORDER — IBUPROFEN 200 MG PO TABS: 400.0000 mg | ORAL_TABLET | Freq: Once | ORAL | Status: DC

## 2016-01-03 NOTE — ED Notes (Signed)
Pt c/o generalized body aches, runny nose, cough, sore throat since last Wednesday. States mother has been sick recently, no fever in triage, did not take antipyretics prior to arrival. Denies nausea, vomiting, diarrhea

## 2016-01-03 NOTE — Discharge Instructions (Signed)
Take over the counter decongestant (such as high blood pressure formulation of sudafed), as directed on packaging, for the next week.  Use over the counter normal saline nasal spray, as instructed in the Emergency Department, several times per day for the next 2 weeks. Take over the counter tylenol and ibuprofen, as directed on packaging, as needed for discomfort.  Gargle with warm water several times per day to help with discomfort.  May also use over the counter sore throat pain medicines such as chloraseptic or sucrets, as directed on packaging, as needed for discomfort.  Call your regular medical doctor today to schedule a follow up appointment this week.  Return to the Emergency Department immediately if worsening.

## 2016-01-03 NOTE — ED Provider Notes (Signed)
CSN: [id number]     Arrival date & time 01/03/16  0803 History   First MD Initiated Contact with Patient 01/03/16 [phone]     Chief Complaint  Patient presents with  . Generalized Body Aches  . Cough  . Sore Throat      HPI Pt was seen at 0835.  Per pt, c/o gradual onset and persistence of constant sore throat, runny/stuffy nose, sinus congestion, and cough for the past 5 days. Has been associated with generalized body aches/fatigue. Others in household have similar symptoms.  Denies fevers, no rash, no CP/SOB, no N/V/D, no abd pain.     Past Medical History  Diagnosis Date  . Sickle cell trait (HCC)   . Acid reflux   . Hypertension   . Blood transfusion without reported diagnosis    Past Surgical History  Procedure Laterality Date  . Cesarean section N/A 10/08/2013    Procedure: CESAREAN SECTION;  Surgeon: Antionette Char, MD;  Location: [facility];  Service: Obstetrics;  Laterality: N/A;   Family History  Problem Relation Age of Onset  . Diabetes Mother   . Arthritis Mother   . Hypertension Mother   . Diabetes Maternal Grandmother   . Hypertension Maternal Grandmother   . Cancer Neg Hx   . Heart disease Neg Hx    Social History  Substance Use Topics  . Smoking status: Never Smoker   . Smokeless tobacco: Never Used  . Alcohol Use: No   OB History    Gravida Para Term Preterm AB TAB SAB Ectopic Multiple Living   Review of Systems ROS: Statement: All systems negative except as marked or noted in the HPI; Constitutional: Negative for fever and chills. +generalized body aches/fatigue.; ; Eyes: Negative for eye pain, redness and discharge. ; ; ENMT: Negative for ear pain, hoarseness, +nasal congestion, sinus pressure and sore throat. ; ; Cardiovascular: Negative for chest pain, palpitations, diaphoresis, dyspnea and peripheral edema. ; ; Respiratory: +cough. Negative for wheezing and stridor. ; ; Gastrointestinal: Negative for nausea, vomiting, diarrhea,  abdominal pain, blood in stool, hematemesis, jaundice and rectal bleeding. . ; ; Genitourinary: Negative for dysuria, flank pain and hematuria. ; ; Musculoskeletal: Negative for back pain and neck pain. Negative for swelling and trauma.; ; Skin: Negative for pruritus, rash, abrasions, blisters, bruising and skin lesion.; ; Neuro: Negative for headache, lightheadedness and neck stiffness. Negative for weakness, altered level of consciousness , altered mental status, extremity weakness, paresthesias, involuntary movement, seizure and syncope.      Allergies  Hydralazine and Lasix  Home Medications   Prior to Admission medications   Medication Sig Start Date End Date Taking? Authorizing Provider  amLODipine (NORVASC) 5 MG tablet Take 1 tablet (5 mg total) by mouth daily. 12/10/15   Doreene Nest, NP   BP 130/90 mmHg  Pulse 92  Temp(Src) 98.2 F (36.8 C) (Oral)  Resp 16  SpO2 100% Physical Exam 0840: Physical examination:  Nursing notes reviewed; Vital signs and O2 SAT reviewed;  Constitutional: Well developed, Well nourished, Well hydrated, In no acute distress; Head:  Normocephalic, atraumatic; Eyes: EOMI, PERRL, No scleral icterus; ENMT: TM's clear bilat. +edemetous nasal turbinates bilat with clear rhinorrhea. Mouth and pharynx without lesions. No tonsillar exudates. No intra-oral edema. No submandibular or sublingual edema. No hoarse voice, no drooling, no stridor. No pain with manipulation of larynx. No trismus. Mouth and pharynx normal, Mucous membranes moist; Neck:  Supple, Full range of motion, No lymphadenopathy; Cardiovascular: Regular rate and rhythm, No murmur, rub, or gallop; Respiratory: Breath sounds clear & equal bilaterally, No rales, rhonchi, wheezes.  Speaking full sentences with ease, Normal respiratory effort/excursion; Chest: Nontender, Movement normal; Abdomen: Soft, Nontender, Nondistended, Normal bowel sounds; Genitourinary: No CVA tenderness; Extremities: Pulses normal,  No tenderness, No edema, No calf edema or asymmetry.; Neuro: AA&Ox3, Major CN grossly intact.  Speech clear. No gross focal motor or sensory deficits in extremities. Climbs on and off stretcher easily by herself. Gait steady.; Skin: Color normal, Warm, Dry.   ED Course  Procedures (including critical care time) Labs Review  Imaging Review  I have personally reviewed and evaluated these images and lab results as part of my medical decision-making.   EKG Interpretation None      MDM  MDM Reviewed: previous chart, nursing note and vitals Interpretation: labs and x-ray      Results for orders placed or performed during the hospital encounter of 01/03/16  Rapid strep screen (not at [facility])  Result Value Ref Range   Streptococcus, Group A Screen (Direct) NEGATIVE NEGATIVE   Dg Chest 2 View 01/03/2016  CLINICAL DATA:  Productive cough for 5 days, sore throat, and body aches. EXAM: CHEST  2 VIEW COMPARISON:  Chest CT and radiographs 10/15/2013 FINDINGS: The cardiomediastinal silhouette is within normal limits. There is improved aeration of the lung bases compared to the prior studies. No airspace consolidation, edema, pleural effusion, or pneumothorax is currently identified. No acute osseous abnormality is seen. IMPRESSION: No active cardiopulmonary disease. Electronically Signed   By: Sebastian Ache M.D.   On: 01/03/2016 09:01    0920:  Workup reassuring. Tx symptomatically, f/u PMD. Dx and testing d/w pt.  Questions answered.  Verb understanding, agreeable to d/c home with outpt f/u.   Samuel Jester, DO 01/07/16 [phone]

## 2016-01-04 ENCOUNTER — Ambulatory Visit: Payer: BLUE CROSS/BLUE SHIELD | Admitting: Primary Care

## 2016-01-06 LAB — CULTURE, GROUP A STREP (THRC)

## 2016-01-07 ENCOUNTER — Telehealth: Payer: Self-pay

## 2016-01-07 NOTE — Progress Notes (Signed)
ED Antimicrobial Stewardship Positive Culture Follow Up   Suzanne [name] is an 30 y.o. female who presented to [facility] on 01/03/2016 with a chief complaint of  Chief Complaint  Patient presents with  . Generalized Body Aches  . Cough  . Sore Throat    Recent Results (from the past 720 hour(s))  Rapid strep screen (not at [facility])     Status: None   Collection Time: 01/03/16  8:34 AM  Result Value Ref Range Status   Streptococcus, Group A Screen (Direct) NEGATIVE NEGATIVE Final    Comment: (NOTE) A Rapid Antigen test may result negative if the antigen level in the sample is below the detection level of this test. The FDA has not cleared this test as a stand-alone test therefore the rapid antigen negative result has reflexed to a Group A Strep culture.   Culture, group A strep     Status: None   Collection Time: 01/03/16  8:34 AM  Result Value Ref Range Status   Specimen Description THROAT  Final   Special Requests NONE Reflexed from [id number]  Final   Culture FEW GROUP A STREP (S.PYOGENES) ISOLATED  Final   Report Status 01/06/2016 FINAL  Final    [x]  Patient discharged originally without antimicrobial agent and treatment is now indicated  New antibiotic prescription: Amoxicillin 1,000 mg daily x 7 days  ED Provider: Harolyn Rutherford, PA-C   [name] 01/07/2016, 9:52 AM PharmD Candidate

## 2016-01-07 NOTE — Telephone Encounter (Signed)
Post ED Visit - Positive Culture Follow-up: Unsuccessful Patient Follow-up  Culture assessed and recommendations reviewed by: []  Enzo Bi, Pharm.D. []  Celedonio Miyamoto, Pharm.D., BCPS []  Garvin Fila, Pharm.D. []  Georgina Pillion, Pharm.D., BCPS []  [place], [address].D., BCPS, AAHIVP []  Estella Husk, Pharm.D., BCPS, AAHIVP []  Tennis Must, Pharm.D. []  Sherle Poe, [address].D. Gennaro Africa Pharm D Positive Throat culture  [x]  Patient discharged without antimicrobial prescription and treatment is now indicated []  Organism is resistant to prescribed ED discharge antimicrobial []  Patient with positive blood cultures   Unable to contact patient after 3 attempts, letter will be sent to address on file  Jerry Caras 01/07/2016, 1:17 PM

## 2016-01-14 ENCOUNTER — Ambulatory Visit (INDEPENDENT_AMBULATORY_CARE_PROVIDER_SITE_OTHER): Payer: BLUE CROSS/BLUE SHIELD | Admitting: Primary Care

## 2016-01-14 ENCOUNTER — Ambulatory Visit: Payer: 59 | Admitting: Primary Care

## 2016-01-14 ENCOUNTER — Encounter: Payer: Self-pay | Admitting: Primary Care

## 2016-01-14 VITALS — BP 128/88 | HR 94 | Temp 98.1°F | Ht 59.0 in | Wt 237.8 lb

## 2016-01-14 DIAGNOSIS — Z3042 Encounter for surveillance of injectable contraceptive: Secondary | ICD-10-CM

## 2016-01-14 DIAGNOSIS — Z6841 Body Mass Index (BMI) 40.0 and over, adult: Secondary | ICD-10-CM

## 2016-01-14 DIAGNOSIS — Z3049 Encounter for surveillance of other contraceptives: Secondary | ICD-10-CM | POA: Diagnosis not present

## 2016-01-14 DIAGNOSIS — E66813 Obesity, class 3: Secondary | ICD-10-CM | POA: Insufficient documentation

## 2016-01-14 DIAGNOSIS — R05 Cough: Secondary | ICD-10-CM | POA: Diagnosis not present

## 2016-01-14 DIAGNOSIS — I10 Essential (primary) hypertension: Secondary | ICD-10-CM

## 2016-01-14 DIAGNOSIS — R059 Cough, unspecified: Secondary | ICD-10-CM

## 2016-01-14 MED ORDER — BENZONATATE 200 MG PO CAPS: 200.0000 mg | ORAL_CAPSULE | Freq: Three times a day (TID) | ORAL | Status: DC | PRN

## 2016-01-14 MED ORDER — MEDROXYPROGESTERONE ACETATE 150 MG/ML IM SUSP
150.0000 mg | Freq: Once | INTRAMUSCULAR | Status: AC
  Administered 2016-01-14: 150 mg via INTRAMUSCULAR

## 2016-01-14 MED ORDER — HYDROCOD POLST-CPM POLST ER 10-8 MG/5ML PO SUER: 5.0000 mL | Freq: Two times a day (BID) | ORAL | Status: DC | PRN

## 2016-01-14 NOTE — Assessment & Plan Note (Signed)
11 pound weight gain since October 2016. Works at [employer], also on Depo Provera. Discussed the importance of a healthy diet and regular exercise in order for weight loss and to reduce risk of other medical diseases. Referral to nutritionist provided as she does not wish to stop Depo Provera. Will continue to monitor.

## 2016-01-14 NOTE — Addendum Note (Signed)
Addended by: Tawnya Crook on: 01/14/2016 09:48 AM   Modules accepted: Orders, SmartSet

## 2016-01-14 NOTE — Assessment & Plan Note (Signed)
Stable on Amlodipine, headaches have resolved. Continue same.

## 2016-01-14 NOTE — Progress Notes (Signed)
Pre visit review using our clinic review tool, if applicable. No additional management support is needed unless otherwise documented below in the visit note. 

## 2016-01-14 NOTE — Patient Instructions (Addendum)
Continue the Amlodipine for high blood pressure.  You will be contacted regarding your referral to the Nutritionist.  Please let us know if you have not heard back within one week.   You may take Benzonatate capsules for cough. Take 1 capsule by mouth three times daily as needed for cough.  You may take the Tussionex cough suppressant at bedtime as needed for cough and rest. Caution this medication contains codeine and will make you feel drowsy.  Please notify me if you develop persistent fevers of 101, start coughing up green mucous, notice increased fatigue or weakness, or feel worse.  Increase consumption of water intake and rest.  You are due for your annual physical, please schedule at your convenience.  It was a pleasure to see you today!  Calorie Counting for Weight Loss Calories are energy you get from the things you eat and drink. Your body uses this energy to keep you going throughout the day. The number of calories you eat affects your weight. When you eat more calories than your body needs, your body stores the extra calories as fat. When you eat fewer calories than your body needs, your body burns fat to get the energy it needs. Calorie counting means keeping track of how many calories you eat and drink each day. If you make sure to eat fewer calories than your body needs, you should lose weight. In order for calorie counting to work, you will need to eat the number of calories that are right for you in a day to lose a healthy amount of weight per week. A healthy amount of weight to lose per week is usually 1-2 lb (0.5-0.9 kg). A dietitian can determine how many calories you need in a day and give you suggestions on how to reach your calorie goal.  WHAT IS MY MY PLAN? My goal is to have __________ calories per day.  If I have this many calories per day, I should lose around __________ pounds per week. WHAT DO I NEED TO KNOW ABOUT CALORIE COUNTING? In order to meet your daily  calorie goal, you will need to:  Find out how many calories are in each food you would like to eat. Try to do this before you eat.  Decide how much of the food you can eat.  Write down what you ate and how many calories it had. Doing this is called keeping a food log. WHERE DO I FIND CALORIE INFORMATION? The number of calories in a food can be found on a Nutrition Facts label. Note that all the information on a label is based on a specific serving of the food. If a food does not have a Nutrition Facts label, try to look up the calories online or ask your dietitian for help. HOW DO I DECIDE HOW MUCH TO EAT? To decide how much of the food you can eat, you will need to consider both the number of calories in one serving and the size of one serving. This information can be found on the Nutrition Facts label. If a food does not have a Nutrition Facts label, look up the information online or ask your dietitian for help. Remember that calories are listed per serving. If you choose to have more than one serving of a food, you will have to multiply the calories per serving by the amount of servings you plan to eat. For example, the label on a package of bread might say that a serving size is 1  slice and that there are 90 calories in a serving. If you eat 1 slice, you will have eaten 90 calories. If you eat 2 slices, you will have eaten 180 calories. HOW DO I KEEP A FOOD LOG? After each meal, record the following information in your food log:  What you ate.  How much of it you ate.  How many calories it had.  Then, add up your calories. Keep your food log near you, such as in a small notebook in your pocket. Another option is to use a mobile app or website. Some programs will calculate calories for you and show you how many calories you have left each time you add an item to the log. WHAT ARE SOME CALORIE COUNTING TIPS?  Use your calories on foods and drinks that will fill you up and not leave you  hungry. Some examples of this include foods like nuts and nut butters, vegetables, lean proteins, and high-fiber foods (more than 5 g fiber per serving).  Eat nutritious foods and avoid empty calories. Empty calories are calories you get from foods or beverages that do not have many nutrients, such as candy and soda. It is better to have a nutritious high-calorie food (such as an avocado) than a food with few nutrients (such as a bag of chips).  Know how many calories are in the foods you eat most often. This way, you do not have to look up how many calories they have each time you eat them.  Look out for foods that may seem like low-calorie foods but are really high-calorie foods, such as baked goods, soda, and fat-free candy.  Pay attention to calories in drinks. Drinks such as sodas, specialty coffee drinks, alcohol, and juices have a lot of calories yet do not fill you up. Choose low-calorie drinks like water and diet drinks.  Focus your calorie counting efforts on higher calorie items. Logging the calories in a garden salad that contains only vegetables is less important than calculating the calories in a milk shake.  Find a way of tracking calories that works for you. Get creative. Most people who are successful find ways to keep track of how much they eat in a day, even if they do not count every calorie. WHAT ARE SOME PORTION CONTROL TIPS?  Know how many calories are in a serving. This will help you know how many servings of a certain food you can have.  Use a measuring cup to measure serving sizes. This is helpful when you start out. With time, you will be able to estimate serving sizes for some foods.  Take some time to put servings of different foods on your favorite plates, bowls, and cups so you know what a serving looks like.  Try not to eat straight from a bag or box. Doing this can lead to overeating. Put the amount you would like to eat in a cup or on a plate to make sure you  are eating the right portion.  Use smaller plates, glasses, and bowls to prevent overeating. This is a quick and easy way to practice portion control. If your plate is smaller, less food can fit on it.  Try not to multitask while eating, such as watching TV or using your computer. If it is time to eat, sit down at a table and enjoy your food. Doing this will help you to start recognizing when you are full. It will also make you more aware of what and how  much you are eating. HOW CAN I CALORIE COUNT WHEN EATING OUT?  Ask for smaller portion sizes or child-sized portions.  Consider sharing an entree and sides instead of getting your own entree.  If you get your own entree, eat only half. Ask for a box at the beginning of your meal and put the rest of your entree in it so you are not tempted to eat it.  Look for the calories on the menu. If calories are listed, choose the lower calorie options.  Choose dishes that include vegetables, fruits, whole grains, low-fat dairy products, and lean protein. Focusing on smart food choices from each of the 5 food groups can help you stay on track at restaurants.  Choose items that are boiled, broiled, grilled, or steamed.  Choose water, milk, unsweetened iced tea, or other drinks without added sugars. If you want an alcoholic beverage, choose a lower calorie option. For example, a regular margarita can have up to 700 calories and a glass of wine has around 150.  Stay away from items that are buttered, battered, fried, or served with cream sauce. Items labeled "crispy" are usually fried, unless stated otherwise.  Ask for dressings, sauces, and syrups on the side. These are usually very high in calories, so do not eat much of them.  Watch out for salads. Many people think salads are a healthy option, but this is often not the case. Many salads come with bacon, fried chicken, lots of cheese, fried chips, and dressing. All of these items have a lot of calories.  If you want a salad, choose a garden salad and ask for grilled meats or steak. Ask for the dressing on the side, or ask for olive oil and vinegar or lemon to use as dressing.  Estimate how many servings of a food you are given. For example, a serving of cooked rice is  cup or about the size of half a tennis ball or one cupcake wrapper. Knowing serving sizes will help you be aware of how much food you are eating at restaurants. The list below tells you how big or small some common portion sizes are based on everyday objects.  1 oz--4 stacked dice.  3 oz--1 deck of cards.  1 tsp--1 dice.  1 Tbsp-- a Ping-Pong ball.  2 Tbsp--1 Ping-Pong ball.   cup--1 tennis ball or 1 cupcake wrapper.  1 cup--1 baseball.   This information is not intended to replace advice given to you by your health care provider. Make sure you discuss any questions you have with your health care provider.   Document Released: 09/11/2005 Document Revised: 10/02/2014 Document Reviewed: 07/17/2013 Elsevier Interactive Patient Education [employer]

## 2016-01-14 NOTE — Progress Notes (Signed)
Subjective:    Patient ID: Suzanne [name], female    DOB: Dec 03, 1985, 30 y.o.   MRN: [id number]  HPI  Suzanne [name] is a 30 year old female who presents today for follow up of hypertension and for a chief complaint of cough.  1) Essential Hypertension: Managed on Amlodipine 5 mg that was initiated 1 year ago. BP stable today at 122/88. She has not had her medication today. She endorses compliance to her medication daily. She has not been working on her diet but is motivated to improve. She's noticed a significant improvement in her headaches. Denies chest pain, dizziness.  Wt Readings from Last 3 Encounters:  01/14/16 237 lb 12.8 oz (107.865 kg)  12/10/15 236 lb 1.9 oz (107.103 kg)  07/16/15 226 lb 12.8 oz (102.876 kg)     2) Cough: Presented to the ED on 04/10 with complaints of 5 day history of cough, rhinorrhea, sore throat, congestion. She had an unremarkable work up in the ED including chest xray and negative strep testing. She was treated with supportive measures and encouraged to follow up if no improvement.   Since her ED visit she's continued to cough. Her cough is sometimes productive with whitish sputum. She denies feeling sick, overall she's feeling well. She's taken Zyrtec. Denies fevers, sore throat, sinus pressure, headaches. She doesn't believe that the Benzonatate 100 mg capsules are helping for cough. She has taken some of her mother's Tussionex with improvement at night.  Review of Systems  Constitutional: Negative for fever.  HENT: Positive for congestion and rhinorrhea. Negative for ear pain, sinus pressure and sore throat.   Respiratory: Positive for cough. Negative for shortness of breath and wheezing.   Cardiovascular: Negative for chest pain.  Neurological: Negative for dizziness and headaches.       Past Medical History  Diagnosis Date  . Sickle cell trait (HCC)   . Acid reflux   . Hypertension   . Blood transfusion without reported diagnosis      Social  History   Social History  . Marital Status: Single    Spouse Name: N/A  . Number of Children: N/A  . Years of Education: N/A   Occupational History  . Not on file.   Social History Main Topics  . Smoking status: Never Smoker   . Smokeless tobacco: Never Used  . Alcohol Use: No  . Drug Use: No  . Sexual Activity:    Partners: Male   Other Topics Concern  . Not on file   Social History Narrative   Completed some collage   Works at [employer]   From [place]   Has one son, 1 years.   Enjoys playing with her son, sleeping.   Family is established here as well.    Past Surgical History  Procedure Laterality Date  . Cesarean section N/A 10/08/2013    Procedure: CESAREAN SECTION;  Surgeon: Antionette Char, MD;  Location: [facility];  Service: Obstetrics;  Laterality: N/A;    Family History  Problem Relation Age of Onset  . Diabetes Mother   . Arthritis Mother   . Hypertension Mother   . Diabetes Maternal Grandmother   . Hypertension Maternal Grandmother   . Cancer Neg Hx   . Heart disease Neg Hx     Allergies  Allergen Reactions  . Hydralazine Itching  . Lasix [Furosemide] Swelling    Mouth and right side of face     Current Outpatient Prescriptions on File Prior to Visit  Medication Sig Dispense Refill  . amLODipine (NORVASC) 5 MG tablet Take 1 tablet (5 mg total) by mouth daily. 90 tablet 2   No current facility-administered medications on file prior to visit.    BP 128/88 mmHg  Pulse 94  Temp(Src) 98.1 F (36.7 C) (Oral)  Ht 4\' 11"  (1.499 m)  Wt 237 lb 12.8 oz (107.865 kg)  BMI 48.00 kg/m2  SpO2 98%    Objective:   Physical Exam  Constitutional: She appears well-nourished.  HENT:  Right Ear: Tympanic membrane and ear canal normal.  Left Ear: Tympanic membrane and ear canal normal.  Nose: Right sinus exhibits no maxillary sinus tenderness and no frontal sinus tenderness. Left sinus exhibits no maxillary sinus tenderness and no frontal sinus  tenderness.  Mouth/Throat: Oropharynx is clear and moist.  Eyes: Conjunctivae are normal.  Neck: Neck supple.  Cardiovascular: Normal rate and regular rhythm.   Pulmonary/Chest: Effort normal and breath sounds normal. She has no wheezes. She has no rales.  Lymphadenopathy:    She has no cervical adenopathy.  Skin: Skin is warm and dry.          Assessment & Plan:  URI:  Cough x several weeks, feeling well overall, just bothersome cough. Exam today with clear lungs and unremarkable HENT exam. Suspect viral vs allergy involvement at this time as she's feeling well and has a normal exam. Will provide 200 mg tessalon pearls and Tussionex HS. Start Zyrtec, flonase. Discussed return precautions, increase water. Follow up PRN.

## 2016-02-17 ENCOUNTER — Encounter: Payer: BLUE CROSS/BLUE SHIELD | Admitting: Primary Care

## 2016-03-30 ENCOUNTER — Encounter: Payer: Self-pay | Admitting: Primary Care

## 2016-03-30 ENCOUNTER — Ambulatory Visit (INDEPENDENT_AMBULATORY_CARE_PROVIDER_SITE_OTHER): Payer: BLUE CROSS/BLUE SHIELD | Admitting: Primary Care

## 2016-03-30 VITALS — BP 144/84 | HR 83 | Temp 98.4°F | Ht 59.0 in | Wt 238.8 lb

## 2016-03-30 DIAGNOSIS — Z3049 Encounter for surveillance of other contraceptives: Secondary | ICD-10-CM

## 2016-03-30 DIAGNOSIS — Z3042 Encounter for surveillance of injectable contraceptive: Secondary | ICD-10-CM

## 2016-03-30 DIAGNOSIS — Z6841 Body Mass Index (BMI) 40.0 and over, adult: Secondary | ICD-10-CM

## 2016-03-30 DIAGNOSIS — Z0000 Encounter for general adult medical examination without abnormal findings: Secondary | ICD-10-CM

## 2016-03-30 DIAGNOSIS — I10 Essential (primary) hypertension: Secondary | ICD-10-CM

## 2016-03-30 DIAGNOSIS — K219 Gastro-esophageal reflux disease without esophagitis: Secondary | ICD-10-CM | POA: Insufficient documentation

## 2016-03-30 LAB — COMPREHENSIVE METABOLIC PANEL
ALT: 15 U/L (ref 0–35)
AST: 15 U/L (ref 0–37)
Albumin: 3.6 g/dL (ref 3.5–5.2)
Alkaline Phosphatase: 71 U/L (ref 39–117)
BUN: 10 mg/dL (ref 6–23)
CO2: 27 mEq/L (ref 19–32)
Calcium: 9.6 mg/dL (ref 8.4–10.5)
Chloride: 107 mEq/L (ref 96–112)
Creatinine, Ser: 0.74 mg/dL (ref 0.40–1.20)
GFR: 120.47 mL/min (ref >60.00)
Glucose, Bld: 99 mg/dL (ref 70–99)
Potassium: 4.3 mEq/L (ref 3.5–5.1)
Sodium: 138 mEq/L (ref 135–145)
Total Bilirubin: 0.3 mg/dL (ref 0.2–1.2)
Total Protein: 7.2 g/dL (ref 6.0–8.3)

## 2016-03-30 LAB — LIPID PANEL
Cholesterol: 176 mg/dL (ref 0–200)
HDL: 38.1 mg/dL — ABNORMAL LOW (ref >39.00)
LDL Cholesterol: 119 mg/dL — ABNORMAL HIGH (ref 0–99)
NonHDL: 138.21
Total CHOL/HDL Ratio: 5
Triglycerides: 96 mg/dL (ref 0.0–149.0)
VLDL: 19.2 mg/dL (ref 0.0–40.0)

## 2016-03-30 LAB — TSH: TSH: 2.31 u[IU]/mL (ref 0.35–4.50)

## 2016-03-30 LAB — HEMOGLOBIN A1C: Hgb A1c MFr Bld: 5.5 % (ref 4.6–6.5)

## 2016-03-30 MED ORDER — HYDROCHLOROTHIAZIDE 12.5 MG PO CAPS: 12.5000 mg | ORAL_CAPSULE | Freq: Every day | ORAL | Status: DC

## 2016-03-30 MED ORDER — MEDROXYPROGESTERONE ACETATE 150 MG/ML IM SUSP
150.0000 mg | Freq: Once | INTRAMUSCULAR | Status: AC
  Administered 2016-03-30: 150 mg via INTRAMUSCULAR

## 2016-03-30 MED ORDER — RANITIDINE HCL 150 MG PO TABS: 150.0000 mg | ORAL_TABLET | Freq: Two times a day (BID) | ORAL | Status: DC

## 2016-03-30 NOTE — Assessment & Plan Note (Signed)
Likely diet and weight induced. Prescription for Zantac 150 milligrams once to twice daily sent to pharmacy. She will notify me if no improvement.

## 2016-03-30 NOTE — Assessment & Plan Note (Signed)
10 pound weight gain from October 2016 to April 2017. Discussed my concern about this as Depo is likely contributing. She will work on improvements in diet and exercise for now as she requests to continue Depo for now. Discussed that if she continued to gain weight we will need to switch to another form of birth control.

## 2016-03-30 NOTE — Patient Instructions (Signed)
Complete lab work prior to leaving today. I will notify you of your results once received.   It is important that you improve your diet. Please limit carbohydrates in the form of white bread, rice, pasta, cakes, fast food, fried food, sugary drinks, etc. Increase your consumption of fresh fruits and vegetables, lean protein, whole grains.  Ensure you are consuming 64 ounces of water daily.  Start exercising. You should be getting 1 hour of moderate intensity exercise 5 days weekly.  Stop Amlodipine tablets as it is likely contributing to your swelling. Start Hydrochlorothiazide tablets for blood pressure and swelling. Take 1 capsule by mouth everyday.  Please monitor your blood pressure if possible. I will call you in a few weeks for your readings.  You are due for your pap, so please schedule this with either myself or a GYN before the end of the year.  Follow up in 6 months for re-evaluation of your blood pressure.  It was a pleasure to see you today!  sDASH Eating Plan DASH stands for "Dietary Approaches to Stop Hypertension." The DASH eating plan is a healthy eating plan that has been shown to reduce high blood pressure (hypertension). Additional health benefits may include reducing the risk of type 2 diabetes mellitus, heart disease, and stroke. The DASH eating plan may also help with weight loss. WHAT DO I NEED TO KNOW ABOUT THE DASH EATING PLAN? For the DASH eating plan, you will follow these general guidelines:  Choose foods with a percent daily value for sodium of less than 5% (as listed on the food label).  Use salt-free seasonings or herbs instead of table salt or sea salt.  Check with your health care provider or pharmacist before using salt substitutes.  Eat lower-sodium products, often labeled as "lower sodium" or "no salt added."  Eat fresh foods.  Eat more vegetables, fruits, and low-fat dairy products.  Choose whole grains. Look for the word "whole" as the first  word in the ingredient list.  Choose fish and skinless chicken or [place] more often than red meat. Limit fish, poultry, and meat to 6 oz (170 g) each day.  Limit sweets, desserts, sugars, and sugary drinks.  Choose heart-healthy fats.  Limit cheese to 1 oz (28 g) per day.  Eat more home-cooked food and less restaurant, buffet, and fast food.  Limit fried foods.  Pavlov foods using methods other than frying.  Limit canned vegetables. If you do use them, rinse them well to decrease the sodium.  When eating at a restaurant, ask that your food be prepared with less salt, or no salt if possible. WHAT FOODS CAN I EAT? Seek help from a dietitian for individual calorie needs. Grains Whole grain or whole wheat bread. Brown rice. Whole grain or whole wheat pasta. Quinoa, bulgur, and whole grain cereals. Low-sodium cereals. Corn or whole wheat flour tortillas. Whole grain cornbread. Whole grain crackers. Low-sodium crackers. Vegetables Fresh or frozen vegetables (raw, steamed, roasted, or grilled). Low-sodium or reduced-sodium tomato and vegetable juices. Low-sodium or reduced-sodium tomato sauce and paste. Low-sodium or reduced-sodium canned vegetables.  Fruits All fresh, canned (in natural juice), or frozen fruits. Meat and Other Protein Products Ground beef (85% or leaner), grass-fed beef, or beef trimmed of fat. Skinless chicken or [place]. Ground chicken or [place]. Pork trimmed of fat. All fish and seafood. Eggs. Dried beans, peas, or lentils. Unsalted nuts and seeds. Unsalted canned beans. Dairy Low-fat dairy products, such as skim or 1% milk, 2% or reduced-fat cheeses,  low-fat ricotta or cottage cheese, or plain low-fat yogurt. Low-sodium or reduced-sodium cheeses. Fats and Oils Tub margarines without trans fats. Light or reduced-fat mayonnaise and salad dressings (reduced sodium). Avocado. Safflower, olive, or canola oils. Natural peanut or almond butter. Other Unsalted popcorn and  pretzels. The items listed above may not be a complete list of recommended foods or beverages. Contact your dietitian for more options. WHAT FOODS ARE NOT RECOMMENDED? Grains White bread. White pasta. White rice. Refined cornbread. Bagels and croissants. Crackers that contain trans fat. Vegetables Creamed or fried vegetables. Vegetables in a cheese sauce. Regular canned vegetables. Regular canned tomato sauce and paste. Regular tomato and vegetable juices. Fruits Dried fruits. Canned fruit in light or heavy syrup. Fruit juice. Meat and Other Protein Products Fatty cuts of meat. Ribs, chicken wings, bacon, sausage, bologna, salami, chitterlings, fatback, hot dogs, bratwurst, and packaged luncheon meats. Salted nuts and seeds. Canned beans with salt. Dairy Whole or 2% milk, cream, half-and-half, and cream cheese. Whole-fat or sweetened yogurt. Full-fat cheeses or blue cheese. Nondairy creamers and whipped toppings. Processed cheese, cheese spreads, or cheese curds. Condiments Onion and garlic salt, seasoned salt, table salt, and sea salt. Canned and packaged gravies. Worcestershire sauce. Tartar sauce. Barbecue sauce. Teriyaki sauce. Soy sauce, including reduced sodium. Steak sauce. Fish sauce. Oyster sauce. Cocktail sauce. Horseradish. Ketchup and mustard. Meat flavorings and tenderizers. Bouillon cubes. Hot sauce. Tabasco sauce. Marinades. Taco seasonings. Relishes. Fats and Oils Butter, stick margarine, lard, shortening, ghee, and bacon fat. Coconut, palm kernel, or palm oils. Regular salad dressings. Other Pickles and olives. Salted popcorn and pretzels. The items listed above may not be a complete list of foods and beverages to avoid. Contact your dietitian for more information. WHERE CAN I FIND MORE INFORMATION? National Heart, Lung, and Blood Institute: [employer]   This information is not intended to replace advice given to you by your health care  provider. Make sure you discuss any questions you have with your health care provider.   Document Released: 08/31/2011 Document Revised: 10/02/2014 Document Reviewed: 07/16/2013 Elsevier Interactive Patient Education [employer]

## 2016-03-30 NOTE — Progress Notes (Signed)
Subjective:    Patient ID: Suzanne [name], female    DOB: 1985/10/24, 30 y.o.   MRN: [id number]  HPI  Suzanne [name] is a 30 year old female who presents today for complete physical.  Immunizations: -Tetanus: Completed in 2015 -Influenza: Did not receive last season   Diet: She endorses a poor diet. Breakfast: Pancakes, eggs, sausage at work Lunch: Skips, sometimes fried chicken sandwich Dinner: Pasta, meat, vegetables Snacks: Snack cakes Desserts: Every night after dinner Beverages: Water, soda  Exercise: She does not currently exercise Eye exam: Completed several years ago. Denies changes in vision. Dental exam: Has not completed in years.  Pap Smear: Due in 2017. Last completed in 2014.   Wt Readings from Last 3 Encounters:  01/14/16 237 lb 12.8 oz (107.865 kg)  12/10/15 236 lb 1.9 oz (107.103 kg)  07/16/15 226 lb 12.8 oz (102.876 kg)      Review of Systems  Constitutional: Negative for unexpected weight change.  HENT: Negative for rhinorrhea.   Respiratory: Negative for cough and shortness of breath.   Cardiovascular: Positive for leg swelling. Negative for chest pain.       Does experience chest pain, which is chronic, when stressed  Gastrointestinal: Negative for diarrhea and constipation.       Experiences esophageal burning, belching that has been present for the past several years. She's taking Tums on a regular basis with improvement.   Genitourinary: Negative for difficulty urinating and menstrual problem.  Musculoskeletal: Negative for myalgias and arthralgias.  Skin: Negative for rash.  Allergic/Immunologic: Negative for environmental allergies.  Neurological: Negative for dizziness, numbness and headaches.  Psychiatric/Behavioral:       Denies concerns for anxiety or depression       Past Medical History  Diagnosis Date  . Sickle cell trait (HCC)   . Acid reflux   . Hypertension   . Blood transfusion without reported diagnosis      Social History     Social History  . Marital Status: Single    Spouse Name: N/A  . Number of Children: N/A  . Years of Education: N/A   Occupational History  . Not on file.   Social History Main Topics  . Smoking status: Never Smoker   . Smokeless tobacco: Never Used  . Alcohol Use: No  . Drug Use: No  . Sexual Activity:    Partners: Male   Other Topics Concern  . Not on file   Social History Narrative   Completed some collage   Works at [employer]   From [place]   Has one son, 1 years.   Enjoys playing with her son, sleeping.   Family is established here as well.    Past Surgical History  Procedure Laterality Date  . Cesarean section N/A 10/08/2013    Procedure: CESAREAN SECTION;  Surgeon: Antionette Char, MD;  Location: [facility];  Service: Obstetrics;  Laterality: N/A;    Family History  Problem Relation Age of Onset  . Diabetes Mother   . Arthritis Mother   . Hypertension Mother   . Diabetes Maternal Grandmother   . Hypertension Maternal Grandmother   . Cancer Neg Hx   . Heart disease Neg Hx     Allergies  Allergen Reactions  . Hydralazine Itching  . Lasix [Furosemide] Swelling    Mouth and right side of face     No current outpatient prescriptions on file prior to visit.   No current facility-administered medications on file prior to visit.  There were no vitals taken for this visit.    Objective:   Physical Exam  Constitutional: She is oriented to person, place, and time. She appears well-nourished.  HENT:  Right Ear: Tympanic membrane and ear canal normal.  Left Ear: Tympanic membrane and ear canal normal.  Nose: Nose normal.  Mouth/Throat: Oropharynx is clear and moist.  Eyes: Conjunctivae and EOM are normal. Pupils are equal, round, and reactive to light.  Neck: Neck supple. No thyromegaly present.  Cardiovascular: Normal rate and regular rhythm.   No murmur heard. Pulmonary/Chest: Effort normal and breath sounds normal. She has no rales.   Abdominal: Soft. Bowel sounds are normal. There is no tenderness.  Musculoskeletal: Normal range of motion.  Lymphadenopathy:    She has no cervical adenopathy.  Neurological: She is alert and oriented to person, place, and time. She has normal reflexes. No cranial nerve deficit.  Skin: Skin is warm and dry. No rash noted.  Psychiatric: She has a normal mood and affect.          Assessment & Plan:

## 2016-03-30 NOTE — Assessment & Plan Note (Signed)
Weight gain of 11 pounds from October 2016 to April 2017. Suspect Depo Provera May be contributing, however, also eats a very poor diet. Long discussion today regarding the importance of weight loss through healthy diet and exercise.

## 2016-03-30 NOTE — Assessment & Plan Note (Signed)
Immunizations up-to-date. Pap due, she would like to defer to another visit and will schedule at her convenience. Exam unremarkable. Labs pending today. Discussed the importance of a healthy diet and regular exercise in order for weight loss and to reduce risk of other medical diseases. Recommendations provided regarding changes in her diet. Follow-up in 6 months for reevaluation.

## 2016-03-30 NOTE — Assessment & Plan Note (Signed)
Lower ankle swelling bilaterally and has stopped amlodipine. Some improvement since removal of amlodipine. Will switch to HCTZ 12.5 mg as amlodipine is likely the cause of her swelling. Also provided with her with information regarding DASH diet and recommendations for changes in her diet.

## 2016-03-30 NOTE — Addendum Note (Signed)
Addended by: Tawnya Crook on: 03/30/2016 01:28 PM   Modules accepted: Kipp Brood

## 2016-04-08 ENCOUNTER — Telehealth: Payer: Self-pay | Admitting: *Deleted

## 2016-04-08 NOTE — Telephone Encounter (Signed)
No response to phone call or letter sent regarding (+)strep culture.  Unable to call in Amoxicillin 1000mg ,PO QD X 7 days, Shaun Joy PA-C

## 2016-06-27 ENCOUNTER — Ambulatory Visit (INDEPENDENT_AMBULATORY_CARE_PROVIDER_SITE_OTHER): Payer: Self-pay | Admitting: Primary Care

## 2016-06-27 ENCOUNTER — Encounter: Payer: Self-pay | Admitting: Primary Care

## 2016-06-27 ENCOUNTER — Other Ambulatory Visit (HOSPITAL_COMMUNITY)
Admission: RE | Admit: 2016-06-27 | Discharge: 2016-06-27 | Disposition: A | Discharge status: Home / Self Care | Payer: Self-pay | Source: Ambulatory Visit | Attending: Internal Medicine | Admitting: Internal Medicine

## 2016-06-27 ENCOUNTER — Ambulatory Visit: Payer: Self-pay

## 2016-06-27 ENCOUNTER — Telehealth: Payer: Self-pay | Admitting: Primary Care

## 2016-06-27 ENCOUNTER — Other Ambulatory Visit (HOSPITAL_COMMUNITY)
Admission: RE | Admit: 2016-06-27 | Discharge: 2016-06-27 | Disposition: A | Discharge status: Home / Self Care | Payer: Self-pay | Source: Ambulatory Visit | Attending: Primary Care | Admitting: Primary Care

## 2016-06-27 VITALS — BP 136/84 | HR 79 | Temp 98.9°F | Ht 59.0 in | Wt 239.8 lb

## 2016-06-27 DIAGNOSIS — Z3042 Encounter for surveillance of injectable contraceptive: Secondary | ICD-10-CM

## 2016-06-27 DIAGNOSIS — I10 Essential (primary) hypertension: Secondary | ICD-10-CM

## 2016-06-27 DIAGNOSIS — Z0000 Encounter for general adult medical examination without abnormal findings: Secondary | ICD-10-CM

## 2016-06-27 DIAGNOSIS — R87810 Cervical high risk human papillomavirus (HPV) DNA test positive: Secondary | ICD-10-CM | POA: Insufficient documentation

## 2016-06-27 DIAGNOSIS — Z124 Encounter for screening for malignant neoplasm of cervix: Secondary | ICD-10-CM

## 2016-06-27 DIAGNOSIS — Z01419 Encounter for gynecological examination (general) (routine) without abnormal findings: Secondary | ICD-10-CM | POA: Insufficient documentation

## 2016-06-27 DIAGNOSIS — Z1151 Encounter for screening for human papillomavirus (HPV): Secondary | ICD-10-CM | POA: Insufficient documentation

## 2016-06-27 DIAGNOSIS — Z6841 Body Mass Index (BMI) 40.0 and over, adult: Secondary | ICD-10-CM

## 2016-06-27 MED ORDER — MEDROXYPROGESTERONE ACETATE 150 MG/ML IM SUSP
150.0000 mg | Freq: Once | INTRAMUSCULAR | Status: AC
  Administered 2016-06-27: 150 mg via INTRAMUSCULAR

## 2016-06-27 NOTE — Telephone Encounter (Signed)
Please notify patient that I spoke with the doctor in our office who does the implanted birth control. She does not recommend the implanted birth control as it can cause just as much weight gain as the Depo-Provera injections. IUD would be the best way to go to prevent weight gain. She will need to obtain this through her GYN. Please let me know if she has new questions.

## 2016-06-27 NOTE — Assessment & Plan Note (Signed)
Noncompliant to HCTZ. Long discussion today regarding importance of weight loss through healthy diet and regular exercise. Will have her switch birth control options as Depo-Provera likely contributing to weight gain.

## 2016-06-27 NOTE — Assessment & Plan Note (Signed)
Continued weight gain. Endorses improvements in diet recently. Is not exercising. Discussed the importance of a healthy diet and regular exercise in order for weight loss, and to reduce the risk of other medical diseases.

## 2016-06-27 NOTE — Telephone Encounter (Signed)
Tried to call patient but the phone number is disconnected.

## 2016-06-27 NOTE — Patient Instructions (Signed)
We will notify you of your pap results once received.  Complete monthly self breast exams in the shower as explained.  It's importance to improve your diet by reducing consumption of fast food, fried food, processed snack foods, sugary drinks. Increase consumption of fresh vegetables and fruits, whole grains, water.  Ensure you are drinking 64 ounces of water daily.  Start exercising. You should be getting 150 minutes of moderate intensity exercise weekly.  It was a pleasure to see you today!

## 2016-06-27 NOTE — Assessment & Plan Note (Signed)
Returns today for Pap and breast exam. Exam today unremarkable and Pap results sent off for testing. Discussed importance of monthly self breast exams. If Pap negative, repeat in 3 years.

## 2016-06-27 NOTE — Assessment & Plan Note (Signed)
Persistent weight gain since reinitiation of Depo-Provera injections. Cannot remember to take oral contraceptives. Long discussion regarding options. Implanted devices can cause equal amount of weight gain as Depo-Provera injections. Patient will follow up with GYN for IUD placement.

## 2016-06-27 NOTE — Progress Notes (Signed)
Subjective:    Patient ID: Suzanne [name], female    DOB: 03/09/1986, 30 y.o.   MRN: [id number]  HPI  Suzanne [name] is a 30 year old female who presents today for her pap and breast exam. She denies vaginal discharge, vaginal itching, bleeding, abdominal pain. She does not preform monthly breast exams. She is working towards a healthier diet by eating more salads rather than fried foods. She is currently managed on Depo-Provera injections for birth control and is due today for another injection. She would like to switch from Depo-Provera to another birth control and is interested in either the IUD or implanted device. She has gradually gained weight since reinitiation of Depo-Provera injections and is worried about this.  Wt Readings from Last 3 Encounters:  06/27/16 239 lb 12.8 oz (108.8 kg)  03/30/16 238 lb 12.8 oz (108.3 kg)  01/14/16 237 lb 12.8 oz (107.9 kg)     Review of Systems  Constitutional:       Gradual weight gain  Respiratory: Negative for shortness of breath.   Cardiovascular: Negative for chest pain.  Genitourinary: Negative for menstrual problem, pelvic pain, vaginal bleeding and vaginal discharge.  Neurological: Negative for headaches.       Past Medical History:  Diagnosis Date  . Acid reflux   . Blood transfusion without reported diagnosis   . Hypertension   . Sickle cell trait [facility](HCC)      Social History   Social History  . Marital status: Single    Spouse name: N/A  . Number of children: N/A  . Years of education: N/A   Occupational History  . Not on file.   Social History Main Topics  . Smoking status: Never Smoker  . Smokeless tobacco: Never Used  . Alcohol use No  . Drug use: No  . Sexual activity: Not Currently    Partners: Male   Other Topics Concern  . Not on file   Social History Narrative   Completed some collage   Works at [employer]   [place]   Has one son, 1 years.   Enjoys playing with her son, sleeping.   Family is  established here as well.    Past Surgical History:  Procedure Laterality Date  . CESAREAN SECTION N/A 10/08/2013   Procedure: CESAREAN SECTION;  Surgeon: Antionette CharLisa Jackson-Moore, MD;  Location: [facility];  Service: Obstetrics;  Laterality: N/A;    Family History  Problem Relation Age of Onset  . Diabetes Mother   . Arthritis Mother   . Hypertension Mother   . Diabetes Maternal Grandmother   . Hypertension Maternal Grandmother   . Cancer Neg Hx   . Heart disease Neg Hx     Allergies  Allergen Reactions  . Hydralazine Itching  . Lasix [Furosemide] Swelling    Mouth and right side of face     Current Outpatient Prescriptions on File Prior to Visit  Medication Sig Dispense Refill  . hydrochlorothiazide (MICROZIDE) 12.5 MG capsule Take 1 capsule (12.5 mg total) by mouth daily. (Patient not taking: Reported on 06/27/2016) 90 capsule 1  . ranitidine (ZANTAC) 150 MG tablet Take 1 tablet (150 mg total) by mouth 2 (two) times daily. (Patient not taking: Reported on 06/27/2016) 60 tablet 3   No current facility-administered medications on file prior to visit.     BP 136/84   Pulse 79   Temp 98.9 F (37.2 C) (Oral)   Ht 4\' 11"  (1.499 m)   Wt 239 lb  12.8 oz (108.8 kg)   SpO2 99%   BMI 48.43 kg/m    Objective:   Physical Exam  Constitutional: She appears well-nourished.  Cardiovascular: Normal rate and regular rhythm.   Pulmonary/Chest: Effort normal and breath sounds normal. Right breast exhibits no mass, no nipple discharge, no skin change and no tenderness. Left breast exhibits no mass, no nipple discharge, no skin change and no tenderness.  Genitourinary: There is no lesion on the right labia. There is no lesion on the left labia. Cervix exhibits no motion tenderness and no discharge. Right adnexum displays no tenderness. Left adnexum displays no tenderness. No erythema in the vagina. No vaginal discharge found.          Assessment & Plan:

## 2016-06-27 NOTE — Progress Notes (Signed)
Pre visit review using our clinic review tool, if applicable. No additional management support is needed unless otherwise documented below in the visit note. 

## 2016-06-29 LAB — CYTOLOGY - PAP

## 2016-06-30 NOTE — Telephone Encounter (Signed)
Message left for patient to return my call.  

## 2016-06-30 NOTE — Telephone Encounter (Signed)
Spoken and notified patient of Kate's comments. Patient verbalized understanding. 

## 2016-06-30 NOTE — Telephone Encounter (Signed)
Patient returned Chan's call.  Patient is at work, so if she doesn't anwer,she asked for Johny Drilling to leave a detailed message at [phone].

## 2016-10-02 ENCOUNTER — Encounter: Payer: Self-pay | Admitting: Primary Care

## 2016-10-02 ENCOUNTER — Ambulatory Visit (INDEPENDENT_AMBULATORY_CARE_PROVIDER_SITE_OTHER): Payer: BLUE CROSS/BLUE SHIELD | Admitting: Primary Care

## 2016-10-02 VITALS — BP 144/86 | HR 88 | Temp 98.2°F | Ht 59.0 in | Wt 239.0 lb

## 2016-10-02 DIAGNOSIS — Z3009 Encounter for other general counseling and advice on contraception: Secondary | ICD-10-CM

## 2016-10-02 DIAGNOSIS — I10 Essential (primary) hypertension: Secondary | ICD-10-CM

## 2016-10-02 DIAGNOSIS — Z6841 Body Mass Index (BMI) 40.0 and over, adult: Secondary | ICD-10-CM

## 2016-10-02 DIAGNOSIS — Z3042 Encounter for surveillance of injectable contraceptive: Secondary | ICD-10-CM

## 2016-10-02 MED ORDER — HYDROCHLOROTHIAZIDE 12.5 MG PO CAPS: 12.5000 mg | ORAL_CAPSULE | Freq: Every day | ORAL | 2 refills | Status: DC

## 2016-10-02 MED ORDER — MEDROXYPROGESTERONE ACETATE 150 MG/ML IM SUSY
150.0000 mg | PREFILLED_SYRINGE | Freq: Once | INTRAMUSCULAR | Status: AC
Start: 2016-10-02 — End: 2016-10-02
  Administered 2016-10-02: 150 mg via INTRAMUSCULAR

## 2016-10-02 NOTE — Progress Notes (Signed)
Subjective:    Patient ID: Suzanne [name], female    DOB: 03-30-1986, 31 y.o.   MRN: [id number]  HPI  Suzanne [name] is a 31 year old female who presents today for follow up of hypertension. She is currently managed on HCTZ 12.5 mg. Her BP in the office today is 144/86. Her BP has been either borderline high or above goal over the last several visits. She has a history of non compliance to her medication. She's not been taking her HCTZ regularly and has not had it today.   She struggles with her diet, especially at work as she works at [employer]. She is motivated to lose weight and plans on eating more grilled options. She has increased intake of water and is trying to reduce soda consumption. She denies chest pain, dizziness, visual changes.  2) Contraception: Currently managed on Depo Provera for the last 1 year. Last visit we discussed switching methods for contraception due to weight gain and obesity. She has difficulty remembering to take oral medications and does not believe she would be consistent with OCP's. She declines Nuva Ring. She is interested in the IUD and does not have a GYN. Her last Pap was in October 2017 which was negative for cancerous cells, but positive for HPV. She is due for repeat Pap in October 2018.  Review of Systems  Eyes: Negative for visual disturbance.  Respiratory: Negative for shortness of breath.   Cardiovascular: Negative for chest pain.  Genitourinary: Negative for menstrual problem.  Neurological: Negative for dizziness.       Past Medical History:  Diagnosis Date  . Acid reflux   . Blood transfusion without reported diagnosis   . Hypertension   . Sickle cell trait [facility])      Social History   Social History  . Marital status: Single    Spouse name: Suzanne [name]  . Number of children: Suzanne [name]  . Years of education: Suzanne [name]   Occupational History  . Not on file.   Social History Main Topics  . Smoking status: Never Smoker  . Smokeless tobacco: Never Used    . Alcohol use No  . Drug use: No  . Sexual activity: Not Currently    Partners: Male   Other Topics Concern  . Not on file   Social History Narrative   Completed some collage   Works at [employer]   From [place]   Has one son, 1 years.   Enjoys playing with her son, sleeping.   Family is established here as well.    Past Surgical History:  Procedure Laterality Date  . CESAREAN SECTION Suzanne [name] 10/08/2013   Procedure: CESAREAN SECTION;  Surgeon: Antionette Char, MD;  Location: [facility];  Service: Obstetrics;  Laterality: Suzanne [name];    Family History  Problem Relation Age of Onset  . Diabetes Mother   . Arthritis Mother   . Hypertension Mother   . Diabetes Maternal Grandmother   . Hypertension Maternal Grandmother   . Cancer Neg Hx   . Heart disease Neg Hx     Allergies  Allergen Reactions  . Hydralazine Itching  . Lasix [Furosemide] Swelling    Mouth and right side of face     Current Outpatient Prescriptions on File Prior to Visit  Medication Sig Dispense Refill  . ranitidine (ZANTAC) 150 MG tablet Take 1 tablet (150 mg total) by mouth 2 (two) times daily. (Patient not taking: Reported on 10/02/2016) 60 tablet 3   No current facility-administered medications on  file prior to visit.     BP (!) 144/86   Pulse 88   Temp 98.2 F (36.8 C) (Oral)   Ht 4\' 11"  (1.499 m)   Wt 239 lb (108.4 kg)   SpO2 97%   BMI 48.27 kg/m    Objective:   Physical Exam  Constitutional: She appears well-nourished.  Neck: Neck supple.  Cardiovascular: Normal rate and regular rhythm.   Pulmonary/Chest: Effort normal and breath sounds normal.  Skin: Skin is warm and dry.          Assessment & Plan:

## 2016-10-02 NOTE — Patient Instructions (Addendum)
You will be contacted regarding your referral to GYN.  Please let us know if you have not heard back within one week. Take your Pap results to your new GYN as they will ask about your most recent pap.  You must take your blood pressure medicine everyday. I sent refills to your pharmacy.  Your ECG looks stable, we will continue to monitor this in the future.  Monitor your blood pressure and notify me if you see numbers at or above 140/90.  Continue to work on improving your diet. Limit fast food, fried foods, sugary drinks. Consume more vegetables, grilled lean protein, whole grains, water.  Ensure you are consuming 64 ounces of water daily.  Follow up in 6 months for your annual physical. It was a pleasure to see you today!  DASH Eating Plan DASH stands for "Dietary Approaches to Stop Hypertension." The DASH eating plan is a healthy eating plan that has been shown to reduce high blood pressure (hypertension). Additional health benefits may include reducing the risk of type 2 diabetes mellitus, heart disease, and stroke. The DASH eating plan may also help with weight loss. What do I need to know about the DASH eating plan? For the DASH eating plan, you will follow these general guidelines:  Choose foods with less than 150 milligrams of sodium per serving (as listed on the food label).  Use salt-free seasonings or herbs instead of table salt or sea salt.  Check with your health care provider or pharmacist before using salt substitutes.  Eat lower-sodium products. These are often labeled as "low-sodium" or "no salt added."  Eat fresh foods. Avoid eating a lot of canned foods.  Eat more vegetables, fruits, and low-fat dairy products.  Choose whole grains. Look for the word "whole" as the first word in the ingredient list.  Choose fish and skinless chicken or [place] more often than red meat. Limit fish, poultry, and meat to 6 oz (170 g) each day.  Limit sweets, desserts, sugars, and  sugary drinks.  Choose heart-healthy fats.  Eat more home-cooked food and less restaurant, buffet, and fast food.  Limit fried foods.  Do not fry foods. Toothman foods using methods such as baking, boiling, grilling, and broiling instead.  When eating at a restaurant, ask that your food be prepared with less salt, or no salt if possible. What foods can I eat? Seek help from a dietitian for individual calorie needs. Grains  Whole grain or whole wheat bread. Brown rice. Whole grain or whole wheat pasta. Quinoa, bulgur, and whole grain cereals. Low-sodium cereals. Corn or whole wheat flour tortillas. Whole grain cornbread. Whole grain crackers. Low-sodium crackers. Vegetables  Fresh or frozen vegetables (raw, steamed, roasted, or grilled). Low-sodium or reduced-sodium tomato and vegetable juices. Low-sodium or reduced-sodium tomato sauce and paste. Low-sodium or reduced-sodium canned vegetables. Fruits  All fresh, canned (in natural juice), or frozen fruits. Meat and Other Protein Products  Ground beef (85% or leaner), grass-fed beef, or beef trimmed of fat. Skinless chicken or [place]. Ground chicken or [place]. Pork trimmed of fat. All fish and seafood. Eggs. Dried beans, peas, or lentils. Unsalted nuts and seeds. Unsalted canned beans. Dairy  Low-fat dairy products, such as skim or 1% milk, 2% or reduced-fat cheeses, low-fat ricotta or cottage cheese, or plain low-fat yogurt. Low-sodium or reduced-sodium cheeses. Fats and Oils  Tub margarines without trans fats. Light or reduced-fat mayonnaise and salad dressings (reduced sodium). Avocado. Safflower, olive, or canola oils. Natural peanut or almond butter.  Other  Unsalted popcorn and pretzels. The items listed above may not be a complete list of recommended foods or beverages. Contact your dietitian for more options.  What foods are not recommended? Grains  White bread. White pasta. White rice. Refined cornbread. Bagels and croissants.  Crackers that contain trans fat. Vegetables  Creamed or fried vegetables. Vegetables in a cheese sauce. Regular canned vegetables. Regular canned tomato sauce and paste. Regular tomato and vegetable juices. Fruits  Canned fruit in light or heavy syrup. Fruit juice. Meat and Other Protein Products  Fatty cuts of meat. Ribs, chicken wings, bacon, sausage, bologna, salami, chitterlings, fatback, hot dogs, bratwurst, and packaged luncheon meats. Salted nuts and seeds. Canned beans with salt. Dairy  Whole or 2% milk, cream, half-and-half, and cream cheese. Whole-fat or sweetened yogurt. Full-fat cheeses or blue cheese. Nondairy creamers and whipped toppings. Processed cheese, cheese spreads, or cheese curds. Condiments  Onion and garlic salt, seasoned salt, table salt, and sea salt. Canned and packaged gravies. Worcestershire sauce. Tartar sauce. Barbecue sauce. Teriyaki sauce. Soy sauce, including reduced sodium. Steak sauce. Fish sauce. Oyster sauce. Cocktail sauce. Horseradish. Ketchup and mustard. Meat flavorings and tenderizers. Bouillon cubes. Hot sauce. Tabasco sauce. Marinades. Taco seasonings. Relishes. Fats and Oils  Butter, stick margarine, lard, shortening, ghee, and bacon fat. Coconut, palm kernel, or palm oils. Regular salad dressings. Other  Pickles and olives. Salted popcorn and pretzels. The items listed above may not be a complete list of foods and beverages to avoid. Contact your dietitian for more information.  Where can I find more information? National Heart, Lung, and Blood Institute: [employer] This information is not intended to replace advice given to you by your health care provider. Make sure you discuss any questions you have with your health care provider. Document Released: 08/31/2011 Document Revised: 02/17/2016 Document Reviewed: 07/16/2013 Elsevier Interactive Patient Education  2017 [employer].

## 2016-10-02 NOTE — Assessment & Plan Note (Signed)
She is agreeable to the IUD. Depo is likely inhibiting weight loss. Not a good candidate for OCP's. Last injection provided today, referral placed to GYN for IUD placement. Repeat Pap due in October 2018.

## 2016-10-02 NOTE — Progress Notes (Signed)
Pre visit review using our clinic review tool, if applicable. No additional management support is needed unless otherwise documented below in the visit note. 

## 2016-10-02 NOTE — Assessment & Plan Note (Addendum)
Slightly above goal in office today, has not had medication today and has not been taking regularly. Stressed the importance of compliance to her medication. Strongly encouraged her to lose weight through healthy diet and exercise. Information regarding DASH diet provided.  ECG today with T-wave inversion, not significantly different from last, will continue to monitor, repeat in 6 months. Will continue to monitor.

## 2016-10-02 NOTE — Assessment & Plan Note (Signed)
Discussed the importance of a healthy diet and regular exercise in order for weight loss, and to reduce the risk of other medical diseases. Stressed the importance that she must improve. She will be coming off of Depo soon which could very well be contributing to weight gain.

## 2016-10-02 NOTE — Addendum Note (Signed)
Addended by: Tawnya Crook on: 10/02/2016 11:12 AM   Modules accepted: Orders

## 2016-10-04 ENCOUNTER — Ambulatory Visit: Payer: BLUE CROSS/BLUE SHIELD | Admitting: Primary Care

## 2016-11-23 DIAGNOSIS — Z113 Encounter for screening for infections with a predominantly sexual mode of transmission: Secondary | ICD-10-CM | POA: Diagnosis not present

## 2016-11-23 DIAGNOSIS — Z30014 Encounter for initial prescription of intrauterine contraceptive device: Secondary | ICD-10-CM | POA: Diagnosis not present

## 2016-12-18 DIAGNOSIS — Z3009 Encounter for other general counseling and advice on contraception: Secondary | ICD-10-CM | POA: Diagnosis not present

## 2016-12-18 DIAGNOSIS — A562 Chlamydial infection of genitourinary tract, unspecified: Secondary | ICD-10-CM | POA: Diagnosis not present

## 2016-12-18 DIAGNOSIS — A749 Chlamydial infection, unspecified: Secondary | ICD-10-CM | POA: Diagnosis not present

## 2017-02-08 ENCOUNTER — Emergency Department (HOSPITAL_COMMUNITY)
Admission: EM | Admit: 2017-02-08 | Discharge: 2017-02-08 | Disposition: A | Discharge status: Home / Self Care | Payer: BLUE CROSS/BLUE SHIELD | Attending: Emergency Medicine | Admitting: Emergency Medicine

## 2017-02-08 ENCOUNTER — Encounter (HOSPITAL_COMMUNITY): Payer: Self-pay | Admitting: Emergency Medicine

## 2017-02-08 DIAGNOSIS — I5031 Acute diastolic (congestive) heart failure: Secondary | ICD-10-CM | POA: Insufficient documentation

## 2017-02-08 DIAGNOSIS — G44209 Tension-type headache, unspecified, not intractable: Secondary | ICD-10-CM | POA: Diagnosis not present

## 2017-02-08 DIAGNOSIS — I110 Hypertensive heart disease with heart failure: Secondary | ICD-10-CM | POA: Insufficient documentation

## 2017-02-08 DIAGNOSIS — I10 Essential (primary) hypertension: Secondary | ICD-10-CM | POA: Diagnosis not present

## 2017-02-08 LAB — I-STAT CHEM 8, ED
BUN: 12 mg/dL (ref 6–20)
Calcium, Ion: 1.23 mmol/L (ref 1.15–1.40)
Chloride: 104 mmol/L (ref 101–111)
Creatinine, Ser: 0.7 mg/dL (ref 0.44–1.00)
Glucose, Bld: 100 mg/dL — ABNORMAL HIGH (ref 65–99)
HCT: 38 % (ref 36.0–46.0)
Hemoglobin: 12.9 g/dL (ref 12.0–15.0)
Potassium: 4.2 mmol/L (ref 3.5–5.1)
Sodium: 140 mmol/L (ref 135–145)
TCO2: 28 mmol/L (ref 0–100)

## 2017-02-08 LAB — URINALYSIS, ROUTINE W REFLEX MICROSCOPIC
Bilirubin Urine: NEGATIVE
Glucose, UA: NEGATIVE mg/dL
Ketones, ur: NEGATIVE mg/dL
Leukocytes, UA: NEGATIVE
Nitrite: NEGATIVE
Protein, ur: NEGATIVE mg/dL
Specific Gravity, Urine: 1.011 (ref 1.005–1.030)
pH: 5 (ref 5.0–8.0)

## 2017-02-08 LAB — PREGNANCY, URINE: Preg Test, Ur: NEGATIVE

## 2017-02-08 LAB — POC URINE PREG, ED: Preg Test, Ur: NEGATIVE

## 2017-02-08 MED ORDER — HYDROCHLOROTHIAZIDE 12.5 MG PO CAPS: 12.5000 mg | ORAL_CAPSULE | Freq: Every day | ORAL | 0 refills | Status: DC

## 2017-02-08 MED ORDER — IBUPROFEN 800 MG PO TABS
800.0000 mg | ORAL_TABLET | Freq: Once | ORAL | Status: AC
  Administered 2017-02-08: 800 mg via ORAL
  Filled 2017-02-08: qty 1

## 2017-02-08 NOTE — Discharge Instructions (Signed)

## 2017-02-08 NOTE — ED Provider Notes (Signed)
Emergency Department Provider Note   I have reviewed the triage vital signs and the nursing notes.   HISTORY  Chief Complaint Headache and Hypertension   HPI Suzanne [name] is a 31 y.o. female with PMH of SS trait and HTN presents to the emergency department for evaluation of intermittent headache over the last several weeks and elevated blood pressure. The patient has a history of hypertension but is noncompliant with her medications. She states that she is not taken her HCTZ for several months. She states she was trying diet and exercise with no significant change. She notes intermittent anterior and posterior headache with slight radiation down the neck into the shoulders for the past several weeks. The headache became more severe yesterday and patient checked her blood pressure was elevated she became concerned. She took an Aleve and went to bed but woke up with a more mild headache this morning and so presented to the emergency department. She denies any tingling, numbness, gait disturbance. No vision changes. No nausea or vomiting. No neck stiffness.   Past Medical History:  Diagnosis Date  . Acid reflux   . Blood transfusion without reported diagnosis   . Hypertension   . Sickle cell trait [facility])     Patient Active Problem List   Diagnosis Date Noted  . GERD (gastroesophageal reflux disease) 03/30/2016  . Morbid obesity with BMI of 45.0-49.9, adult (HCC) 01/14/2016  . Birth control 04/30/2015  . Preventative health care 01/06/2015  . Essential hypertension 12/17/2014  . Acute diastolic CHF (congestive heart failure) (HCC) 10/18/2013  . Anemia 10/12/2013  . SICKLE CELL TRAIT 11/22/2006    Past Surgical History:  Procedure Laterality Date  . CESAREAN SECTION N/A 10/08/2013   Procedure: CESAREAN SECTION;  Surgeon: Antionette Char, MD;  Location: [facility];  Service: Obstetrics;  Laterality: N/A;    Current Outpatient Rx  . Order #: [id number] Class: Print  . Order #:  [id number] Class: Normal    Allergies Hydralazine and Lasix [furosemide]  Family History  Problem Relation Age of Onset  . Diabetes Mother   . Arthritis Mother   . Hypertension Mother   . Diabetes Maternal Grandmother   . Hypertension Maternal Grandmother   . Cancer Neg Hx   . Heart disease Neg Hx     Social History Social History  Substance Use Topics  . Smoking status: Never Smoker  . Smokeless tobacco: Never Used  . Alcohol use No    Review of Systems  Constitutional: No fever/chills Eyes: No visual changes. ENT: No sore throat. Cardiovascular: Denies chest pain. Respiratory: Denies shortness of breath. Gastrointestinal: No abdominal pain. No nausea, no vomiting. No diarrhea. No constipation. Genitourinary: Negative for dysuria. Musculoskeletal: Negative for back pain. Skin: Negative for rash. Neurological: Negative for focal weakness or numbness. Positive HA.   10-point ROS otherwise negative.  ____________________________________________   PHYSICAL EXAM:  VITAL SIGNS: ED Triage Vitals  Enc Vitals Group     BP 02/08/17 0823 (!) 150/88     Pulse Rate 02/08/17 0823 85     Resp 02/08/17 0823 16     Temp 02/08/17 0823 98.2 F (36.8 C)     Temp Source 02/08/17 0823 Oral     SpO2 02/08/17 0823 100 %     Pain Score 02/08/17 0822 8   Constitutional: Alert and oriented. Well appearing and in no acute distress. Eyes: Conjunctivae are normal. PERRL.  Head: Atraumatic. Nose: No congestion/rhinnorhea. Mouth/Throat: Mucous membranes are moist.  Oropharynx non-erythematous. Neck: No  stridor.  Cardiovascular: Normal rate, regular rhythm. Good peripheral circulation. Grossly normal heart sounds.   Respiratory: Normal respiratory effort.  No retractions. Lungs CTAB. Gastrointestinal: Soft and nontender. No distention.  Musculoskeletal: No lower extremity tenderness nor edema. No gross deformities of extremities. Neurologic:  Normal speech and language. No gross  focal neurologic deficits are appreciated. Positive  Skin:  Skin is warm, dry and intact. No rash noted.   ____________________________________________   LABS (all labs ordered are listed, but only abnormal results are displayed)  Labs Reviewed  URINALYSIS, ROUTINE W REFLEX MICROSCOPIC - Abnormal; Notable for the following:       Result Value   Hgb urine dipstick SMALL (*)    Bacteria, UA MANY (*)    Squamous Epithelial / LPF 0-5 (*)    All other components within normal limits  I-STAT CHEM 8, ED - Abnormal; Notable for the following:    Glucose, Bld 100 (*)    All other components within normal limits  PREGNANCY, URINE  POC URINE PREG, ED    ____________________________________________   PROCEDURES  Procedure(s) performed:   Procedures  None ____________________________________________   INITIAL IMPRESSION / ASSESSMENT AND PLAN / ED COURSE  Pertinent labs & imaging results that were available during my care of the patient were reviewed by me and considered in my medical decision making (see chart for details).  Patient presents to the ED with mild elevated BP and tension-type HA symptoms. HA is gradual in onset and mild. Has been acute on chronic. No history or exam findings to suggest mass, SAH, or other emergency type HA symptoms. Labs obtained which show no evidence of end-organ damage. Refilled HCTZ and provided follow up information. Patient agrees to resume taking BP medications. No re-evaluation her HA is improved with Motrin.   At this time, I do not feel there is any life-threatening condition present. I have reviewed and discussed all results (EKG, imaging, lab, urine as appropriate), exam findings with patient. I have reviewed nursing notes and appropriate previous records.  I feel the patient is safe to be discharged home without further emergent workup. Discussed usual and customary return precautions. Patient and family (if present) verbalize understanding  and are comfortable with this plan.  Patient will follow-up with their primary care provider. If they do not have a primary care provider, information for follow-up has been provided to them. All questions have been answered.  ____________________________________________  FINAL CLINICAL IMPRESSION(S) / ED DIAGNOSES  Final diagnoses:  Essential hypertension  Acute non intractable tension-type headache     MEDICATIONS GIVEN DURING THIS VISIT:  Medications  ibuprofen (ADVIL,MOTRIN) tablet 800 mg (800 mg Oral Given 02/08/17 0914)     NEW OUTPATIENT MEDICATIONS STARTED DURING THIS VISIT:  None   Note:  This document was prepared using Dragon voice recognition software and may include unintentional dictation errors.  Alona Bene, MD Emergency Medicine   Rushawn Capshaw, Arlyss Repress, MD 02/09/17 1028

## 2017-02-08 NOTE — ED Triage Notes (Signed)
Pt with headache x 2 days, anterior and posterior head radiating down neck. Pt states she has tried Aleve, no relief. Pt also reports blood pressure has been elevated at home and patient has not taken bp meds in over 6 months.

## 2017-02-08 NOTE — ED Notes (Addendum)
Pt called out reporting she is ready to leave/be discharged.  Informed by this writer that she wasn't up for discharge yet.  Pt asking for urine results.  Informed Primary RNs were in shift change, but I would ask them to follow up.

## 2017-04-02 ENCOUNTER — Encounter: Payer: BLUE CROSS/BLUE SHIELD | Admitting: Primary Care

## 2018-06-24 ENCOUNTER — Ambulatory Visit (HOSPITAL_COMMUNITY): Payer: BLUE CROSS/BLUE SHIELD

## 2018-06-24 ENCOUNTER — Encounter (HOSPITAL_COMMUNITY): Payer: Self-pay | Admitting: Emergency Medicine

## 2018-06-24 ENCOUNTER — Ambulatory Visit (INDEPENDENT_AMBULATORY_CARE_PROVIDER_SITE_OTHER): Payer: BLUE CROSS/BLUE SHIELD

## 2018-06-24 ENCOUNTER — Ambulatory Visit (HOSPITAL_COMMUNITY)
Admission: EM | Admit: 2018-06-24 | Discharge: 2018-06-24 | Disposition: A | Discharge status: Home / Self Care | Payer: BLUE CROSS/BLUE SHIELD | Attending: Internal Medicine | Admitting: Internal Medicine

## 2018-06-24 DIAGNOSIS — M79604 Pain in right leg: Secondary | ICD-10-CM

## 2018-06-24 DIAGNOSIS — W19XXXA Unspecified fall, initial encounter: Secondary | ICD-10-CM

## 2018-06-24 NOTE — Discharge Instructions (Signed)
Take Ibuprofen 800 mg every 8h for pain and inflammation. If you develop nausea or upper central abdominal pain, then stop it and switch to Tylenol Follow up with your family Dr. Bonita Quin may need to have an ultrasound done on area of pain and swelling.

## 2018-06-24 NOTE — ED Triage Notes (Signed)
PT reports she slipped and fell 3-4 weeks ago and has had increasing pain and swelling to right calf since then.

## 2018-06-24 NOTE — ED Provider Notes (Signed)
[facility]    CSN: [id number] Arrival date & time: 06/24/18  1734     History   Chief Complaint Chief Complaint  Patient presents with  . Leg Pain    HPI Suzanne [name] is a 32 y.o. female.   Pt slipped and fail 4 weeks ago and did not hurt at all after she got up. Then a couple of days later she felt pain on proximal shin and has been limping. Last night it kept her up due  To pain. It also hurts to walk on it. She has never injured this area before. She also noticed swelling on proximal shin since she fell. She denies having any scrapes or bruises on her R knee when she fell. Pain is worse if she sits for a while and starts walking. Nothing has helped the  Pain. She has taken tramadol, and Ibuprofen and BC powder but helped a little. She has not iced it or used heat on this area. Denies numbness or tingling, but feels a burning sensation on the swollen area.      Past Medical History:  Diagnosis Date  . Acid reflux   . Blood transfusion without reported diagnosis   . Hypertension   . Sickle cell trait [facility])     Patient Active Problem List   Diagnosis Date Noted  . GERD (gastroesophageal reflux disease) 03/30/2016  . Morbid obesity with BMI of 45.0-49.9, adult (HCC) 01/14/2016  . Birth control 04/30/2015  . Preventative health care 01/06/2015  . Essential hypertension 12/17/2014  . Acute diastolic CHF (congestive heart failure) (HCC) 10/18/2013  . Anemia 10/12/2013  . SICKLE CELL TRAIT 11/22/2006    Past Surgical History:  Procedure Laterality Date  . CESAREAN SECTION N/A 10/08/2013   Procedure: CESAREAN SECTION;  Surgeon: Antionette Char, MD;  Location: [facility];  Service: Obstetrics;  Laterality: N/A;    OB History    Gravida  1   Para  1   Term  1   Preterm      AB      Living  1     SAB      TAB      Ectopic      Multiple      Live Births  1            Home Medications    Prior to Admission medications   Medication  Sig Start Date End Date Taking? Authorizing Provider  hydrochlorothiazide (MICROZIDE) 12.5 MG capsule Take 1 capsule (12.5 mg total) by mouth daily. 02/08/17 03/10/17  Long, Arlyss Repress, MD  ranitidine (ZANTAC) 150 MG tablet Take 1 tablet (150 mg total) by mouth 2 (two) times daily. Patient not taking: Reported on 10/02/2016 03/30/16   Doreene Nest, NP    Family History Family History  Problem Relation Age of Onset  . Diabetes Mother   . Arthritis Mother   . Hypertension Mother   . Diabetes Maternal Grandmother   . Hypertension Maternal Grandmother   . Cancer Neg Hx   . Heart disease Neg Hx     Social History Social History   Tobacco Use  . Smoking status: Never Smoker  . Smokeless tobacco: Never Used  Substance Use Topics  . Alcohol use: No  . Drug use: No     Allergies   Hydralazine and Lasix [furosemide]   Review of Systems Review of Systems   Physical Exam Triage Vital Signs ED Triage Vitals [06/24/18 1837]  Enc Vitals  Group     BP (!) 149/70     Pulse Rate 74     Resp 16     Temp 97.8 F (36.6 C)     Temp Source Oral     SpO2 100 %     Weight      Height      Head Circumference      Peak Flow      Pain Score 8     Pain Loc      Pain Edu?      Excl. in GC?    No data found.  Updated Vital Signs BP (!) 149/70   Pulse 74   Temp 97.8 F (36.6 C) (Oral)   Resp 16   LMP 06/12/2018   SpO2 100%   Visual Acuity Right Eye Distance:   Left Eye Distance:   Bilateral Distance:    Right Eye Near:   Left Eye Near:    Bilateral Near:     Physical Exam  Constitutional: She is oriented to person, place, and time. She appears well-developed.  HENT:  Head: Normocephalic.  Eyes: Conjunctivae are normal.  Neck: Normal range of motion. Neck supple.  Musculoskeletal: Normal range of motion. She exhibits tenderness. She exhibits no edema.  Has a limp and seems pain is provoked as she lifts her R leg to take a step. Has minimal soreness on swollen area  medially on proximal shin, medial and lateral knee region. Flexing her knee provoked shin pain, but no knee pain. Pivoting her R lower leg outward provoked pain slightly on shin region.   Neurological: She is alert and oriented to person, place, and time. No sensory deficit. She exhibits normal muscle tone.  Skin: Skin is warm and dry. Capillary refill takes less than 2 seconds. Rash noted. There is erythema.  Psychiatric: She has a normal mood and affect. Her behavior is normal. Judgment and thought content normal.     UC Treatments / Results  Labs (all labs ordered are listed, but only abnormal results are displayed) Labs Reviewed - No data to display  EKG None  Radiology No fractures noted, but has a split on the proximal medial muscle region. Final reading pending.   Procedures Procedures (including critical care time)  Medications Ordered in UC Medications - No data to display  Initial Impression / Assessment and Plan / UC Course  I have reviewed the triage vital signs and the nursing notes.  Pertinent labs & imaging results that were available during my care of the patient were reviewed by me and considered in my medical decision making (see chart for details).    Final Clinical Impressions(s) / UC Diagnoses   Final diagnoses:  Right leg pain     Discharge Instructions     Take Ibuprofen 800 mg every 8h for pain and inflammation. If you develop nausea or upper central abdominal pain, then stop it and switch to Tylenol Follow up with your family Dr. Bonita Quin may need to have an ultrasound done on area of pain and swelling.     ED Prescriptions    None     Controlled Substance Prescriptions [place] Controlled Substance Registry consulted?no   Garey Ham, [place] 06/24/18 2015

## 2018-09-15 ENCOUNTER — Inpatient Hospital Stay (HOSPITAL_COMMUNITY)
Admission: AD | Admit: 2018-09-15 | Discharge: 2018-09-15 | Disposition: A | Discharge status: Home / Self Care | Payer: Medicaid Other | Source: Ambulatory Visit | Attending: Obstetrics & Gynecology | Admitting: Obstetrics & Gynecology

## 2018-09-15 ENCOUNTER — Other Ambulatory Visit: Payer: Self-pay

## 2018-09-15 ENCOUNTER — Encounter (HOSPITAL_COMMUNITY): Payer: Self-pay

## 2018-09-15 DIAGNOSIS — Z881 Allergy status to other antibiotic agents status: Secondary | ICD-10-CM | POA: Insufficient documentation

## 2018-09-15 DIAGNOSIS — I10 Essential (primary) hypertension: Secondary | ICD-10-CM | POA: Insufficient documentation

## 2018-09-15 DIAGNOSIS — T192XXA Foreign body in vulva and vagina, initial encounter: Secondary | ICD-10-CM

## 2018-09-15 DIAGNOSIS — D573 Sickle-cell trait: Secondary | ICD-10-CM | POA: Insufficient documentation

## 2018-09-15 DIAGNOSIS — Z888 Allergy status to other drugs, medicaments and biological substances status: Secondary | ICD-10-CM | POA: Insufficient documentation

## 2018-09-15 DIAGNOSIS — Z79899 Other long term (current) drug therapy: Secondary | ICD-10-CM | POA: Insufficient documentation

## 2018-09-15 LAB — URINALYSIS, ROUTINE W REFLEX MICROSCOPIC
Bilirubin Urine: NEGATIVE
Glucose, UA: NEGATIVE mg/dL
Ketones, ur: NEGATIVE mg/dL
Leukocytes, UA: NEGATIVE
Nitrite: NEGATIVE
Protein, ur: NEGATIVE mg/dL
RBC / HPF: >50 RBC/hpf — ABNORMAL HIGH (ref 0–5)
Specific Gravity, Urine: 1.008 (ref 1.005–1.030)
pH: 7 (ref 5.0–8.0)

## 2018-09-15 LAB — WET PREP, GENITAL
Clue Cells Wet Prep HPF POC: NONE SEEN
Sperm: NONE SEEN
Trich, Wet Prep: NONE SEEN
Yeast Wet Prep HPF POC: NONE SEEN

## 2018-09-15 LAB — POCT PREGNANCY, URINE: Preg Test, Ur: NEGATIVE

## 2018-09-15 MED ORDER — LEVONORGESTREL 1.5 MG PO TABS: 1.5000 mg | ORAL_TABLET | Freq: Once | ORAL | 2 refills | Status: AC

## 2018-09-15 MED ORDER — LEVONORGESTREL 1.5 MG PO TABS: 1.5000 mg | ORAL_TABLET | Freq: Once | ORAL | 2 refills | Status: DC

## 2018-09-15 NOTE — MAU Provider Note (Addendum)
Chief Complaint: Condom stuck in vagina   None     SUBJECTIVE HPI: Suzanne [name] is a 32 y.o. G2P1011 at Unknown by LMP who presents to maternity admissions reporting condom remains in vagina after intercourse at 10 am today.  She reports intercourse with her monogamous partner and afterwards condom was not found.  She was not able to remove it herself but reports she can "feel it in there".  There are no other symptoms. She has not tried other treatments.      HPI  Past Medical History:  Diagnosis Date  . Acid reflux   . Blood transfusion without reported diagnosis   . Hypertension   . Sickle cell trait [facility])    Past Surgical History:  Procedure Laterality Date  . CESAREAN SECTION N/A 10/08/2013   Procedure: CESAREAN SECTION;  Surgeon: Antionette Char, MD;  Location: [facility];  Service: Obstetrics;  Laterality: N/A;   Social History   Socioeconomic History  . Marital status: Single    Spouse name: Not on file  . Number of children: Not on file  . Years of education: Not on file  . Highest education level: Not on file  Occupational History  . Not on file  Social Needs  . Financial resource strain: Not on file  . Food insecurity:    Worry: Not on file    Inability: Not on file  . Transportation needs:    Medical: Not on file    Non-medical: Not on file  Tobacco Use  . Smoking status: Never Smoker  . Smokeless tobacco: Never Used  Substance and Sexual Activity  . Alcohol use: No  . Drug use: No  . Sexual activity: Yes    Partners: Male    Birth control/protection: Condom  Lifestyle  . Physical activity:    Days per week: Not on file    Minutes per session: Not on file  . Stress: Not on file  Relationships  . Social connections:    Talks on phone: Not on file    Gets together: Not on file    Attends religious service: Not on file    Active member of club or organization: Not on file    Attends meetings of clubs or organizations: Not on file    Relationship  status: Not on file  . Intimate partner violence:    Fear of current or ex partner: Not on file    Emotionally abused: Not on file    Physically abused: Not on file    Forced sexual activity: Not on file  Other Topics Concern  . Not on file  Social History Narrative   Completed some collage   Works at [employer]   From [place]   Has one son, 1 years.   Enjoys playing with her son, sleeping.   Family is established here as well.   No current facility-administered medications on file prior to encounter.    Current Outpatient Medications on File Prior to Encounter  Medication Sig Dispense Refill  . hydrochlorothiazide (MICROZIDE) 12.5 MG capsule Take 1 capsule (12.5 mg total) by mouth daily. 30 capsule 0  . ranitidine (ZANTAC) 150 MG tablet Take 1 tablet (150 mg total) by mouth 2 (two) times daily. (Patient not taking: Reported on 10/02/2016) 60 tablet 3   Allergies  Allergen Reactions  . Hydralazine Itching  . Lasix [Furosemide] Swelling    Mouth and right side of face     ROS:  Review of Systems  Constitutional:  Negative for chills, fatigue and fever.  Respiratory: Negative for shortness of breath.   Cardiovascular: Negative for chest pain.  Genitourinary: Negative for difficulty urinating, dysuria, flank pain, pelvic pain, vaginal bleeding, vaginal discharge and vaginal pain.  Neurological: Negative for dizziness and headaches.  Psychiatric/Behavioral: Negative.      I have reviewed patient's Past Medical Hx, Surgical Hx, Family Hx, Social Hx, medications and allergies.   Physical Exam   Patient Vitals for the past 24 hrs:  BP Temp Temp src Pulse Resp Height Weight  09/15/18 1451 - - - - 20 - -  09/15/18 1149 121/81 97.9 F (36.6 C) Oral 97 18 4\' 10"  (1.473 m) 117.1 kg   Constitutional: Well-developed, well-nourished female in no acute distress.  Cardiovascular: normal rate Respiratory: normal effort GI: Abd soft, non-tender. Pos BS x 4 MS: Extremities  nontender, no edema, normal ROM Neurologic: Alert and oriented x 4.  GU: Neg CVAT.  PELVIC EXAM: Cervix pink, visually closed, without lesion, scant white creamy discharge, vaginal walls and external genitalia normal, condom is present, appears intact, in front of cervix, removed without difficulty with ring forceps   LAB RESULTS Results for orders placed or performed during the hospital encounter of 09/15/18 (from the past 24 hour(s))  Urinalysis, Routine w reflex microscopic     Status: Abnormal   Collection Time: 09/15/18 11:57 AM  Result Value Ref Range   Color, Urine YELLOW YELLOW   APPearance CLEAR CLEAR   Specific Gravity, Urine 1.008 1.005 - 1.030   pH 7.0 5.0 - 8.0   Glucose, UA NEGATIVE NEGATIVE mg/dL   Hgb urine dipstick LARGE (A) NEGATIVE   Bilirubin Urine NEGATIVE NEGATIVE   Ketones, ur NEGATIVE NEGATIVE mg/dL   Protein, ur NEGATIVE NEGATIVE mg/dL   Nitrite NEGATIVE NEGATIVE   Leukocytes, UA NEGATIVE NEGATIVE   RBC / HPF >50 (H) 0 - 5 RBC/hpf   WBC, UA 0-5 0 - 5 WBC/hpf   Bacteria, UA RARE (A) NONE SEEN   Squamous Epithelial / LPF 0-5 0 - 5   Mucus PRESENT   Pregnancy, urine POC     Status: None   Collection Time: 09/15/18 12:09 PM  Result Value Ref Range   Preg Test, Ur NEGATIVE NEGATIVE       IMAGING No results found.  MAU Management/MDM: Condom removed easily during pelvic exam. Pt with no other complaints.  Offered STD testing today.  GCC, wet prep, HIV, RPR, Hep C pending.  D/C home. Continue safe sex practices.   Pt discharged with strict return precautions.  Addendum:  Offered pt Plan B for pregnancy prevention.  Rx sent to pt pharmacy for Plan B.  ASSESSMENT 1. Foreign body in vagina, initial encounter     PLAN Discharge home Allergies as of 09/15/2018      Reactions   Hydralazine Itching   Lasix [furosemide] Swelling   Mouth and right side of face      Medication List    STOP taking these medications   ranitidine 150 MG  tablet Commonly known as:  ZANTAC     TAKE these medications   hydrochlorothiazide 12.5 MG capsule Commonly known as:  MICROZIDE Take 1 capsule (12.5 mg total) by mouth daily.      Follow-up Information    [facility].   Why:  For routine Gyn care. Return to MAU as needed for gyn emergencies.  Contact information: [address] [place] [phone]  Sharen Counter Certified Nurse-Midwife 09/15/2018  2:51 PM

## 2018-09-15 NOTE — MAU Note (Signed)
States she has a condom stuck inside her, states it has been up there for an hour.  Tried to get it out and her nails are long so that caused a little bit of bleeding  No pain

## 2018-09-16 LAB — GC/CHLAMYDIA PROBE AMP (~~LOC~~) NOT AT ARMC
Chlamydia: NEGATIVE
Neisseria Gonorrhea: NEGATIVE

## 2018-09-16 LAB — HIV ANTIBODY (ROUTINE TESTING W REFLEX): HIV Screen 4th Generation wRfx: NONREACTIVE

## 2018-09-16 LAB — HEPATITIS C ANTIBODY: HCV Ab: <0.1 s/co ratio (ref 0.0–0.9)

## 2018-09-17 LAB — RPR: RPR Ser Ql: NONREACTIVE

## 2018-09-25 ENCOUNTER — Encounter (HOSPITAL_COMMUNITY): Payer: Self-pay | Admitting: Emergency Medicine

## 2018-09-25 ENCOUNTER — Ambulatory Visit (HOSPITAL_COMMUNITY)
Admission: EM | Admit: 2018-09-25 | Discharge: 2018-09-25 | Disposition: A | Discharge status: Home / Self Care | Payer: BLUE CROSS/BLUE SHIELD | Attending: Family Medicine | Admitting: Family Medicine

## 2018-09-25 DIAGNOSIS — J069 Acute upper respiratory infection, unspecified: Secondary | ICD-10-CM | POA: Insufficient documentation

## 2018-09-25 DIAGNOSIS — B9789 Other viral agents as the cause of diseases classified elsewhere: Secondary | ICD-10-CM | POA: Insufficient documentation

## 2018-09-25 MED ORDER — CETIRIZINE HCL 10 MG PO TABS: 10.0000 mg | ORAL_TABLET | Freq: Every day | ORAL | 0 refills | Status: DC

## 2018-09-25 MED ORDER — DM-GUAIFENESIN ER 30-600 MG PO TB12: 1.0000 | ORAL_TABLET | Freq: Two times a day (BID) | ORAL | 0 refills | Status: DC

## 2018-09-25 MED ORDER — BENZONATATE 100 MG PO CAPS: 100.0000 mg | ORAL_CAPSULE | Freq: Three times a day (TID) | ORAL | 0 refills | Status: DC

## 2018-09-25 NOTE — Discharge Instructions (Addendum)
I believe this is a viral upper respiratory infection You did have a little bit of fluid in your ear but no infection. Your lungs were clear We will do Mucinex DM for cough and congestion Zyrtec for antihistamine Tessalon Perles for worsening cough Work note given Follow up as needed for continued or worsening symptoms

## 2018-09-25 NOTE — ED Triage Notes (Signed)
Pt c/o cough, headache, congestion since monday

## 2018-09-25 NOTE — ED Provider Notes (Signed)
[facility]    CSN: [id number] Arrival date & time: 09/25/18  1225     History   Chief Complaint Chief Complaint  Patient presents with  . URI    HPI Jowanda L Hedding is a 33 y.o. female.   Patient is a 33 year old female with past medical history of acid reflux, hypertension.  She reports cough, congestion, body aches, chills, rhinorrhea since Monday.  Symptoms have been constant and remain the same.  She has been using Alka-Seltzer cold and DayQuil for her symptoms.  She does get some relief from the medication.  She has had a few sick contacts at work.  1 coworker was positive for flu and pneumonia.  She does not have any reported fevers at home, nausea, vomiting, diarrhea.  ROS per HPI    URI    Past Medical History:  Diagnosis Date  . Acid reflux   . Blood transfusion without reported diagnosis   . Hypertension   . Sickle cell trait [facility])     Patient Active Problem List   Diagnosis Date Noted  . GERD (gastroesophageal reflux disease) 03/30/2016  . Morbid obesity with BMI of 45.0-49.9, adult (HCC) 01/14/2016  . Birth control 04/30/2015  . Preventative health care 01/06/2015  . Essential hypertension 12/17/2014  . Acute diastolic CHF (congestive heart failure) (HCC) 10/18/2013  . Anemia 10/12/2013  . SICKLE CELL TRAIT 11/22/2006    Past Surgical History:  Procedure Laterality Date  . CESAREAN SECTION N/A 10/08/2013   Procedure: CESAREAN SECTION;  Surgeon: Antionette Char, MD;  Location: [facility];  Service: Obstetrics;  Laterality: N/A;    OB History    Gravida  2   Para  1   Term  1   Preterm      AB  1   Living  1     SAB      TAB  1   Ectopic      Multiple      Live Births  1            Home Medications    Prior to Admission medications   Medication Sig Start Date End Date Taking? Authorizing Provider  benzonatate (TESSALON) 100 MG capsule Take 1 capsule (100 mg total) by mouth every 8 (eight) hours. 09/25/18   Dahlia Byes A, NP  cetirizine (ZYRTEC) 10 MG tablet Take 1 tablet (10 mg total) by mouth daily. 09/25/18   Jantzen Pilger, Gloris Manchester A, NP  dextromethorphan-guaiFENesin (MUCINEX DM) 30-600 MG 12hr tablet Take 1 tablet by mouth 2 (two) times daily. 09/25/18   Dahlia Byes A, NP  hydrochlorothiazide (MICROZIDE) 12.5 MG capsule Take 1 capsule (12.5 mg total) by mouth daily. 02/08/17 03/10/17  Long, Arlyss Repress, MD    Family History Family History  Problem Relation Age of Onset  . Diabetes Mother   . Arthritis Mother   . Hypertension Mother   . Diabetes Maternal Grandmother   . Hypertension Maternal Grandmother   . Cancer Neg Hx   . Heart disease Neg Hx     Social History Social History   Tobacco Use  . Smoking status: Never Smoker  . Smokeless tobacco: Never Used  Substance Use Topics  . Alcohol use: No  . Drug use: No     Allergies   Hydralazine and Lasix [furosemide]   Review of Systems Review of Systems   Physical Exam Triage Vital Signs ED Triage Vitals [09/25/18 1336]  Enc Vitals Group     BP 121/85  Pulse Rate 99     Resp 18     Temp 98.8 F (37.1 C)     Temp Source Oral     SpO2 98 %     Weight      Height      Head Circumference      Peak Flow      Pain Score      Pain Loc      Pain Edu?      Excl. in GC?    No data found.  Updated Vital Signs BP 121/85 (BP Location: Left Arm)   Pulse 99   Temp 98.8 F (37.1 C) (Oral)   Resp 18   LMP 09/01/2018 (Approximate)   SpO2 98%   Visual Acuity Right Eye Distance:   Left Eye Distance:   Bilateral Distance:    Right Eye Near:   Left Eye Near:    Bilateral Near:     Physical Exam Vitals signs and nursing note reviewed.  Constitutional:      General: She is not in acute distress.    Appearance: Normal appearance. She is not ill-appearing, toxic-appearing or diaphoretic.  HENT:     Head: Normocephalic and atraumatic.     Left Ear: Hearing and ear canal normal. A middle ear effusion is present.     Mouth/Throat:      Lips: Pink.     Mouth: Mucous membranes are moist.     Pharynx: Oropharynx is clear. No pharyngeal swelling, oropharyngeal exudate, posterior oropharyngeal erythema or uvula swelling.     Tonsils: No tonsillar exudate or tonsillar abscesses. Swelling: 0 on the right. 0 on the left.  Eyes:     Conjunctiva/sclera: Conjunctivae normal.  Neck:     Musculoskeletal: Normal range of motion.  Cardiovascular:     Rate and Rhythm: Normal rate and regular rhythm.     Heart sounds: Normal heart sounds.  Pulmonary:     Effort: Pulmonary effort is normal.     Breath sounds: Normal breath sounds.  Musculoskeletal: Normal range of motion.  Lymphadenopathy:     Cervical: No cervical adenopathy.  Skin:    General: Skin is warm and dry.  Neurological:     Mental Status: She is alert.      UC Treatments / Results  Labs (all labs ordered are listed, but only abnormal results are displayed) Labs Reviewed - No data to display  EKG None  Radiology No results found.  Procedures Procedures (including critical care time)  Medications Ordered in UC Medications - No data to display  Initial Impression / Assessment and Plan / UC Course  I have reviewed the triage vital signs and the nursing notes.  Pertinent labs & imaging results that were available during my care of the patient were reviewed by me and considered in my medical decision making (see chart for details).     Most likely viral upper respiratory infection. Vital signs stable, nontoxic or ill-appearing. Lungs clear on exam We will have her do Mucinex DM for cough and congestion Zyrtec for antihistamine Tessalon Perles for worsening cough Follow up as needed for continued or worsening symptoms  Final Clinical Impressions(s) / UC Diagnoses   Final diagnoses:  Viral URI with cough     Discharge Instructions     I believe this is a viral upper respiratory infection You did have a little bit of fluid in your ear but no  infection. Your lungs were clear We will do Mucinex DM  for cough and congestion Zyrtec for antihistamine Tessalon Perles for worsening cough Work note given Follow up as needed for continued or worsening symptoms     ED Prescriptions    Medication Sig Dispense Auth. Provider   dextromethorphan-guaiFENesin (MUCINEX DM) 30-600 MG 12hr tablet Take 1 tablet by mouth 2 (two) times daily. 15 tablet Laela Deviney A, NP   cetirizine (ZYRTEC) 10 MG tablet Take 1 tablet (10 mg total) by mouth daily. 30 tablet Karlie Aung A, NP   benzonatate (TESSALON) 100 MG capsule Take 1 capsule (100 mg total) by mouth every 8 (eight) hours. 21 capsule Dahlia ByesBast, Debria Broecker A, NP     Controlled Substance Prescriptions [place] Controlled Substance Registry consulted? Not Applicable   Janace ArisBast, Leocadio Heal A, NP 09/25/18 1423

## 2018-10-12 ENCOUNTER — Other Ambulatory Visit: Payer: Self-pay

## 2018-10-12 ENCOUNTER — Encounter (HOSPITAL_COMMUNITY): Payer: Self-pay | Admitting: *Deleted

## 2018-10-12 ENCOUNTER — Emergency Department (HOSPITAL_COMMUNITY): Payer: BLUE CROSS/BLUE SHIELD

## 2018-10-12 ENCOUNTER — Emergency Department (HOSPITAL_COMMUNITY)
Admission: EM | Admit: 2018-10-12 | Discharge: 2018-10-12 | Disposition: A | Discharge status: Home / Self Care | Payer: BLUE CROSS/BLUE SHIELD | Attending: Emergency Medicine | Admitting: Emergency Medicine

## 2018-10-12 DIAGNOSIS — Z79899 Other long term (current) drug therapy: Secondary | ICD-10-CM | POA: Diagnosis not present

## 2018-10-12 DIAGNOSIS — R05 Cough: Secondary | ICD-10-CM | POA: Insufficient documentation

## 2018-10-12 DIAGNOSIS — I10 Essential (primary) hypertension: Secondary | ICD-10-CM | POA: Diagnosis not present

## 2018-10-12 DIAGNOSIS — R0602 Shortness of breath: Secondary | ICD-10-CM | POA: Insufficient documentation

## 2018-10-12 DIAGNOSIS — R059 Cough, unspecified: Secondary | ICD-10-CM

## 2018-10-12 MED ORDER — ALBUTEROL SULFATE (2.5 MG/3ML) 0.083% IN NEBU
5.0000 mg | INHALATION_SOLUTION | Freq: Once | RESPIRATORY_TRACT | Status: AC
  Administered 2018-10-12: 5 mg via RESPIRATORY_TRACT
  Filled 2018-10-12: qty 6

## 2018-10-12 MED ORDER — FLUTICASONE PROPIONATE 50 MCG/ACT NA SUSP: 1.0000 | Freq: Every day | NASAL | 0 refills | Status: DC

## 2018-10-12 MED ORDER — BENZONATATE 100 MG PO CAPS: 100.0000 mg | ORAL_CAPSULE | Freq: Three times a day (TID) | ORAL | 0 refills | Status: DC

## 2018-10-12 MED ORDER — ALBUTEROL SULFATE HFA 108 (90 BASE) MCG/ACT IN AERS
1.0000 | INHALATION_SPRAY | Freq: Once | RESPIRATORY_TRACT | Status: AC
  Administered 2018-10-12: 2 via RESPIRATORY_TRACT
  Filled 2018-10-12: qty 6.7

## 2018-10-12 NOTE — ED Provider Notes (Signed)
[facility]-EMERGENCY DEPT Provider Note   CSN: [id number] Arrival date & time: 10/12/18  2117     History   Chief Complaint Chief Complaint  Patient presents with  . Cough  . Shortness of Breath    HPI Suzanne [name] is a 33 y.o. female with a hx of GERD, sickle cell trait, HTN, and morbid obesity who presents to the ED with complaints of cough x 3-4 days. Patient states she had URI type sxs 09/25/18- she was seen at [facility] and thought to have a viral URI- given supportive therapy and had improvement with this. She states over the past few days her sxs of cough, dyspnea, and chest discomfort have returned. Cough is dry, but she feels she needs to cough something up. Dyspnea is with cough, but can occur without it. She states she feels she is wheezing at times. Her chest hers from coughing, mostly pain with coughing otherwise feels sore. No intervention PTA. Denies fever, chills, congestion, sore throat, ear pain, nausea, vomiting, or chance of pregnancy. Denies leg pain/swelling, hemoptysis, recent surgery/trauma, recent long travel, hormone use, personal hx of cancer, or hx of DVT/PE.   HPI  Past Medical History:  Diagnosis Date  . Acid reflux   . Blood transfusion without reported diagnosis   . Hypertension   . Sickle cell trait [facility])     Patient Active Problem List   Diagnosis Date Noted  . GERD (gastroesophageal reflux disease) 03/30/2016  . Morbid obesity with BMI of 45.0-49.9, adult (HCC) 01/14/2016  . Birth control 04/30/2015  . Preventative health care 01/06/2015  . Essential hypertension 12/17/2014  . Acute diastolic CHF (congestive heart failure) (HCC) 10/18/2013  . Anemia 10/12/2013  . SICKLE CELL TRAIT 11/22/2006    Past Surgical History:  Procedure Laterality Date  . CESAREAN SECTION N/A 10/08/2013   Procedure: CESAREAN SECTION;  Surgeon: Antionette Char, MD;  Location: [facility];  Service: Obstetrics;  Laterality: N/A;     OB History    Gravida  2   Para  1   Term  1   Preterm      AB  1   Living  1     SAB      TAB  1   Ectopic      Multiple      Live Births  1            Home Medications    Prior to Admission medications   Medication Sig Start Date End Date Taking? Authorizing Provider  benzonatate (TESSALON) 100 MG capsule Take 1 capsule (100 mg total) by mouth every 8 (eight) hours. 09/25/18   Dahlia Byes A, NP  cetirizine (ZYRTEC) 10 MG tablet Take 1 tablet (10 mg total) by mouth daily. 09/25/18   Bast, Gloris Manchester A, NP  dextromethorphan-guaiFENesin (MUCINEX DM) 30-600 MG 12hr tablet Take 1 tablet by mouth 2 (two) times daily. 09/25/18   Dahlia Byes A, NP  hydrochlorothiazide (MICROZIDE) 12.5 MG capsule Take 1 capsule (12.5 mg total) by mouth daily. 02/08/17 03/10/17  Long, Arlyss Repress, MD    Family History Family History  Problem Relation Age of Onset  . Diabetes Mother   . Arthritis Mother   . Hypertension Mother   . Diabetes Maternal Grandmother   . Hypertension Maternal Grandmother   . Cancer Neg Hx   . Heart disease Neg Hx     Social History Social History   Tobacco Use  . Smoking status: Never Smoker  .  Smokeless tobacco: Never Used  Substance Use Topics  . Alcohol use: No  . Drug use: No     Allergies   Hydralazine and Lasix [furosemide]   Review of Systems Review of Systems  Constitutional: Negative for chills and fever.  HENT: Negative for congestion, ear pain, rhinorrhea and sore throat.   Respiratory: Positive for cough, shortness of breath and wheezing.   Cardiovascular: Positive for chest pain. Negative for palpitations and leg swelling.  Gastrointestinal: Negative for abdominal pain, nausea and vomiting.  Musculoskeletal: Negative for myalgias.  Neurological: Negative for weakness and numbness.  All other systems reviewed and are negative.   Physical Exam Updated Vital Signs BP (!) 140/99 (BP Location: Left Arm)   Pulse 94   Temp 98.4 F (36.9 C) (Oral)   Resp  18   Ht 4\' 11"  (1.499 m)   Wt 111.1 kg   LMP 10/02/2018 (Approximate)   SpO2 100%   BMI 49.48 kg/m   Physical Exam Vitals signs and nursing note reviewed.  Constitutional:      General: She is not in acute distress.    Appearance: She is well-developed.  HENT:     Head: Normocephalic and atraumatic.     Right Ear: Tympanic membrane is not perforated, erythematous, retracted or bulging.     Left Ear: Tympanic membrane is not perforated, erythematous, retracted or bulging.     Nose: Mucosal edema present. No rhinorrhea.     Mouth/Throat:     Pharynx: Uvula midline. No oropharyngeal exudate or posterior oropharyngeal erythema.  Eyes:     General:        Right eye: No discharge.        Left eye: No discharge.     Conjunctiva/sclera: Conjunctivae normal.     Pupils: Pupils are equal, round, and reactive to light.  Neck:     Musculoskeletal: Normal range of motion and neck supple. No edema, neck rigidity or crepitus.  Cardiovascular:     Rate and Rhythm: Normal rate and regular rhythm.     Pulses: Normal pulses.     Heart sounds: No murmur.  Pulmonary:     Effort: Pulmonary effort is normal. No respiratory distress.     Breath sounds: Normal breath sounds. No stridor. No wheezing, rhonchi or rales.     Comments: Respirations are even & unlabored.  Chest:     Chest wall: Lacerations (anterior chest wall- reproduces her chest pain, no overlying skin changes or palpable crepitus/deformity. ) present.  Abdominal:     General: There is no distension.     Palpations: Abdomen is soft.     Tenderness: There is no abdominal tenderness.  Musculoskeletal:     Right lower leg: No edema.     Left lower leg: No edema.  Lymphadenopathy:     Cervical: No cervical adenopathy.  Skin:    General: Skin is warm and dry.     Findings: No rash.  Neurological:     Mental Status: She is alert.  Psychiatric:        Behavior: Behavior normal.      ED Treatments / Results  Labs (all labs  ordered are listed, but only abnormal results are displayed) Labs Reviewed - No data to display  EKG EKG Interpretation  Date/Time:  Saturday October 12 2018 21:26:10 EST Ventricular Rate:  92 PR Interval:    QRS Duration: 80 QT Interval:  340 QTC Calculation: 421 R Axis:   12 Text Interpretation:  Sinus rhythm  Low voltage, precordial leads Borderline T abnormalities, diffuse leads No significant change since last tracing Confirmed by Linwood Dibbles [phone]) on 10/12/2018 10:35:57 PM   Radiology Dg Chest 2 View  Result Date: 10/12/2018 CLINICAL DATA:  Cough since January 1st. EXAM: CHEST - 2 VIEW COMPARISON:  01/03/2016 FINDINGS: Midline trachea.  Normal heart size and mediastinal contours. Sharp costophrenic angles.  No pneumothorax.  Clear lungs. IMPRESSION: Normal chest. Electronically Signed   By: Jeronimo Greaves M.D.   On: 10/12/2018 21:53    Procedures Procedures (including critical care time)  Medications Ordered in ED Medications  albuterol (PROVENTIL HFA;VENTOLIN HFA) 108 (90 Base) MCG/ACT inhaler 1-2 puff (has no administration in time range)  albuterol (PROVENTIL) (2.5 MG/3ML) 0.083% nebulizer solution 5 mg (5 mg Nebulization Given 10/12/18 2154)     Initial Impression / Assessment and Plan / ED Course  I have reviewed the triage vital signs and the nursing notes.  Pertinent labs & imaging results that were available during my care of the patient were reviewed by me and considered in my medical decision making (see chart for details).   Patient presents to the emergency department with cough, dyspnea, and chest soreness.  Patient nontoxic-appearing, no apparent distress, vitals WNL with the exception of elevated blood pressure, doubt HTN emergency.  She has a fairly benign physical exam.  Her chest pain is reproducible with anterior chest wall palpation.  Her lungs are clear to auscultation bilaterally and she does not appear to be in respiratory distress.  Chest x-ray per  triage protocol is negative for infiltrate suggesting pneumonia, pneumothorax, effusion, edema, fracture/dislocation, or other acute findings.  EKG without STEMI, her chest pain is reproducible with palpation, I doubt ACS.  Patient is PERC negative, doubt pulmonary embolism.  She did not have any wheezing on my exam, she did report some wheezing at home, she was given an albuterol neb treatment per triage protocol and state she feels this helped open things up. Suspect viral vs. Allergic, possibly degree of bronchitis. Will discharge home with supportive care including flonase, tessalon (as this helped before), and albuterol inhaler. PCP follow up. I discussed results, treatment plan, need for follow-up, and return precautions with the patient. Provided opportunity for questions, patient confirmed understanding and is in agreement with plan.    Final Clinical Impressions(s) / ED Diagnoses   Final diagnoses:  Cough  Shortness of breath    ED Discharge Orders         Ordered    fluticasone (FLONASE) 50 MCG/ACT nasal spray  Daily     10/12/18 2234    benzonatate (TESSALON) 100 MG capsule  Every 8 hours     10/12/18 2234           Alba Kriesel, [name], PA-C 10/12/18 2236    Linwood Dibbles, MD 10/13/18 0010

## 2018-10-12 NOTE — Discharge Instructions (Signed)
You are seen in the emergency department today for cough, shortness of breath, and some chest discomfort.  Your chest x-ray was normal.  Your EKG was reassuring.  We suspect that you have a virus or allergies, suspect this more likely a virus.  We are sending you home with the following medicines to help with care: -Flonase: This is an intranasal steroid to help with congestion and postnasal drip.  Use 1 spray in each nostril daily -Tessalon: This is a cough medicine, take 1 to 2 tablets every 8 hours as needed for cough -Inhaler: This was given in the ER, please use 1 to 2 puffs every 4-6 hours as needed for shortness of breath or wheezing  We have prescribed you new medication(s) today. Discuss the medications prescribed today with your pharmacist as they can have adverse effects and interactions with your other medicines including over the counter and prescribed medications. Seek medical evaluation if you start to experience new or abnormal symptoms after taking one of these medicines, seek care immediately if you start to experience difficulty breathing, feeling of your throat closing, facial swelling, or rash as these could be indications of a more serious allergic reaction  Motrin/Tylenol per over-the-counter dosing to help with chest discomfort.  Please follow-up with primary care in 3 to 5 days.  Return to the ER for new or worsening symptoms or any other concerns.

## 2018-10-12 NOTE — ED Triage Notes (Signed)
Pt states "I've been sick since the 1/1 when I was seen @ [facility].  They told me to take zyrtec, mucinex & tessalon pearls but I have kept the cough.  I cough up stuff but haven't paid attention to the color."  Pt is in no respiratory distress @ this time.

## 2018-10-23 ENCOUNTER — Encounter: Payer: Self-pay | Admitting: Primary Care

## 2018-10-23 ENCOUNTER — Ambulatory Visit (INDEPENDENT_AMBULATORY_CARE_PROVIDER_SITE_OTHER)
Admission: RE | Admit: 2018-10-23 | Discharge: 2018-10-23 | Disposition: A | Discharge status: Home / Self Care | Payer: BLUE CROSS/BLUE SHIELD | Source: Ambulatory Visit | Attending: Primary Care | Admitting: Primary Care

## 2018-10-23 ENCOUNTER — Ambulatory Visit (INDEPENDENT_AMBULATORY_CARE_PROVIDER_SITE_OTHER): Payer: BLUE CROSS/BLUE SHIELD | Admitting: Primary Care

## 2018-10-23 VITALS — BP 140/96 | HR 79 | Temp 98.1°F | Ht 59.0 in | Wt 258.5 lb

## 2018-10-23 DIAGNOSIS — Z6841 Body Mass Index (BMI) 40.0 and over, adult: Secondary | ICD-10-CM

## 2018-10-23 DIAGNOSIS — K219 Gastro-esophageal reflux disease without esophagitis: Secondary | ICD-10-CM | POA: Diagnosis not present

## 2018-10-23 DIAGNOSIS — G8929 Other chronic pain: Secondary | ICD-10-CM | POA: Insufficient documentation

## 2018-10-23 DIAGNOSIS — M79604 Pain in right leg: Secondary | ICD-10-CM | POA: Diagnosis not present

## 2018-10-23 DIAGNOSIS — Z0000 Encounter for general adult medical examination without abnormal findings: Secondary | ICD-10-CM

## 2018-10-23 DIAGNOSIS — D649 Anemia, unspecified: Secondary | ICD-10-CM | POA: Diagnosis not present

## 2018-10-23 DIAGNOSIS — M25561 Pain in right knee: Secondary | ICD-10-CM | POA: Diagnosis not present

## 2018-10-23 DIAGNOSIS — M79606 Pain in leg, unspecified: Secondary | ICD-10-CM | POA: Insufficient documentation

## 2018-10-23 LAB — LIPID PANEL
Cholesterol: 155 mg/dL (ref 0–200)
HDL: 34.9 mg/dL — ABNORMAL LOW (ref >39.00)
LDL Cholesterol: 103 mg/dL — ABNORMAL HIGH (ref 0–99)
NonHDL: 120.53
Total CHOL/HDL Ratio: 4
Triglycerides: 87 mg/dL (ref 0.0–149.0)
VLDL: 17.4 mg/dL (ref 0.0–40.0)

## 2018-10-23 LAB — COMPREHENSIVE METABOLIC PANEL
ALT: 15 U/L (ref 0–35)
AST: 13 U/L (ref 0–37)
Albumin: 3.6 g/dL (ref 3.5–5.2)
Alkaline Phosphatase: 61 U/L (ref 39–117)
BUN: 8 mg/dL (ref 6–23)
CO2: 29 mEq/L (ref 19–32)
Calcium: 9.4 mg/dL (ref 8.4–10.5)
Chloride: 103 mEq/L (ref 96–112)
Creatinine, Ser: 0.65 mg/dL (ref 0.40–1.20)
GFR: 129.3 mL/min (ref >60.00)
Glucose, Bld: 95 mg/dL (ref 70–99)
Potassium: 4.3 mEq/L (ref 3.5–5.1)
Sodium: 138 mEq/L (ref 135–145)
Total Bilirubin: 0.3 mg/dL (ref 0.2–1.2)
Total Protein: 7 g/dL (ref 6.0–8.3)

## 2018-10-23 LAB — CBC
HCT: 37.5 % (ref 36.0–46.0)
Hemoglobin: 11.9 g/dL — ABNORMAL LOW (ref 12.0–15.0)
MCHC: 31.8 g/dL (ref 30.0–36.0)
MCV: 72.8 fl — ABNORMAL LOW (ref 78.0–100.0)
Platelets: 345 10*3/uL (ref 150.0–400.0)
RBC: 5.15 Mil/uL — ABNORMAL HIGH (ref 3.87–5.11)
RDW: 15.7 % — ABNORMAL HIGH (ref 11.5–15.5)
WBC: 6.9 10*3/uL (ref 4.0–10.5)

## 2018-10-23 LAB — HEMOGLOBIN A1C: Hgb A1c MFr Bld: 5.5 % (ref 4.6–6.5)

## 2018-10-23 LAB — TSH: TSH: 1.54 u[IU]/mL (ref 0.35–4.50)

## 2018-10-23 NOTE — Assessment & Plan Note (Signed)
Tetanus UTD. Declines influenza vaccination. Pap smear overdue- detection of HPV in 2017. She declines today as she is on her menstrual cycle. She will schedule a visit within the next several months. Discussed the importance of a healthy diet and regular exercise in order for weight loss, and to reduce the risk of any potential medical problems. Exam as noted. Labs pending.  Follow up in 1 year for CPE.

## 2018-10-23 NOTE — Assessment & Plan Note (Signed)
Repeat CBC pending. Denies menorrhagia.

## 2018-10-23 NOTE — Assessment & Plan Note (Signed)
Since fall in early September 2019. Evaluated at [facility], unremarkable work up. Check xray of tib/fib today. Consider PT, encouraged weight loss.  Exam today overall unremarkable. No alarm signs.

## 2018-10-23 NOTE — Assessment & Plan Note (Signed)
Strongly advised she work on weight loss through diet and exercise. Labs pending today.

## 2018-10-23 NOTE — Assessment & Plan Note (Signed)
Denies symptoms of GERD. Continue to monitor.

## 2018-10-23 NOTE — Patient Instructions (Addendum)
Stop by the lab and xray prior to leaving today. I will notify you of your results once received.   Start exercising. You should be getting 150 minutes of moderate intensity exercise weekly.  It's important to improve your diet by reducing consumption of fast food, fried food, processed snack foods, sugary drinks. Increase consumption of fresh vegetables and fruits, whole grains, water.  Ensure you are drinking 64 ounces of water daily.  Please update me if your shortness of breath persists.  Please update me with the readings from your blood pressure checks. You should be running less than 130/90.  Schedule a follow up visit for 2 months for blood pressure check and pap smear.  It was a pleasure to see you today!   Preventive Care 18-39 Years, Female Preventive care refers to lifestyle choices and visits with your health care provider that can promote health and wellness. What does preventive care include?   A yearly physical exam. This is also called an annual well check.  Dental exams once or twice a year.  Routine eye exams. Ask your health care provider how often you should have your eyes checked.  Personal lifestyle choices, including: ? Daily care of your teeth and gums. ? Regular physical activity. ? Eating a healthy diet. ? Avoiding tobacco and drug use. ? Limiting alcohol use. ? Practicing safe sex. ? Taking vitamin and mineral supplements as recommended by your health care provider. What happens during an annual well check? The services and screenings done by your health care provider during your annual well check will depend on your age, overall health, lifestyle risk factors, and family history of disease. Counseling Your health care provider may ask you questions about your:  Alcohol use.  Tobacco use.  Drug use.  Emotional well-being.  Home and relationship well-being.  Sexual activity.  Eating habits.  Work and work Statistician.  Method of  birth control.  Menstrual cycle.  Pregnancy history. Screening You may have the following tests or measurements:  Height, weight, and BMI.  Diabetes screening. This is done by checking your blood sugar (glucose) after you have not eaten for a while (fasting).  Blood pressure.  Lipid and cholesterol levels. These may be checked every 5 years starting at age 14.  Skin check.  Hepatitis C blood test.  Hepatitis B blood test.  Sexually transmitted disease (STD) testing.  BRCA-related cancer screening. This may be done if you have a family history of breast, ovarian, tubal, or peritoneal cancers.  Pelvic exam and Pap test. This may be done every 3 years starting at age 58. Starting at age 63, this may be done every 5 years if you have a Pap test in combination with an HPV test. Discuss your test results, treatment options, and if necessary, the need for more tests with your health care provider. Vaccines Your health care provider may recommend certain vaccines, such as:  Influenza vaccine. This is recommended every year.  Tetanus, diphtheria, and acellular pertussis (Tdap, Td) vaccine. You may need a Td booster every 10 years.  Varicella vaccine. You may need this if you have not been vaccinated.  HPV vaccine. If you are 7 or younger, you may need three doses over 6 months.  Measles, mumps, and rubella (MMR) vaccine. You may need at least one dose of MMR. You may also need a second dose.  Pneumococcal 13-valent conjugate (PCV13) vaccine. You may need this if you have certain conditions and were not previously vaccinated.  Pneumococcal  polysaccharide (PPSV23) vaccine. You may need one or two doses if you smoke cigarettes or if you have certain conditions.  Meningococcal vaccine. One dose is recommended if you are age 81-21 years and a first-year college student living in a residence hall, or if you have one of several medical conditions. You may also need additional booster  doses.  Hepatitis A vaccine. You may need this if you have certain conditions or if you travel or work in places where you may be exposed to hepatitis A.  Hepatitis B vaccine. You may need this if you have certain conditions or if you travel or work in places where you may be exposed to hepatitis B.  Haemophilus influenzae type b (Hib) vaccine. You may need this if you have certain risk factors. Talk to your health care provider about which screenings and vaccines you need and how often you need them. This information is not intended to replace advice given to you by your health care provider. Make sure you discuss any questions you have with your health care provider. Document Released: 11/07/2001 Document Revised: 04/24/2017 Document Reviewed: 07/13/2015 Elsevier Interactive Patient Education  2019 [employer].

## 2018-10-23 NOTE — Progress Notes (Signed)
Subjective:    Patient ID: Suzanne [name], female    DOB: 12-11-1985, 33 y.o.   MRN: [id number]  HPI  Suzanne [name] is a 33 year old female who presents today for complete physical.  She stopped taking her HCTZ numerous months ago. She's been checking her BP at home which is running "normal". She doesn't remember the numbers.   Recently evaluated in the ED, diagnosed with a viral URI. Has some residual cough and SOB.   Chronic right lower extremity pain from knee and below since a fall in early September. She was evaluated at [facility], xray of the right knee was unremarkable. There was no evaluation of her tib/fib. She was told to take Ibuprofen and follow up with PCP. Most of her pain comes with position changes from lying/sitting to standing. She is sedentary for 8 hours of her work day, will notice a lot of pain when getting up to stand. Has walked with a limp since. She denies numbness/tingling, re-injury.   Immunizations: -Tetanus: Completed in 2015 -Influenza: Declines   Diet: She endorses a fair diet Breakfast: Oatmeal  Lunch: Skips, sandwich (out and home made) Dinner: Pasta, fried/baked chicken, vegetables, salad Snacks: Chips, candy  Desserts: 5 days weekly  Beverages: Soda, water, unsweet tea flavor  Exercise: She is not exercising  Eye exam: No recent exam Dental exam: Completes annually  Pap Smear: Completed in 2017, overdue. Declines today.   BP Readings from Last 3 Encounters:  10/23/18 (!) 140/96  10/12/18 121/82  09/25/18 121/85     Review of Systems  Constitutional: Negative for unexpected weight change.  HENT: Negative for rhinorrhea.   Respiratory: Positive for cough. Negative for shortness of breath.   Cardiovascular: Negative for chest pain.  Gastrointestinal: Negative for constipation and diarrhea.  Genitourinary: Negative for difficulty urinating and menstrual problem.  Musculoskeletal: Positive for arthralgias and myalgias.  Skin: Negative for rash.    Allergic/Immunologic: Negative for environmental allergies.  Neurological: Negative for dizziness, numbness and headaches.  Psychiatric/Behavioral: The patient is not nervous/anxious.      Past Medical History:  Diagnosis Date  . Acid reflux   . Blood transfusion without reported diagnosis   . Hypertension   . Sickle cell trait (HCC)      Social History   Socioeconomic History  . Marital status: Single    Spouse name: Not on file  . Number of children: Not on file  . Years of education: Not on file  . Highest education level: Not on file  Occupational History  . Not on file  Social Needs  . Financial resource strain: Not on file  . Food insecurity:    Worry: Not on file    Inability: Not on file  . Transportation needs:    Medical: Not on file    Non-medical: Not on file  Tobacco Use  . Smoking status: Never Smoker  . Smokeless tobacco: Never Used  Substance and Sexual Activity  . Alcohol use: No  . Drug use: No  . Sexual activity: Yes    Partners: Male    Birth control/protection: Condom  Lifestyle  . Physical activity:    Days per week: Not on file    Minutes per session: Not on file  . Stress: Not on file  Relationships  . Social connections:    Talks on phone: Not on file    Gets together: Not on file    Attends religious service: Not on file    Active  member of club or organization: Not on file    Attends meetings of clubs or organizations: Not on file    Relationship status: Not on file  . Intimate partner violence:    Fear of current or ex partner: Not on file    Emotionally abused: Not on file    Physically abused: Not on file    Forced sexual activity: Not on file  Other Topics Concern  . Not on file  Social History Narrative   Completed some collage   Works at [employer]   From [place]   Has one son, 1 years.   Enjoys playing with her son, sleeping.   Family is established here as well.    Past Surgical History:  Procedure  Laterality Date  . CESAREAN SECTION N/A 10/08/2013   Procedure: CESAREAN SECTION;  Surgeon: Antionette Char, MD;  Location: [facility];  Service: Obstetrics;  Laterality: N/A;    Family History  Problem Relation Age of Onset  . Diabetes Mother   . Arthritis Mother   . Hypertension Mother   . Diabetes Maternal Grandmother   . Hypertension Maternal Grandmother   . Cancer Neg Hx   . Heart disease Neg Hx     Allergies  Allergen Reactions  . Hydralazine Itching  . Lasix [Furosemide] Swelling    Mouth and right side of face     No current outpatient medications on file prior to visit.   No current facility-administered medications on file prior to visit.     BP (!) 140/96   Pulse 79   Temp 98.1 F (36.7 C) (Oral)   Ht 4\' 11"  (1.499 m)   Wt 258 lb 8 oz (117.3 kg)   LMP 10/02/2018 (Approximate)   SpO2 99%   BMI 52.21 kg/m    Objective:   Physical Exam  Constitutional: She is oriented to person, place, and time. She appears well-nourished.  HENT:  Mouth/Throat: No oropharyngeal exudate.  Eyes: Pupils are equal, round, and reactive to light. EOM are normal.  Neck: Neck supple. No thyromegaly present.  Cardiovascular: Normal rate and regular rhythm.  Respiratory: Effort normal and breath sounds normal.  GI: Soft. Bowel sounds are normal. There is no abdominal tenderness.  Musculoskeletal: Normal range of motion.     Comments: 5/5 strength to bilateral lower extremities. Mild swelling to right lower extremity distal to knee, proximal to calf. Mild limp in the office today  Neurological: She is alert and oriented to person, place, and time.  Skin: Skin is warm and dry.  Psychiatric: She has a normal mood and affect.           Assessment & Plan:

## 2019-01-01 ENCOUNTER — Ambulatory Visit: Payer: BLUE CROSS/BLUE SHIELD | Admitting: Primary Care

## 2019-03-13 ENCOUNTER — Encounter: Payer: Self-pay | Admitting: Primary Care

## 2019-03-13 ENCOUNTER — Ambulatory Visit (INDEPENDENT_AMBULATORY_CARE_PROVIDER_SITE_OTHER): Payer: BC Managed Care – PPO | Admitting: Primary Care

## 2019-03-13 ENCOUNTER — Other Ambulatory Visit: Payer: Self-pay

## 2019-03-13 ENCOUNTER — Ambulatory Visit (INDEPENDENT_AMBULATORY_CARE_PROVIDER_SITE_OTHER)
Admission: RE | Admit: 2019-03-13 | Discharge: 2019-03-13 | Disposition: A | Discharge status: Home / Self Care | Payer: BC Managed Care – PPO | Source: Ambulatory Visit | Attending: Primary Care | Admitting: Primary Care

## 2019-03-13 VITALS — BP 148/96 | HR 97 | Temp 97.6°F | Ht 59.0 in | Wt 264.0 lb

## 2019-03-13 DIAGNOSIS — M79604 Pain in right leg: Secondary | ICD-10-CM

## 2019-03-13 DIAGNOSIS — I10 Essential (primary) hypertension: Secondary | ICD-10-CM | POA: Diagnosis not present

## 2019-03-13 DIAGNOSIS — M25561 Pain in right knee: Secondary | ICD-10-CM | POA: Diagnosis not present

## 2019-03-13 MED ORDER — HYDROCHLOROTHIAZIDE 12.5 MG PO TABS: 12.5000 mg | ORAL_TABLET | Freq: Every day | ORAL | 0 refills | Status: DC

## 2019-03-13 NOTE — Assessment & Plan Note (Signed)
Above goal on last two office visits, may be increased due to pain of her knee. Historically she has more elevated readings than not. Will resume HCTZ 12.5 mg. We will plan to see her back in 2-3 weeks for BP check.

## 2019-03-13 NOTE — Assessment & Plan Note (Signed)
Chronic since Fall 2019, plain films of tib/fib from January 2020 unremarkable. Plain films of the knee pending today. Referral placed to orthopedics for further evaluation.

## 2019-03-13 NOTE — Progress Notes (Signed)
Subjective:    Patient ID: Suzanne [name], female    DOB: [birth year], 33 y.o.   MRN: [id number]  HPI  Suzanne [name] is a 33 year old female with a history of hypertension, acute CHF, anemia, morbid obesity who presents today with a chief complaint of lower extremity pain and follow up.  1) Hypertension: Currently off all medications, most recently managed on HCTZ 12.5 mg, also Amlodipine 5 mg in 2016 and 2017. She has not taken her HCTZ in about one year. She does not check her BP at home.   BP Readings from Last 3 Encounters:  03/13/19 (!) 148/96  10/23/18 (!) 140/96  10/12/18 121/82   2) Chronic Lower Extremity Pain: Located to right knee and surround proximal and distal areas of knee that has been present since she feel in September 2019. She sits at a desk for most of her work day and will notice stiffness and pain when rising from a seated position. Her pain is also more severe when waking in the morning. She describes her pain as "tightness" can also feel that she is dragging her right leg at times. She denies re-injury, numbness/tingling, loss of bowel/bladder control.  Review of Systems  Respiratory: Negative for shortness of breath.   Musculoskeletal: Positive for arthralgias.       Chronic right knee and lower extremity pain  Skin: Negative for color change.  Neurological: Negative for dizziness.       Past Medical History:  Diagnosis Date  . Acid reflux   . Blood transfusion without reported diagnosis   . Hypertension   . Sickle cell trait ([place])      Social History   Socioeconomic History  . Marital status: Single    Spouse name: Not on file  . Number of children: Not on file  . Years of education: Not on file  . Highest education level: Not on file  Occupational History  . Not on file  Social Needs  . Financial resource strain: Not on file  . Food insecurity    Worry: Not on file    Inability: Not on file  . Transportation needs    Medical: Not on file   Non-medical: Not on file  Tobacco Use  . Smoking status: Never Smoker  . Smokeless tobacco: Never Used  Substance and Sexual Activity  . Alcohol use: No  . Drug use: No  . Sexual activity: Yes    Partners: Male    Birth control/protection: Condom  Lifestyle  . Physical activity    Days per week: Not on file    Minutes per session: Not on file  . Stress: Not on file  Relationships  . Social Herbalist on phone: Not on file    Gets together: Not on file    Attends religious service: Not on file    Active member of club or organization: Not on file    Attends meetings of clubs or organizations: Not on file    Relationship status: Not on file  . Intimate partner violence    Fear of current or ex partner: Not on file    Emotionally abused: Not on file    Physically abused: Not on file    Forced sexual activity: Not on file  Other Topics Concern  . Not on file  Social History Narrative   Completed some collage   Works at [employer]   Has one son, 1 years.  Enjoys playing with her son, sleeping.   Family is established here as well.    Past Surgical History:  Procedure Laterality Date  . CESAREAN SECTION N/A 10/08/2013   Procedure: CESAREAN SECTION;  Surgeon: Antionette Char, MD;  Location: [facility];  Service: Obstetrics;  Laterality: N/A;    Family History  Problem Relation Age of Onset  . Diabetes Mother   . Arthritis Mother   . Hypertension Mother   . Diabetes Maternal Grandmother   . Hypertension Maternal Grandmother   . Cancer Neg Hx   . Heart disease Neg Hx     Allergies  Allergen Reactions  . Hydralazine Itching  . Lasix [Furosemide] Swelling    Mouth and right side of face     No current outpatient medications on file prior to visit.   No current facility-administered medications on file prior to visit.     BP (!) 148/96   Pulse 97   Temp 97.6 F (36.4 C) (Tympanic)   Ht 4\' 11"  (1.499 m)   Wt 264 lb (119.7 kg)    LMP 03/02/2019   SpO2 99%   BMI 53.32 kg/m    Objective:   Physical Exam  Constitutional: She appears well-nourished.  Cardiovascular: Normal rate and regular rhythm.  Respiratory: Effort normal.  Musculoskeletal:     Right knee: She exhibits decreased range of motion. She exhibits no swelling and no bony tenderness. No tenderness found.     Comments: 4/5 strength to right lower extremity with extension, 5/5 strength with flexion.  Skin: Skin is warm and dry.           Assessment & Plan:

## 2019-03-13 NOTE — Patient Instructions (Signed)
Resume hydrochlorothiazide 12.5 mg tablets once daily for blood pressure.  Complete xray(s) prior to leaving today. I will notify you of your results once received.  You will be contacted regarding your referral to orthopedics.  Please let  us know if you have not been contacted within one week.   Schedule a follow up visit for blood pressure check for 2-3 weeks.  It was a pleasure to see you today!

## 2019-04-10 ENCOUNTER — Ambulatory Visit: Payer: BC Managed Care – PPO | Admitting: Orthopaedic Surgery

## 2019-04-10 ENCOUNTER — Ambulatory Visit: Payer: BC Managed Care – PPO | Admitting: Primary Care

## 2019-04-24 ENCOUNTER — Ambulatory Visit: Payer: BC Managed Care – PPO | Admitting: Orthopaedic Surgery

## 2019-05-08 ENCOUNTER — Ambulatory Visit: Payer: BC Managed Care – PPO | Admitting: Orthopedic Surgery

## 2019-05-08 ENCOUNTER — Ambulatory Visit: Payer: BC Managed Care – PPO | Admitting: Orthopaedic Surgery

## 2019-05-20 ENCOUNTER — Telehealth: Payer: Self-pay | Admitting: Primary Care

## 2019-05-20 NOTE — Telephone Encounter (Signed)
Orthopedic Surgery referral denied/closed. Consult Appt w/Dr. Ninfa Linden 04-10-2019 1PM, Pt called can only due every other Thursday in the morning, appt was changed to 05/08/19 with Dr Marlou Sa. "  "Dr Marlou Sa will be out of the office on 05/08/19 due to surgery. Pt needed this day due to work schedule, appt was moved to Dr Erlinda Hong. Same time & day". Patient was No Show" [facility].

## 2019-05-20 NOTE — Telephone Encounter (Signed)
Noted  

## 2019-10-21 IMAGING — DX DG TIBIA/FIBULA 2V*R*
2 series · 2 of 2 positions shown · non-contrast
Comparison: Knee radiographs 06/24/2018

CLINICAL DATA: Chronic knee pain after fall in early [REDACTED]

EXAM:
RIGHT TIBIA AND FIBULA - 2 VIEW

[tibia ap]
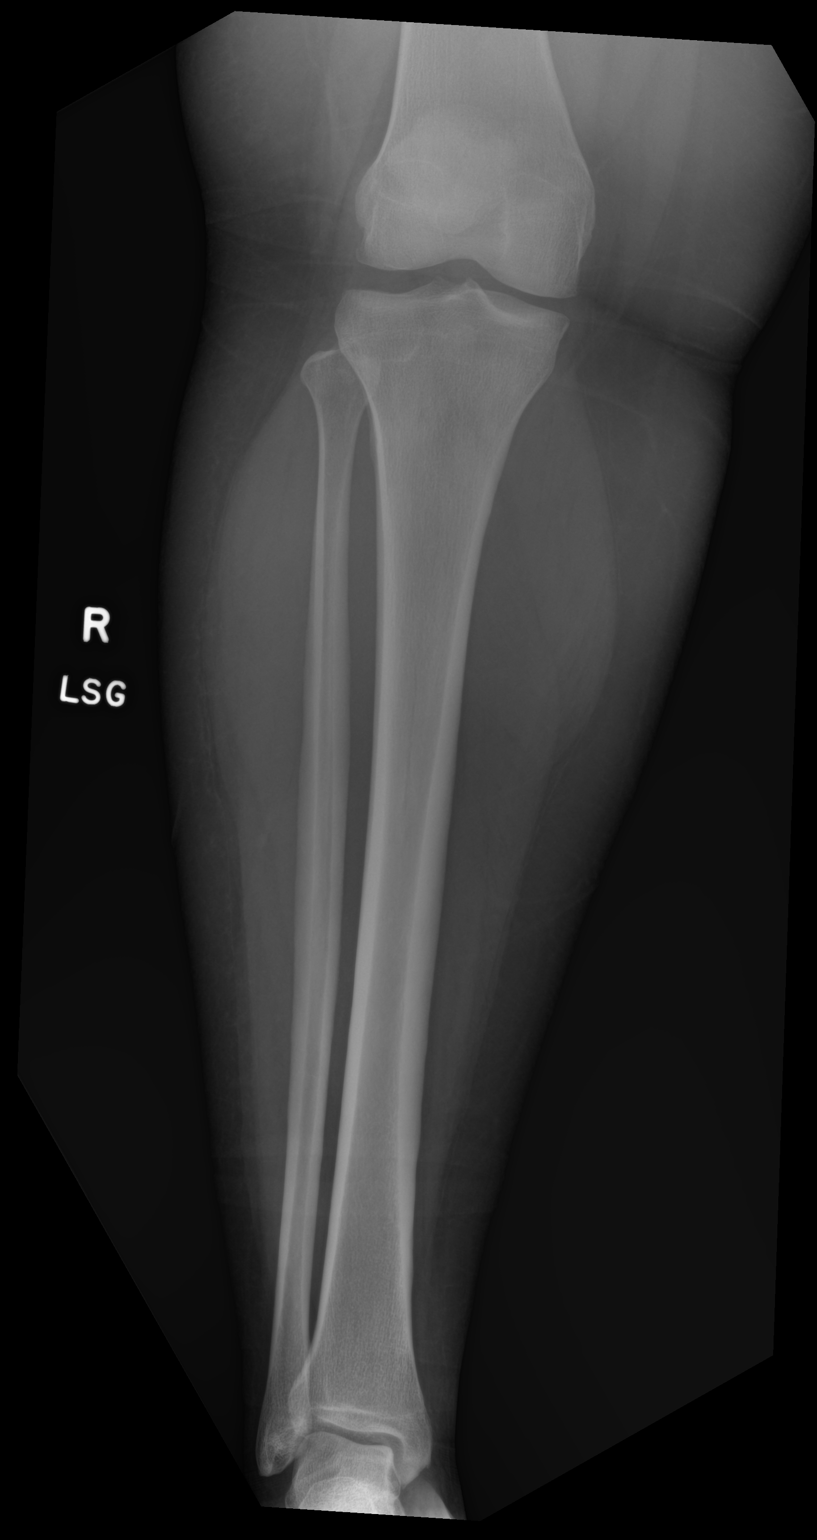

[tibia lat]
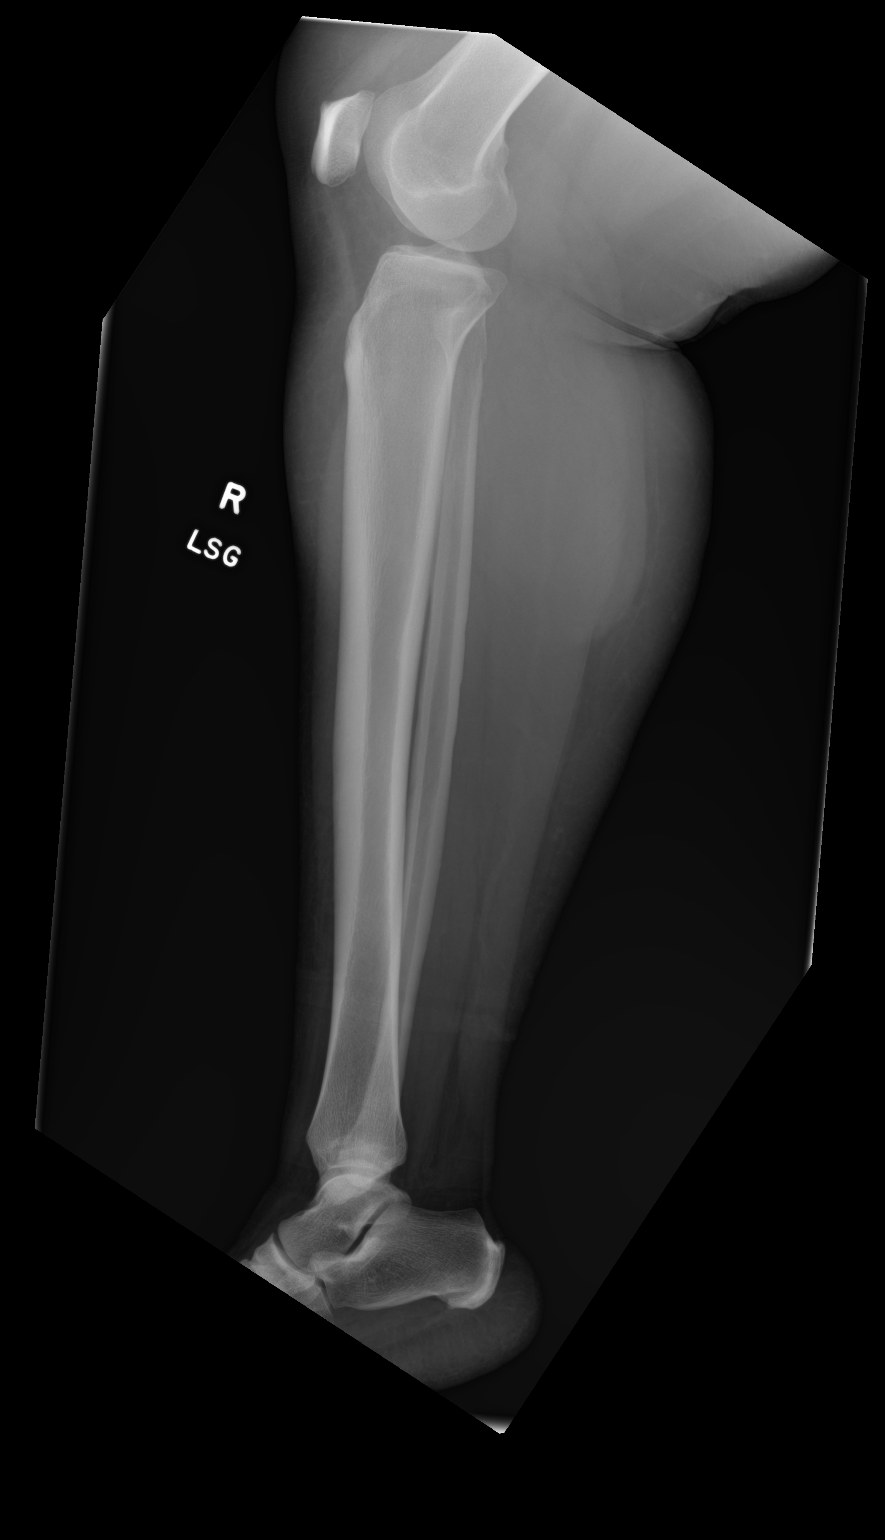

[2 of 2 positions shown; findings below may reference images not displayed]

FINDINGS: There is no evidence of fracture or other focal bone lesions. Soft
tissues are unremarkable. Intact knee and ankle joints. A sagittally
oriented lucency in the proximal to mid diaphysis of the tibia is
identified with sclerotic appearing margins. This likely represents
a small cleft or vascular channel. This is unchanged from prior. A
sagittal stress fracture would be somewhat unusual in this
orientation. Should symptoms persist, a bone scan may help for
further evaluation.
IMPRESSION: No acute displaced fracture or joint dislocations of the right tibia
and fibula.

## 2020-07-15 ENCOUNTER — Other Ambulatory Visit: Payer: Self-pay | Admitting: Primary Care

## 2020-07-15 DIAGNOSIS — I10 Essential (primary) hypertension: Secondary | ICD-10-CM

## 2020-08-01 ENCOUNTER — Other Ambulatory Visit: Payer: Self-pay | Admitting: Primary Care

## 2020-08-01 DIAGNOSIS — M79604 Pain in right leg: Secondary | ICD-10-CM

## 2020-08-01 DIAGNOSIS — D649 Anemia, unspecified: Secondary | ICD-10-CM

## 2020-08-01 DIAGNOSIS — I10 Essential (primary) hypertension: Secondary | ICD-10-CM

## 2020-08-12 ENCOUNTER — Other Ambulatory Visit: Payer: BC Managed Care – PPO

## 2020-08-17 ENCOUNTER — Encounter: Payer: BC Managed Care – PPO | Admitting: Primary Care

## 2020-10-03 ENCOUNTER — Other Ambulatory Visit: Payer: Self-pay | Admitting: Primary Care

## 2020-10-03 DIAGNOSIS — I10 Essential (primary) hypertension: Secondary | ICD-10-CM

## 2020-11-10 DIAGNOSIS — M79672 Pain in left foot: Secondary | ICD-10-CM | POA: Diagnosis not present

## 2020-11-10 DIAGNOSIS — M722 Plantar fascial fibromatosis: Secondary | ICD-10-CM | POA: Diagnosis not present

## 2020-11-10 DIAGNOSIS — M79671 Pain in right foot: Secondary | ICD-10-CM | POA: Diagnosis not present

## 2020-12-09 ENCOUNTER — Other Ambulatory Visit: Payer: Self-pay

## 2020-12-09 ENCOUNTER — Ambulatory Visit (HOSPITAL_COMMUNITY)
Admission: EM | Admit: 2020-12-09 | Discharge: 2020-12-09 | Disposition: A | Discharge status: Home / Self Care | Payer: Self-pay

## 2020-12-09 ENCOUNTER — Encounter (HOSPITAL_COMMUNITY): Payer: Self-pay | Admitting: Emergency Medicine

## 2020-12-09 DIAGNOSIS — O99511 Diseases of the respiratory system complicating pregnancy, first trimester: Secondary | ICD-10-CM

## 2020-12-09 DIAGNOSIS — R519 Headache, unspecified: Secondary | ICD-10-CM

## 2020-12-09 DIAGNOSIS — J301 Allergic rhinitis due to pollen: Secondary | ICD-10-CM

## 2020-12-09 DIAGNOSIS — I10 Essential (primary) hypertension: Secondary | ICD-10-CM

## 2020-12-09 DIAGNOSIS — Z3A01 Less than 8 weeks gestation of pregnancy: Secondary | ICD-10-CM

## 2020-12-09 NOTE — Discharge Instructions (Addendum)
-  Take Tylenol up to 1000 mg 3 times daily for headaches. -Also try Flonase, 1 to 2 sprays in each nostril 1-2 times daily. -Follow-up with your new women's health provider as scheduled tomorrow. -If you experience abdominal pain, vaginal spotting, worse headache of life, vision changes, dizziness, etc.-head straight to the ER.

## 2020-12-09 NOTE — ED Triage Notes (Addendum)
Headache has been intermittent for the past week.  Patient has found out this past week with a home pregnancy test that she is pregnant.  She stopped blood pressure medicines because of pregnancy, but scared to take blood pressure medicine.  Patient also references a recent stressful event.    Patient states she goes tomorrow to have official pregnancy test to start process of getting a provider  Patient reports front of head hurting

## 2020-12-09 NOTE — ED Provider Notes (Signed)
[facility]    CSN: [id number] Arrival date & time: 12/09/20  2694      History   Chief Complaint Chief Complaint  Patient presents with  . Headache    HPI Suzanne [name] is a 35 y.o. female presenting for headaches. History sickle cell trait, hypertension, blood transfusion, gerd. Positive pregnancy test 1 week ago; states last period was 2 months ago (10/09/2020).  Notes intermittent sinus headaches for 1 week associated with congestion.  Rates these as a 7 or 8 out of 10.  Has not tried anything for the since she does not know what she can take while pregnant.  States she has been on hydrochlorothiazide for hypertension for few years now, but stopped this 1 month ago when she started trying to get pregnant.  Recent increase in stressors due to passing a family member.  States she is establishing care with women's health provider tomorrow 3/18. Denies worst headache of life, thunderclap headache, weakness/sensation changes in arms/legs, vision changes, shortness of breath, chest pain/pressure, photophobia, phonophobia, n/v/d, dysuria, urnary frequency, hematuria, vaginal spotting, vaginal discharge, abdominal pain.  States she's been off of her hctz for 1 month.      HPI  Past Medical History:  Diagnosis Date  . Acid reflux   . Blood transfusion without reported diagnosis   . Hypertension   . Sickle cell trait [facility])     Patient Active Problem List   Diagnosis Date Noted  . Lower extremity pain 10/23/2018  . GERD (gastroesophageal reflux disease) 03/30/2016  . Morbid obesity with BMI of 50.0-59.9, adult (HCC) 01/14/2016  . Birth control 04/30/2015  . Preventative health care 01/06/2015  . Essential hypertension 12/17/2014  . Acute diastolic CHF (congestive heart failure) (HCC) 10/18/2013  . Anemia 10/12/2013  . SICKLE CELL TRAIT 11/22/2006    Past Surgical History:  Procedure Laterality Date  . CESAREAN SECTION N/A 10/08/2013   Procedure: CESAREAN SECTION;   Surgeon: Antionette Char, MD;  Location: [facility];  Service: Obstetrics;  Laterality: N/A;    OB History    Gravida  2   Para  1   Term  1   Preterm      AB  1   Living  1     SAB      IAB  1   Ectopic      Multiple      Live Births  1            Home Medications    Prior to Admission medications   Medication Sig Start Date End Date Taking? Authorizing Provider  hydrochlorothiazide (HYDRODIURIL) 12.5 MG tablet TAKE 1 TABLET BY MOUTH EVERY DAY FOR BLOOD PRESSURE 07/16/20  Yes Doreene Nest, NP    Family History Family History  Problem Relation Age of Onset  . Diabetes Mother   . Arthritis Mother   . Hypertension Mother   . Diabetes Maternal Grandmother   . Hypertension Maternal Grandmother   . Cancer Neg Hx   . Heart disease Neg Hx     Social History Social History   Tobacco Use  . Smoking status: Never Smoker  . Smokeless tobacco: Never Used  Vaping Use  . Vaping Use: Never used  Substance Use Topics  . Alcohol use: No  . Drug use: No     Allergies   Hydralazine and Lasix [furosemide]   Review of Systems Review of Systems  Constitutional: Negative for appetite change, chills, fatigue and fever.  HENT:  Positive for congestion and sinus pressure. Negative for sore throat, trouble swallowing and voice change.   Eyes: Negative for photophobia, pain, discharge, redness, itching and visual disturbance.  Respiratory: Negative for cough, chest tightness and shortness of breath.   Cardiovascular: Negative for chest pain, palpitations and leg swelling.  Gastrointestinal: Negative for abdominal pain, constipation, diarrhea, nausea and vomiting.  Genitourinary: Negative for dysuria, flank pain, frequency and urgency.  Musculoskeletal: Negative for back pain, gait problem, myalgias, neck pain and neck stiffness.  Neurological: Positive for headaches. Negative for dizziness, tremors, seizures, syncope, facial asymmetry, speech difficulty,  weakness, light-headedness and numbness.  Psychiatric/Behavioral: Negative for agitation, decreased concentration, dysphoric mood, hallucinations and suicidal ideas. The patient is not nervous/anxious.   All other systems reviewed and are negative.    Physical Exam Triage Vital Signs ED Triage Vitals  Enc Vitals Group     BP 12/09/20 0827 124/75     Pulse Rate 12/09/20 0827 93     Resp 12/09/20 0827 (!) 22     Temp 12/09/20 0827 98.6 F (37 C)     Temp Source 12/09/20 0827 Oral     SpO2 12/09/20 0827 100 %     Weight --      Height --      Head Circumference --      Peak Flow --      Pain Score 12/09/20 0821 8     Pain Loc --      Pain Edu? --      Excl. in GC? --    No data found.  Updated Vital Signs BP 124/75 (BP Location: Left Arm) Comment (BP Location): large cuff  Pulse 93   Temp 98.6 F (37 C) (Oral)   Resp (!) 22   LMP 10/09/2020   SpO2 100%   Visual Acuity Right Eye Distance:   Left Eye Distance:   Bilateral Distance:    Right Eye Near:   Left Eye Near:    Bilateral Near:     Physical Exam Vitals reviewed.  Constitutional:      General: She is not in acute distress.    Appearance: Normal appearance. She is obese. She is not ill-appearing.  HENT:     Head: Normocephalic and atraumatic.     Right Ear: Hearing, tympanic membrane, ear canal and external ear normal. No swelling or tenderness. There is no impacted cerumen. No mastoid tenderness. Tympanic membrane is not perforated, erythematous, retracted or bulging.     Left Ear: Hearing, tympanic membrane, ear canal and external ear normal. No swelling or tenderness. There is no impacted cerumen. No mastoid tenderness. Tympanic membrane is not perforated, erythematous, retracted or bulging.     Nose:     Right Sinus: No maxillary sinus tenderness or frontal sinus tenderness.     Left Sinus: No maxillary sinus tenderness or frontal sinus tenderness.     Mouth/Throat:     Mouth: Mucous membranes are  moist.     Pharynx: Uvula midline. No oropharyngeal exudate or posterior oropharyngeal erythema.     Tonsils: No tonsillar exudate.  Eyes:     Extraocular Movements: Extraocular movements intact.     Pupils: Pupils are equal, round, and reactive to light.  Cardiovascular:     Rate and Rhythm: Normal rate and regular rhythm.     Heart sounds: Normal heart sounds.  Pulmonary:     Breath sounds: Normal breath sounds and air entry. No wheezing, rhonchi or rales.  Chest:  Chest wall: No tenderness.  Abdominal:     General: Abdomen is flat. Bowel sounds are normal.     Tenderness: There is no abdominal tenderness. There is no guarding or rebound.  Lymphadenopathy:     Cervical: No cervical adenopathy.  Neurological:     General: No focal deficit present.     Mental Status: She is alert and oriented to person, place, and time.     Comments: CN 2-12 grossly intact  Psychiatric:        Attention and Perception: Attention and perception normal.        Mood and Affect: Mood and affect normal.        Behavior: Behavior normal. Behavior is cooperative.        Thought Content: Thought content normal.        Judgment: Judgment normal.      UC Treatments / Results  Labs (all labs ordered are listed, but only abnormal results are displayed) Labs Reviewed - No data to display  EKG   Radiology No results found.  Procedures Procedures (including critical care time)  Medications Ordered in UC Medications - No data to display  Initial Impression / Assessment and Plan / UC Course  I have reviewed the triage vital signs and the nursing notes.  Pertinent labs & imaging results that were available during my care of the patient were reviewed by me and considered in my medical decision making (see chart for details).      This patient is a 35 year old female presenting with sinus headaches. This patient is approximately [redacted] weeks pregnant. Has appt to establish care with OB tomorrow  12/10/2020.   For allergic rhinitis, trial of flonase and tylenol. She has these at home already and so declines prescription of this.  Blood pressure seems to be at goal on no medications. Continue this. rec discussing with OB.  Strict ED return precautions discussed.  This chart was dictated using voice recognition software, Dragon. Despite the best efforts of this provider to proofread and correct errors, errors may still occur which can change documentation meaning.   Final Clinical Impressions(s) / UC Diagnoses   Final diagnoses:  Sinus headache  Seasonal allergic rhinitis due to pollen  Less than [redacted] weeks gestation of pregnancy  Essential hypertension     Discharge Instructions     -Take Tylenol up to 1000 mg 3 times daily for headaches. -Also try Flonase, 1 to 2 sprays in each nostril 1-2 times daily. -Follow-up with your new women's health provider as scheduled tomorrow. -If you experience abdominal pain, vaginal spotting, worse headache of life, vision changes, dizziness, etc.-head straight to the ER.    ED Prescriptions    None     PDMP not reviewed this encounter.   Rhys Martini, PA-C 12/09/20 [phone]

## 2020-12-10 ENCOUNTER — Ambulatory Visit (INDEPENDENT_AMBULATORY_CARE_PROVIDER_SITE_OTHER): Payer: Self-pay

## 2020-12-10 DIAGNOSIS — Z3201 Encounter for pregnancy test, result positive: Secondary | ICD-10-CM

## 2020-12-10 DIAGNOSIS — Z3200 Encounter for pregnancy test, result unknown: Secondary | ICD-10-CM

## 2020-12-10 LAB — POCT URINE PREGNANCY: Preg Test, Ur: POSITIVE — AB

## 2020-12-10 NOTE — Progress Notes (Signed)
..   Ms. Brabson presents today for UPT. She has no unusual complaints. LMP:10/04/20    OBJECTIVE: Appears well, in no apparent distress.  OB History    Gravida  3   Para  1   Term  1   Preterm      AB  1   Living  1     SAB      IAB  1   Ectopic      Multiple      Live Births  1          Home UPT Result:Positive In-Office UPT result:Positive I have reviewed the patient's medical, obstetrical, social, and family histories, and medications.   ASSESSMENT: Positive pregnancy test  PLAN Prenatal care to be completed at: [facility]

## 2020-12-15 ENCOUNTER — Other Ambulatory Visit: Payer: Self-pay

## 2020-12-15 ENCOUNTER — Ambulatory Visit (INDEPENDENT_AMBULATORY_CARE_PROVIDER_SITE_OTHER): Payer: Self-pay

## 2020-12-15 VITALS — BP 138/86 | HR 98 | Ht 59.0 in | Wt 261.4 lb

## 2020-12-15 DIAGNOSIS — Z789 Other specified health status: Secondary | ICD-10-CM

## 2020-12-15 DIAGNOSIS — O219 Vomiting of pregnancy, unspecified: Secondary | ICD-10-CM

## 2020-12-15 DIAGNOSIS — Z3481 Encounter for supervision of other normal pregnancy, first trimester: Secondary | ICD-10-CM

## 2020-12-15 DIAGNOSIS — Z3491 Encounter for supervision of normal pregnancy, unspecified, first trimester: Secondary | ICD-10-CM | POA: Insufficient documentation

## 2020-12-15 DIAGNOSIS — O3680X0 Pregnancy with inconclusive fetal viability, not applicable or unspecified: Secondary | ICD-10-CM

## 2020-12-15 DIAGNOSIS — O0990 Supervision of high risk pregnancy, unspecified, unspecified trimester: Secondary | ICD-10-CM | POA: Insufficient documentation

## 2020-12-15 MED ORDER — PROMETHAZINE HCL 25 MG PO TABS: 25.0000 mg | ORAL_TABLET | Freq: Four times a day (QID) | ORAL | 2 refills | Status: DC | PRN

## 2020-12-15 NOTE — Progress Notes (Signed)
PRENATAL INTAKE SUMMARY  Ms. Asfour presents today New OB Nurse Interview.  OB History    Gravida  3   Para  1   Term  1   Preterm      AB  1   Living  1     SAB      IAB  1   Ectopic      Multiple      Live Births  1          I have reviewed the patient's medical, obstetrical, social, and family histories, medications, and available lab results.  SUBJECTIVE She has no unusual complaints  OBJECTIVE Initial Physical Exam (New OB)  GENERAL APPEARANCE: alert, well appearing   ASSESSMENT Normal pregnancy  PLAN Prenatal care to be completed completed at [facility] All New OB labs to be completed at [facility] Provider visit Baby Scripts Ordered Patient has a BP cuff at home U/S performed today reveals single live IUP at [redacted]w[redacted]d by CRL. FHR 182 PHQ2-9 score: 1 GAD 7 score: 5

## 2020-12-15 NOTE — Progress Notes (Signed)
Patient was assessed and managed by nursing staff during this encounter. I have reviewed the chart and agree with the documentation and plan. I have also made any necessary editorial changes.  Warden Fillers, MD 12/15/2020 9:21 AM

## 2020-12-29 ENCOUNTER — Other Ambulatory Visit (HOSPITAL_COMMUNITY)
Admission: RE | Admit: 2020-12-29 | Discharge: 2020-12-29 | Disposition: A | Discharge status: Home / Self Care | Payer: Self-pay | Source: Ambulatory Visit | Attending: Obstetrics | Admitting: Obstetrics

## 2020-12-29 ENCOUNTER — Ambulatory Visit (INDEPENDENT_AMBULATORY_CARE_PROVIDER_SITE_OTHER): Payer: Self-pay

## 2020-12-29 ENCOUNTER — Ambulatory Visit (INDEPENDENT_AMBULATORY_CARE_PROVIDER_SITE_OTHER): Payer: Self-pay | Admitting: Obstetrics

## 2020-12-29 ENCOUNTER — Encounter: Payer: Self-pay | Admitting: Obstetrics

## 2020-12-29 ENCOUNTER — Other Ambulatory Visit: Payer: Self-pay

## 2020-12-29 VITALS — BP 134/91 | HR 103 | Wt 259.0 lb

## 2020-12-29 DIAGNOSIS — O368390 Maternal care for abnormalities of the fetal heart rate or rhythm, unspecified trimester, not applicable or unspecified: Secondary | ICD-10-CM

## 2020-12-29 DIAGNOSIS — O09891 Supervision of other high risk pregnancies, first trimester: Secondary | ICD-10-CM

## 2020-12-29 DIAGNOSIS — Z3480 Encounter for supervision of other normal pregnancy, unspecified trimester: Secondary | ICD-10-CM | POA: Insufficient documentation

## 2020-12-29 DIAGNOSIS — O99210 Obesity complicating pregnancy, unspecified trimester: Secondary | ICD-10-CM

## 2020-12-29 DIAGNOSIS — Z3A10 10 weeks gestation of pregnancy: Secondary | ICD-10-CM

## 2020-12-29 MED ORDER — VITAFOL ULTRA 29-0.6-0.4-200 MG PO CAPS: 1.0000 | ORAL_CAPSULE | Freq: Every day | ORAL | 4 refills | Status: DC

## 2020-12-29 MED ORDER — ASPIRIN EC 81 MG PO TBEC: 81.0000 mg | DELAYED_RELEASE_TABLET | Freq: Every day | ORAL | 11 refills | Status: DC

## 2020-12-29 NOTE — Progress Notes (Signed)
Subjective:    Suzanne [name] is being seen today for her first obstetrical visit.  This is not a planned pregnancy. She is at [redacted]w[redacted]d gestation. Her obstetrical history is significant for obesity. Relationship with FOB: he is deceased. Patient does intend to breast feed. Pregnancy history fully reviewed.  The information documented in the HPI was reviewed and verified.  Menstrual History: OB History    Gravida  3   Para  1   Term  1   Preterm      AB  1   Living  1     SAB      IAB  1   Ectopic      Multiple      Live Births  1            Patient's last menstrual period was 10/17/2020.    Past Medical History:  Diagnosis Date  . Acid reflux   . Blood transfusion without reported diagnosis   . Hypertension   . Sickle cell trait [facility])     Past Surgical History:  Procedure Laterality Date  . CESAREAN SECTION N/A 10/08/2013   Procedure: CESAREAN SECTION;  Surgeon: Antionette Char, MD;  Location: [facility];  Service: Obstetrics;  Laterality: N/A;    (Not in a hospital admission)  Allergies  Allergen Reactions  . Hydralazine Itching  . Lasix [Furosemide] Swelling    Mouth and right side of face     Social History   Tobacco Use  . Smoking status: Never Smoker  . Smokeless tobacco: Never Used  Substance Use Topics  . Alcohol use: No    Family History  Problem Relation Age of Onset  . Diabetes Mother   . Arthritis Mother   . Hypertension Mother   . Diabetes Maternal Grandmother   . Hypertension Maternal Grandmother   . Cancer Neg Hx   . Heart disease Neg Hx      Review of Systems Constitutional: negative for weight loss Gastrointestinal: negative for vomiting Genitourinary:negative for genital lesions and vaginal discharge and dysuria Musculoskeletal:negative for back pain Behavioral/Psych: negative for abusive relationship, depression, illegal drug usage and tobacco use    Objective:    BP (!) 134/91   Pulse (!) 103   Wt 259 lb (117.5  kg)   LMP 10/17/2020   BMI 52.31 kg/m  General Appearance:    Alert, cooperative, no distress, appears stated age  Head:    Normocephalic, without obvious abnormality, atraumatic  Eyes:    PERRL, conjunctiva/corneas clear, EOM's intact, fundi    benign, both eyes  Ears:    Normal TM's and external ear canals, both ears  Nose:   Nares normal, septum midline, mucosa normal, no drainage    or sinus tenderness  Throat:   Lips, mucosa, and tongue normal; teeth and gums normal  Neck:   Supple, symmetrical, trachea midline, no adenopathy;    thyroid:  no enlargement/tenderness/nodules; no carotid   bruit or JVD  Back:     Symmetric, no curvature, ROM normal, no CVA tenderness  Lungs:     Clear to auscultation bilaterally, respirations unlabored  Chest Wall:    No tenderness or deformity   Heart:    Regular rate and rhythm, S1 and S2 normal, no murmur, rub   or gallop  Breast Exam:    No tenderness, masses, or nipple abnormality  Abdomen:     Soft, non-tender, bowel sounds active all four quadrants,    no masses, no organomegaly  Genitalia:    Normal female without lesion, discharge or tenderness  Extremities:   Extremities normal, atraumatic, no cyanosis or edema  Pulses:   2+ and symmetric all extremities  Skin:   Skin color, texture, turgor normal, no rashes or lesions  Lymph nodes:   Cervical, supraclavicular, and axillary nodes normal  Neurologic:   CNII-XII intact, normal strength, sensation and reflexes    throughout      Lab Review Urine pregnancy test Labs reviewed yes Radiologic studies reviewed no Assessment:    Pregnancy at [redacted]w[redacted]d weeks    Plan:      Prenatal vitamins.  Counseling provided regarding continued use of seat belts, cessation of alcohol consumption, smoking or use of illicit drugs; infection precautions i.e., influenza/TDAP immunizations, toxoplasmosis,CMV, parvovirus, listeria and varicella; workplace safety, exercise during pregnancy; routine dental care,  safe medications, sexual activity, hot tubs, saunas, pools, travel, caffeine use, fish and methlymercury, potential toxins, hair treatments, varicose veins Weight gain recommendations per IOM guidelines reviewed: underweight/BMI< 18.5--> gain 28 - 40 lbs; normal weight/BMI 18.5 - 24.9--> gain 25 - 35 lbs; overweight/BMI 25 - 29.9--> gain 15 - 25 lbs; obese/BMI >30->gain  11 - 20 lbs Problem list reviewed and updated. FIRST/CF mutation testing/NIPT/QUAD SCREEN/fragile X/Ashkenazi Jewish population testing/Spinal muscular atrophy discussed: requested. Role of ultrasound in pregnancy discussed; fetal survey: requested. Amniocentesis discussed: not indicated. VBAC calculator score: VBAC consent form provided  Orders Placed This Encounter  Procedures  . Culture, OB Urine  . CBC/D/Plt+RPR+Rh+ABO+Rub Ab...    Follow up in 4 weeks.  I have spent a total of 30 minutes of face-to-face time, excluding clinical staff time, reviewing notes and preparing to see patient, ordering tests and/or medications, and counseling the patient.   Brock Bad, MD 12/29/2020 1:43 PM

## 2020-12-29 NOTE — Progress Notes (Signed)
NOB  NOB intake completed 12/15/20  Last pap: 06/27/2016   Genetic Screening: Desires   CC: Fatigue , pt would like a different PNV.    Pt states she has B/P cuff at home.

## 2020-12-30 LAB — CBC/D/PLT+RPR+RH+ABO+RUB AB...
Antibody Screen: NEGATIVE
Basophils Absolute: 0 10*3/uL (ref 0.0–0.2)
Basos: 0 %
EOS (ABSOLUTE): 0.1 10*3/uL (ref 0.0–0.4)
Eos: 2 %
HCV Ab: <0.1 s/co ratio (ref 0.0–0.9)
HIV Screen 4th Generation wRfx: NONREACTIVE
Hematocrit: 39.3 % (ref 34.0–46.6)
Hemoglobin: 12.3 g/dL (ref 11.1–15.9)
Hepatitis B Surface Ag: NEGATIVE
Immature Grans (Abs): 0.1 10*3/uL (ref 0.0–0.1)
Immature Granulocytes: 1 %
Lymphocytes Absolute: 2.7 10*3/uL (ref 0.7–3.1)
Lymphs: 29 %
MCH: 22.7 pg — ABNORMAL LOW (ref 26.6–33.0)
MCHC: 31.3 g/dL — ABNORMAL LOW (ref 31.5–35.7)
MCV: 73 fL — ABNORMAL LOW (ref 79–97)
Monocytes Absolute: 0.8 10*3/uL (ref 0.1–0.9)
Monocytes: 9 %
Neutrophils Absolute: 5.6 10*3/uL (ref 1.4–7.0)
Neutrophils: 59 %
Platelets: 327 10*3/uL (ref 150–450)
RBC: 5.42 x10E6/uL — ABNORMAL HIGH (ref 3.77–5.28)
RDW: 15.7 % — ABNORMAL HIGH (ref 11.7–15.4)
RPR Ser Ql: NONREACTIVE
Rh Factor: POSITIVE
Rubella Antibodies, IGG: 2.83 index (ref >0.99)
WBC: 9.4 10*3/uL (ref 3.4–10.8)

## 2020-12-30 LAB — CERVICOVAGINAL ANCILLARY ONLY
Bacterial Vaginitis (gardnerella): POSITIVE — AB
Candida Glabrata: NEGATIVE
Candida Vaginitis: NEGATIVE
Chlamydia: NEGATIVE
Comment: NEGATIVE
Comment: NEGATIVE
Comment: NEGATIVE
Comment: NEGATIVE
Comment: NEGATIVE
Comment: NORMAL
Neisseria Gonorrhea: NEGATIVE
Trichomonas: NEGATIVE

## 2020-12-30 LAB — HCV INTERPRETATION

## 2020-12-31 ENCOUNTER — Other Ambulatory Visit: Payer: Self-pay | Admitting: Obstetrics

## 2020-12-31 DIAGNOSIS — N760 Acute vaginitis: Secondary | ICD-10-CM

## 2020-12-31 DIAGNOSIS — B9689 Other specified bacterial agents as the cause of diseases classified elsewhere: Secondary | ICD-10-CM

## 2020-12-31 LAB — CYTOLOGY - PAP
Comment: NEGATIVE
Diagnosis: NEGATIVE
High risk HPV: NEGATIVE

## 2020-12-31 LAB — CULTURE, OB URINE

## 2020-12-31 LAB — URINE CULTURE, OB REFLEX

## 2020-12-31 MED ORDER — METRONIDAZOLE 500 MG PO TABS: 500.0000 mg | ORAL_TABLET | Freq: Two times a day (BID) | ORAL | 2 refills | Status: DC

## 2021-01-03 ENCOUNTER — Ambulatory Visit (HOSPITAL_COMMUNITY)
Admission: EM | Admit: 2021-01-03 | Discharge: 2021-01-03 | Disposition: A | Discharge status: Home / Self Care | Payer: Medicaid Other | Attending: Student | Admitting: Student

## 2021-01-03 ENCOUNTER — Encounter (HOSPITAL_COMMUNITY): Payer: Self-pay

## 2021-01-03 ENCOUNTER — Other Ambulatory Visit: Payer: Self-pay

## 2021-01-03 DIAGNOSIS — G4489 Other headache syndrome: Secondary | ICD-10-CM

## 2021-01-03 DIAGNOSIS — Z3A01 Less than 8 weeks gestation of pregnancy: Secondary | ICD-10-CM

## 2021-01-03 DIAGNOSIS — O99511 Diseases of the respiratory system complicating pregnancy, first trimester: Secondary | ICD-10-CM

## 2021-01-03 DIAGNOSIS — J301 Allergic rhinitis due to pollen: Secondary | ICD-10-CM

## 2021-01-03 MED ORDER — LORATADINE 10 MG PO TABS: 10.0000 mg | ORAL_TABLET | Freq: Every day | ORAL | 2 refills | Status: DC

## 2021-01-03 NOTE — ED Triage Notes (Signed)
Pt in with c/o headache and coughing up mucus for the past few days  Pt has not taken medication for sxs

## 2021-01-03 NOTE — Discharge Instructions (Addendum)
-  Try Claritin (loratadine) once daily.  Try this for at least 1 week, if your symptoms worsen or persist you can try Zyrtec instead. -Come back and see [place] if your symptoms are getting worse instead of better, like new fever/chills, worsening of facial pressure, worsening of headaches, etc.

## 2021-01-03 NOTE — ED Provider Notes (Signed)
[facility]    CSN: [id number] Arrival date & time: 01/03/21  9833      History   Chief Complaint Chief Complaint  Patient presents with  . Headache  . Cough    HPI Suzanne [name] is a 35 y.o. female presenting with headaches and uncontrolled allergic rhinitis; requiring work note as she didn't go in to work today. Medical history migraine headaches, morbid obesity, essential hypertension, sickle cell trait. Was last seen at this urgent care for headaches 3 weeks ago. This patient is currently [redacted] weeks pregnant.  She endorses 2 weeks of nasal congestion with some intermittent throbbing headaches behind her forehead.  Describes these as 3 out of 10 headaches without vision changes, n/v/d.  Postnasal drip.  Endorses some coughing but states this is more to clear her throat from the postnasal drip. Has not tried any medications for her symptoms.  Denies fevers/chills, n/v/d, shortness of breath, chest pain, facial pain, teeth pain, sore throat, loss of taste/smell, swollen lymph nodes, ear pain. Denies worst headache of life, thunderclap headache, weakness/sensation changes in arms/legs, vision changes, shortness of breath, chest pain/pressure, photophobia, phonophobia, n/v/d. Denies urinary symptoms.    HPI  Past Medical History:  Diagnosis Date  . Acid reflux   . Blood transfusion without reported diagnosis   . Hypertension   . Sickle cell trait [facility])     Patient Active Problem List   Diagnosis Date Noted  . Encounter for supervision of normal pregnancy in first trimester 12/15/2020  . Lower extremity pain 10/23/2018  . GERD (gastroesophageal reflux disease) 03/30/2016  . Morbid obesity with BMI of 50.0-59.9, adult (HCC) 01/14/2016  . Birth control 04/30/2015  . Preventative health care 01/06/2015  . Essential hypertension 12/17/2014  . Acute diastolic CHF (congestive heart failure) (HCC) 10/18/2013  . Anemia 10/12/2013  . SICKLE CELL TRAIT 11/22/2006    Past  Surgical History:  Procedure Laterality Date  . CESAREAN SECTION N/A 10/08/2013   Procedure: CESAREAN SECTION;  Surgeon: Antionette Char, MD;  Location: [facility];  Service: Obstetrics;  Laterality: N/A;    OB History    Gravida  3   Para  1   Term  1   Preterm      AB  1   Living  1     SAB      IAB  1   Ectopic      Multiple      Live Births  1            Home Medications    Prior to Admission medications   Medication Sig Start Date End Date Taking? Authorizing Provider  loratadine (CLARITIN) 10 MG tablet Take 1 tablet (10 mg total) by mouth daily. 01/03/21  Yes Rhys Martini, PA-C  acetaminophen (TYLENOL) 325 MG tablet Take 650 mg by mouth every 6 (six) hours as needed.    [provider]  aspirin EC 81 MG tablet Take 1 tablet (81 mg total) by mouth daily. Swallow whole. 12/29/20   Brock Bad, MD  metroNIDAZOLE (FLAGYL) 500 MG tablet Take 1 tablet (500 mg total) by mouth 2 (two) times daily. 12/31/20   Brock Bad, MD  Prenat-Fe Poly-Methfol-FA-DHA (VITAFOL ULTRA) 29-0.6-0.4-200 MG CAPS Take 1 capsule by mouth daily before breakfast. 12/29/20   Brock Bad, MD  promethazine (PHENERGAN) 25 MG tablet Take 1 tablet (25 mg total) by mouth every 6 (six) hours as needed for nausea or vomiting. 12/15/20  Warden Fillers, MD  hydrochlorothiazide (HYDRODIURIL) 12.5 MG tablet TAKE 1 TABLET BY MOUTH EVERY DAY FOR BLOOD PRESSURE Patient not taking: No sig reported 07/16/20 01/03/21  Doreene Nest, NP    Family History Family History  Problem Relation Age of Onset  . Diabetes Mother   . Arthritis Mother   . Hypertension Mother   . Diabetes Maternal Grandmother   . Hypertension Maternal Grandmother   . Cancer Neg Hx   . Heart disease Neg Hx     Social History Social History   Tobacco Use  . Smoking status: Never Smoker  . Smokeless tobacco: Never Used  Vaping Use  . Vaping Use: Never used  Substance Use Topics  . Alcohol use: No   . Drug use: No     Allergies   Hydralazine and Lasix [furosemide]   Review of Systems Review of Systems  Constitutional: Negative for appetite change, chills and fever.  HENT: Positive for congestion. Negative for ear pain, rhinorrhea, sinus pressure, sinus pain and sore throat.   Eyes: Negative for redness and visual disturbance.  Respiratory: Negative for cough, chest tightness, shortness of breath and wheezing.   Cardiovascular: Negative for chest pain and palpitations.  Gastrointestinal: Negative for abdominal pain, constipation, diarrhea, nausea and vomiting.  Genitourinary: Negative for dysuria, frequency and urgency.  Musculoskeletal: Negative for myalgias.  Neurological: Positive for headaches. Negative for dizziness and weakness.  Psychiatric/Behavioral: Negative for confusion.  All other systems reviewed and are negative.    Physical Exam Triage Vital Signs ED Triage Vitals  Enc Vitals Group     BP      Pulse      Resp      Temp      Temp src      SpO2      Weight      Height      Head Circumference      Peak Flow      Pain Score      Pain Loc      Pain Edu?      Excl. in GC?    No data found.  Updated Vital Signs BP 129/82   Pulse (!) 104   Temp 98.5 F (36.9 C)   Resp 20   LMP 10/17/2020   SpO2 97%   Visual Acuity Right Eye Distance:   Left Eye Distance:   Bilateral Distance:    Right Eye Near:   Left Eye Near:    Bilateral Near:     Physical Exam Vitals reviewed.  Constitutional:      General: She is not in acute distress.    Appearance: Normal appearance. She is obese. She is not ill-appearing.  HENT:     Head: Normocephalic and atraumatic.     Right Ear: Hearing, tympanic membrane, ear canal and external ear normal. No swelling or tenderness. There is no impacted cerumen. No mastoid tenderness. Tympanic membrane is not perforated, erythematous, retracted or bulging.     Left Ear: Hearing, tympanic membrane, ear canal and external  ear normal. No swelling or tenderness. There is no impacted cerumen. No mastoid tenderness. Tympanic membrane is not perforated, erythematous, retracted or bulging.     Nose: Congestion present. No nasal tenderness.     Right Sinus: No maxillary sinus tenderness or frontal sinus tenderness.     Left Sinus: No maxillary sinus tenderness or frontal sinus tenderness.     Mouth/Throat:     Mouth: Mucous membranes are moist.  Pharynx: Uvula midline. No oropharyngeal exudate or posterior oropharyngeal erythema.     Tonsils: No tonsillar exudate.  Eyes:     Extraocular Movements: Extraocular movements intact.     Pupils: Pupils are equal, round, and reactive to light.  Cardiovascular:     Rate and Rhythm: Normal rate and regular rhythm.     Heart sounds: Normal heart sounds.  Pulmonary:     Breath sounds: Normal breath sounds and air entry. No wheezing, rhonchi or rales.  Chest:     Chest wall: No tenderness.  Abdominal:     General: Abdomen is flat. Bowel sounds are normal.     Tenderness: There is no abdominal tenderness. There is no guarding or rebound.  Lymphadenopathy:     Cervical: No cervical adenopathy.  Skin:    General: Skin is warm.     Capillary Refill: Capillary refill takes less than 2 seconds.  Neurological:     General: No focal deficit present.     Mental Status: She is alert and oriented to person, place, and time.  Psychiatric:        Attention and Perception: Attention and perception normal.        Mood and Affect: Mood and affect normal.        Behavior: Behavior normal. Behavior is cooperative.        Thought Content: Thought content normal.        Judgment: Judgment normal.      UC Treatments / Results  Labs (all labs ordered are listed, but only abnormal results are displayed) Labs Reviewed - No data to display  EKG   Radiology No results found.  Procedures Procedures (including critical care time)  Medications Ordered in UC Medications - No  data to display  Initial Impression / Assessment and Plan / UC Course  I have reviewed the triage vital signs and the nursing notes.  Pertinent labs & imaging results that were available during my care of the patient were reviewed by me and considered in my medical decision making (see chart for details).     This patient is a 36 year old female presenting with headaches, allergic rhinitis; she is [redacted] weeks pregnant. Today this pt is afebrile nontachycardic nontachypneic, oxygenating well on room air, no wheezes rhonchi or rales.  Has not tried any medications for her symptoms.  Trial of Claritin for allergic rhinitis, continue Tylenol for headaches. Sheet of medications that are safe in pregnancy provided today. She declines Covid and influenza test today. ED return precautions discussed.  Final Clinical Impressions(s) / UC Diagnoses   Final diagnoses:  Seasonal allergic rhinitis due to pollen  [redacted] weeks gestation of pregnancy  Allergic headache     Discharge Instructions     -Try Claritin (loratadine) once daily.  Try this for at least 1 week, if your symptoms worsen or persist you can try Zyrtec instead. -Come back and see [place] if your symptoms are getting worse instead of better, like new fever/chills, worsening of facial pressure, worsening of headaches, etc.    ED Prescriptions    Medication Sig Dispense Auth. Provider   loratadine (CLARITIN) 10 MG tablet Take 1 tablet (10 mg total) by mouth daily. 30 tablet Rhys Martini, PA-C     PDMP not reviewed this encounter.   Rhys Martini, PA-C 01/03/21 [phone]

## 2021-01-19 ENCOUNTER — Encounter (HOSPITAL_COMMUNITY): Payer: Self-pay

## 2021-01-19 ENCOUNTER — Ambulatory Visit (HOSPITAL_COMMUNITY)
Admission: EM | Admit: 2021-01-19 | Discharge: 2021-01-19 | Disposition: A | Discharge status: Home / Self Care | Payer: Self-pay

## 2021-01-19 DIAGNOSIS — M25562 Pain in left knee: Secondary | ICD-10-CM

## 2021-01-19 NOTE — ED Provider Notes (Signed)
[facility]    CSN: [id number] Arrival date & time: 01/19/21  0856      History   Chief Complaint Chief Complaint  Patient presents with  . Leg Pain    HPI Akshara L Sagen is a 35 y.o. female.   HPI   Leg Pain: Patient reports that for the past 2 weeks she has had more consistent left knee pain.  Pain is worse with walking and is located on her anterior and lateral knee.  She believes that she does have a history of having some arthritis in the opposite knee and is currently pregnant so she states that she likely has some increase in weight and symptoms of the left. She has not tried anything for symptoms. No fevers, increased warmth, rash, SOB, calf pain.   Past Medical History:  Diagnosis Date  . Acid reflux   . Blood transfusion without reported diagnosis   . Hypertension   . Sickle cell trait [facility])     Patient Active Problem List   Diagnosis Date Noted  . Encounter for supervision of normal pregnancy in first trimester 12/15/2020  . Lower extremity pain 10/23/2018  . GERD (gastroesophageal reflux disease) 03/30/2016  . Morbid obesity with BMI of 50.0-59.9, adult (HCC) 01/14/2016  . Birth control 04/30/2015  . Preventative health care 01/06/2015  . Essential hypertension 12/17/2014  . Acute diastolic CHF (congestive heart failure) (HCC) 10/18/2013  . Anemia 10/12/2013  . SICKLE CELL TRAIT 11/22/2006    Past Surgical History:  Procedure Laterality Date  . CESAREAN SECTION N/A 10/08/2013   Procedure: CESAREAN SECTION;  Surgeon: Antionette Char, MD;  Location: [facility];  Service: Obstetrics;  Laterality: N/A;    OB History    Gravida  3   Para  1   Term  1   Preterm      AB  1   Living  1     SAB      IAB  1   Ectopic      Multiple      Live Births  1            Home Medications    Prior to Admission medications   Medication Sig Start Date End Date Taking? Authorizing Provider  acetaminophen (TYLENOL) 325 MG tablet Take  650 mg by mouth every 6 (six) hours as needed.    [provider]  aspirin EC 81 MG tablet Take 1 tablet (81 mg total) by mouth daily. Swallow whole. 12/29/20   Brock Bad, MD  loratadine (CLARITIN) 10 MG tablet Take 1 tablet (10 mg total) by mouth daily. 01/03/21   Rhys Martini, PA-C  metroNIDAZOLE (FLAGYL) 500 MG tablet Take 1 tablet (500 mg total) by mouth 2 (two) times daily. 12/31/20   Brock Bad, MD  Prenat-Fe Poly-Methfol-FA-DHA (VITAFOL ULTRA) 29-0.6-0.4-200 MG CAPS Take 1 capsule by mouth daily before breakfast. 12/29/20   Brock Bad, MD  promethazine (PHENERGAN) 25 MG tablet Take 1 tablet (25 mg total) by mouth every 6 (six) hours as needed for nausea or vomiting. 12/15/20   Warden Fillers, MD  hydrochlorothiazide (HYDRODIURIL) 12.5 MG tablet TAKE 1 TABLET BY MOUTH EVERY DAY FOR BLOOD PRESSURE Patient not taking: No sig reported 07/16/20 01/03/21  Doreene Nest, NP    Family History Family History  Problem Relation Age of Onset  . Diabetes Mother   . Arthritis Mother   . Hypertension Mother   . Diabetes Maternal Grandmother   .  Hypertension Maternal Grandmother   . Cancer Neg Hx   . Heart disease Neg Hx     Social History Social History   Tobacco Use  . Smoking status: Never Smoker  . Smokeless tobacco: Never Used  Vaping Use  . Vaping Use: Never used  Substance Use Topics  . Alcohol use: No  . Drug use: No     Allergies   Hydralazine and Lasix [furosemide]   Review of Systems Review of Systems  As stated above in HPI Physical Exam Triage Vital Signs ED Triage Vitals  Enc Vitals Group     BP 01/19/21 0953 127/69     Pulse Rate 01/19/21 0953 96     Resp 01/19/21 0953 19     Temp 01/19/21 0953 98.3 F (36.8 C)     Temp Source 01/19/21 0953 Oral     SpO2 01/19/21 0953 100 %     Weight --      Height --      Head Circumference --      Peak Flow --      Pain Score 01/19/21 0952 8     Pain Loc --      Pain Edu? --       Excl. in GC? --    No data found.  Updated Vital Signs BP 127/69 (BP Location: Right Wrist)   Pulse 96   Temp 98.3 F (36.8 C) (Oral)   Resp 19   LMP 10/17/2020   SpO2 100%   Physical Exam Vitals and nursing note reviewed.  Constitutional:      General: She is not in acute distress.    Appearance: Normal appearance. She is obese. She is not ill-appearing, toxic-appearing or diaphoretic.  Musculoskeletal:        General: Swelling (Mild anterior and superior) present. Normal range of motion.     Right lower leg: No edema.     Left lower leg: No edema.     Comments: Mild to moderate crepitus of the left knee. ROM intact. No increased warmth of the knee. No tenderness to palpation throughout. Negative Homans sign.   Skin:    General: Skin is warm.     Capillary Refill: Capillary refill takes less than 2 seconds.     Coloration: Skin is not jaundiced or pale.     Findings: No bruising, erythema, lesion or rash.  Neurological:     Mental Status: She is alert and oriented to person, place, and time.     Cranial Nerves: No cranial nerve deficit.     Sensory: No sensory deficit.     Motor: No weakness.     Coordination: Coordination normal.     Gait: Gait normal.     Deep Tendon Reflexes: Reflexes normal.      UC Treatments / Results  Labs (all labs ordered are listed, but only abnormal results are displayed) Labs Reviewed - No data to display  EKG   Radiology No results found.  Procedures Procedures (including critical care time)  Medications Ordered in UC Medications - No data to display  Initial Impression / Assessment and Plan / UC Course  I have reviewed the triage vital signs and the nursing notes.  Pertinent labs & imaging results that were available during my care of the patient were reviewed by me and considered in my medical decision making (see chart for details).     New.  I have recommended cold compress, patellar brace and Tylenol as needed for  symptoms given her current pregnancy along with supportive walking shoes. Discussed red flag signs and symptoms.  Final Clinical Impressions(s) / UC Diagnoses   Final diagnoses:  None   Discharge Instructions   None    ED Prescriptions    None     PDMP not reviewed this encounter.   Rushie Chestnut, [place] 01/19/21 1125

## 2021-01-19 NOTE — ED Triage Notes (Signed)
Pt reports left leg pain x 2 weeks. Pain is worse when walking. Denies any injury, fall. Pt has no taken any OTC meds for pain.

## 2021-01-19 NOTE — Discharge Instructions (Addendum)
No NSAID medications such as aspirin, ibuprofen, aleve

## 2021-01-29 ENCOUNTER — Other Ambulatory Visit: Payer: Self-pay | Admitting: Physician Assistant

## 2021-01-29 ENCOUNTER — Ambulatory Visit (HOSPITAL_COMMUNITY): Admission: RE | Admit: 2021-01-29 | Payer: Medicaid Other | Source: Ambulatory Visit

## 2021-01-29 DIAGNOSIS — M79605 Pain in left leg: Secondary | ICD-10-CM

## 2021-01-29 DIAGNOSIS — M25562 Pain in left knee: Secondary | ICD-10-CM | POA: Diagnosis not present

## 2021-02-02 ENCOUNTER — Ambulatory Visit (INDEPENDENT_AMBULATORY_CARE_PROVIDER_SITE_OTHER): Payer: Medicaid Other | Admitting: Women's Health

## 2021-02-02 ENCOUNTER — Telehealth: Payer: Self-pay

## 2021-02-02 ENCOUNTER — Other Ambulatory Visit: Payer: Self-pay

## 2021-02-02 VITALS — BP 125/82 | HR 113 | Wt 265.8 lb

## 2021-02-02 DIAGNOSIS — Z3481 Encounter for supervision of other normal pregnancy, first trimester: Secondary | ICD-10-CM

## 2021-02-02 DIAGNOSIS — I10 Essential (primary) hypertension: Secondary | ICD-10-CM

## 2021-02-02 DIAGNOSIS — Z98891 History of uterine scar from previous surgery: Secondary | ICD-10-CM | POA: Insufficient documentation

## 2021-02-02 DIAGNOSIS — Z3401 Encounter for supervision of normal first pregnancy, first trimester: Secondary | ICD-10-CM

## 2021-02-02 DIAGNOSIS — Z3A15 15 weeks gestation of pregnancy: Secondary | ICD-10-CM

## 2021-02-02 DIAGNOSIS — D573 Sickle-cell trait: Secondary | ICD-10-CM

## 2021-02-02 DIAGNOSIS — Z36 Encounter for antenatal screening for chromosomal anomalies: Secondary | ICD-10-CM | POA: Diagnosis not present

## 2021-02-02 DIAGNOSIS — K117 Disturbances of salivary secretion: Secondary | ICD-10-CM

## 2021-02-02 DIAGNOSIS — Z6841 Body Mass Index (BMI) 40.0 and over, adult: Secondary | ICD-10-CM

## 2021-02-02 DIAGNOSIS — I5031 Acute diastolic (congestive) heart failure: Secondary | ICD-10-CM

## 2021-02-02 HISTORY — DX: History of uterine scar from previous surgery: Z98.891

## 2021-02-02 MED ORDER — GLYCOPYRROLATE 1 MG PO TABS
1.0000 mg | ORAL_TABLET | Freq: Three times a day (TID) | ORAL | 1 refills | Status: DC
Start: 2021-02-02 — End: 2021-03-02

## 2021-02-02 NOTE — Telephone Encounter (Signed)
Order place for ultrasound

## 2021-02-02 NOTE — Addendum Note (Signed)
Addended by: Cheree Ditto, Edvardo Honse A on: 02/02/2021 02:41 PM   Modules accepted: Orders

## 2021-02-02 NOTE — Progress Notes (Signed)
Subjective:  Suzanne [name] is a 35 y.o. G3P1011 at [redacted]w[redacted]d being seen today for ongoing prenatal care.  She is currently monitored for the following issues for this high-risk pregnancy and has SICKLE CELL TRAIT; Ptyalism; Acute diastolic CHF (congestive heart failure) (HCC); Essential hypertension; Morbid obesity with BMI of 50.0-59.9, adult (HCC); Encounter for supervision of normal pregnancy in first trimester; and History of C-section on their problem list.  Patient reports no complaints.  Contractions: Not present. Vag. Bleeding: None.  Movement: Present. Denies leaking of fluid.   The following portions of the patient's history were reviewed and updated as appropriate: allergies, current medications, past family history, past medical history, past social history, past surgical history and problem list. Problem list updated.  Objective:   Vitals:   02/02/21 0849  BP: 125/82  Pulse: (!) 113  Weight: 265 lb 12.8 oz (120.6 kg)    Fetal Status: Fetal Heart Rate (bpm): 155   Movement: Present     General:  Alert, oriented and cooperative. Patient is in no acute distress.  Skin: Skin is warm and dry. No rash noted.   Cardiovascular: Normal heart rate noted  Respiratory: Normal respiratory effort, no problems with respiration noted  Abdomen: Soft, gravid, appropriate for gestational age. Pain/Pressure: Absent     Pelvic: Vag. Bleeding: None     Cervical exam deferred        Extremities: Normal range of motion.  Edema: None  Mental Status: Normal mood and affect. Normal behavior. Normal judgment and thought content.   Urinalysis:      Assessment and Plan:  Pregnancy: G3P1011 at [redacted]w[redacted]d  1. Encounter for supervision of other normal pregnancy in first trimester -discussed contraception, pt elects BTL vs. IUD, info given -genetic testing ordered -AFP today  2. Essential hypertension -BP today 125/82  3. Acute diastolic CHF (congestive heart failure) (HCC) -added to problem list 2015,  not addressed at [facility] visit, will send to [facility] for further evaluation -happened within one week of delivery, along with blood transfusion, pulmonary edema and infection at C/S site  4. SICKLE CELL TRAIT -urine culture qtrimester, normal culture at [facility]  5. Morbid obesity with BMI of 50.0-59.9, adult (HCC) -on low dose ASA  6. [redacted] weeks gestation of pregnancy  7. History of C-section -desires VBAC  8. Ptyalism - glycopyrrolate (ROBINUL) 1 MG tablet; Take 1 tablet (1 mg total) by mouth 3 (three) times daily.  Dispense: 30 tablet; Refill: 1   Preterm labor symptoms and general obstetric precautions including but not limited to vaginal bleeding, contractions, leaking of fluid and fetal movement were reviewed in detail with the patient. I discussed the assessment and treatment plan with the patient. The patient was provided an opportunity to ask questions and all were answered. The patient agreed with the plan and demonstrated an understanding of the instructions. The patient was advised to call back or seek an in-person office evaluation/[facility] for any urgent or concerning symptoms. Please refer to After Visit Summary for other counseling recommendations.  Return in about 4 weeks (around 03/02/2021) for in-person HOB/MD ONLY, needs appt with OB cardio, needs anatomy scan scheduled.   Tani Virgo, Odie Sera, NP

## 2021-02-02 NOTE — Patient Instructions (Addendum)
Maternity Assessment Unit (MAU)  The Maternity Assessment Unit (MAU) is located at the [facility] and [place] at [facility]. The address is: [address], [name], [place],  [address]. Please see map below for additional directions.    The Maternity Assessment Unit is designed to help you during your pregnancy, and for up to 6 weeks after delivery, with any pregnancy- or postpartum-related emergencies, if you think you are in labor, or if your water has broken. For example, if you experience nausea and vomiting, vaginal bleeding, severe abdominal or pelvic pain, elevated blood pressure or other problems related to your pregnancy or postpartum time, please come to the Maternity Assessment Unit for assistance.        Contraception Choices - www.bedsider.org Contraception, also called birth control, refers to methods or devices that prevent pregnancy. Hormonal methods Contraceptive implant A contraceptive implant is a thin, plastic tube that contains a hormone that prevents pregnancy. It is different from an intrauterine device (IUD). It is inserted into the upper part of the arm by a health care provider. Implants can be effective for up to 3 years. Progestin-only injections Progestin-only injections are injections of progestin, a synthetic form of the hormone progesterone. They are given every 3 months by a health care provider. Birth control pills Birth control pills are pills that contain hormones that prevent pregnancy. They must be taken once a day, preferably at the same time each day. A prescription is needed to use this method of contraception. Birth control patch The birth control patch contains hormones that prevent pregnancy. It is placed on the skin and must be changed once a week for three weeks and removed on the fourth week. A prescription is needed to use this method of contraception. Vaginal ring A vaginal ring contains hormones that prevent  pregnancy. It is placed in the vagina for three weeks and removed on the fourth week. After that, the process is repeated with a new ring. A prescription is needed to use this method of contraception. Emergency contraceptive Emergency contraceptives prevent pregnancy after unprotected sex. They come in pill form and can be taken up to 5 days after sex. They work best the sooner they are taken after having sex. Most emergency contraceptives are available without a prescription. This method should not be used as your only form of birth control.   Barrier methods Female condom A female condom is a thin sheath that is worn over the penis during sex. Condoms keep sperm from going inside a woman's body. They can be used with a sperm-killing substance (spermicide) to increase their effectiveness. They should be thrown away after one use. Female condom A female condom is a soft, loose-fitting sheath that is put into the vagina before sex. The condom keeps sperm from going inside a woman's body. They should be thrown away after one use. Diaphragm A diaphragm is a soft, dome-shaped barrier. It is inserted into the vagina before sex, along with a spermicide. The diaphragm blocks sperm from entering the uterus, and the spermicide kills sperm. A diaphragm should be left in the vagina for 6-8 hours after sex and removed within 24 hours. A diaphragm is prescribed and fitted by a health care provider. A diaphragm should be replaced every 1-2 years, after giving birth, after gaining more than 15 lb (6.8 kg), and after pelvic surgery. Cervical cap A cervical cap is a round, soft latex or plastic cup that fits over the cervix. It is inserted into the  vagina before sex, along with spermicide. It blocks sperm from entering the uterus. The cap should be left in place for 6-8 hours after sex and removed within 48 hours. A cervical cap must be prescribed and fitted by a health care provider. It should be replaced every 2  years. Sponge A sponge is a soft, circular piece of polyurethane foam with spermicide in it. The sponge helps block sperm from entering the uterus, and the spermicide kills sperm. To use it, you make it wet and then insert it into the vagina. It should be inserted before sex, left in for at least 6 hours after sex, and removed and thrown away within 30 hours. Spermicides Spermicides are chemicals that kill or block sperm from entering the cervix and uterus. They can come as a cream, jelly, suppository, foam, or tablet. A spermicide should be inserted into the vagina with an applicator at least 18-29 minutes before sex to allow time for it to work. The process must be repeated every time you have sex. Spermicides do not require a prescription.   Intrauterine contraception Intrauterine device (IUD) An IUD is a T-shaped device that is put in a woman's uterus. There are two types:  Hormone IUD.This type contains progestin, a synthetic form of the hormone progesterone. This type can stay in place for 3-5 years.  Copper IUD.This type is wrapped in copper wire. It can stay in place for 10 years. Permanent methods of contraception Female tubal ligation In this method, a woman's fallopian tubes are sealed, tied, or blocked during surgery to prevent eggs from traveling to the uterus. Hysteroscopic sterilization In this method, a small, flexible insert is placed into each fallopian tube. The inserts cause scar tissue to form in the fallopian tubes and block them, so sperm cannot reach an egg. The procedure takes about 3 months to be effective. Another form of birth control must be used during those 3 months. Female sterilization This is a procedure to tie off the tubes that carry sperm (vasectomy). After the procedure, the man can still ejaculate fluid (semen). Another form of birth control must be used for 3 months after the procedure. Natural planning methods Natural family planning In this method, a  couple does not have sex on days when the woman could become pregnant. Calendar method In this method, the woman keeps track of the length of each menstrual cycle, identifies the days when pregnancy can happen, and does not have sex on those days. Ovulation method In this method, a couple avoids sex during ovulation. Symptothermal method This method involves not having sex during ovulation. The woman typically checks for ovulation by watching changes in her temperature and in the consistency of cervical mucus. Post-ovulation method In this method, a couple waits to have sex until after ovulation. Where to find more information  Centers for Disease Control and Prevention: http://www.wolf.info/ Summary  Contraception, also called birth control, refers to methods or devices that prevent pregnancy.  Hormonal methods of contraception include implants, injections, pills, patches, vaginal rings, and emergency contraceptives.  Barrier methods of contraception can include female condoms, female condoms, diaphragms, cervical caps, sponges, and spermicides.  There are two types of IUDs (intrauterine devices). An IUD can be put in a woman's uterus to prevent pregnancy for 3-5 years.  Permanent sterilization can be done through a procedure for males and females. Natural family planning methods involve nothaving sex on days when the woman could become pregnant. This information is not intended to replace advice given  to you by your health care provider. Make sure you discuss any questions you have with your health care provider. Document Revised: 02/16/2020 Document Reviewed: 02/16/2020 Elsevier Patient Education  2021 Elsevier Inc.        Hypertension During Pregnancy High blood pressure (hypertension) is when the force of blood pumping through the arteries is high enough to cause problems with your health. Arteries are blood vessels that carry blood from the heart throughout the body. Hypertension during  pregnancy can cause problems for you and your baby. It can be mild or severe. There are different types of hypertension that can happen during pregnancy. These include:  Chronic hypertension. This happens when you had high blood pressure before you became pregnant, and it continues during the pregnancy. Hypertension that develops before you are [redacted] weeks pregnant and continues during the pregnancy is also called chronic hypertension. If you have chronic hypertension, it will not go away after you have your baby. You will need follow-up visits with your health care provider after you have your baby. Your health care provider may want you to keep taking medicine for your blood pressure.  Gestational hypertension. This is hypertension that develops after the 20th week of pregnancy. Gestational hypertension usually goes away after you have your baby, but your health care provider will need to monitor your blood pressure to make sure that it is getting better.  Postpartum hypertension. This is high blood pressure that was present before delivery and continues after delivery or that starts after delivery. This usually occurs within 48 hours after childbirth but may occur up to 6 weeks after giving birth. When hypertension during pregnancy is severe, it is a medical emergency that requires treatment right away. How does this affect me? Women who have hypertension during pregnancy have a greater chance of developing hypertension later in life or during future pregnancies. In some cases, hypertension during pregnancy can cause serious complications, such as:  Stroke.  Heart attack.  Injury to other organs, such as kidneys, lungs, or liver.  Preeclampsia.  A condition called hemolysis, elevated liver enzymes, and low platelet count (HELLP) syndrome.  Convulsions or seizures.  Placental abruption. How does this affect my baby? Hypertension during pregnancy can affect your baby. Your baby may:  Be  born early (prematurely).  Not weigh as much as he or she should at birth (low birth weight).  Not tolerate labor well, leading to an unplanned cesarean delivery. This condition may also result in a baby's death before birth (stillbirth). What are the risks? There are certain factors that make it more likely for you to develop hypertension during pregnancy. These include:  Having hypertension during a previous pregnancy or a family history of hypertension.  Being overweight.  Being age 20 or older.  Being pregnant for the first time.  Being pregnant with more than one baby.  Becoming pregnant using fertilization methods, such as IVF (in vitro fertilization).  Having other medical problems, such as diabetes, kidney disease, or lupus. What can I do to lower my risk? The exact cause of hypertension during pregnancy is not known. You may be able to lower your risk by:  Maintaining a healthy weight.  Eating a healthy and balanced diet.  Following your health care provider's instructions about treating any long-term conditions that you had before becoming pregnant. It is very important to keep all of your prenatal care appointments. Your health care provider will check your blood pressure and make sure that your pregnancy is progressing  as expected. If a problem is found, early treatment can prevent complications.   How is this treated? Treatment for hypertension during pregnancy varies depending on the type of hypertension you have and how serious it is.  If you were taking medicine for high blood pressure before you became pregnant, talk with your health care provider. You may need to change medicine during pregnancy because some medicines, like ACE inhibitors, may not be considered safe for your baby.  If you have gestational hypertension, your health care provider may order medicine to treat this during pregnancy.  If you are at risk for preeclampsia, your health care provider may  recommend that you take a low-dose aspirin during your pregnancy.  If you have severe hypertension, you may need to be hospitalized so you and your baby can be monitored closely. You may also need to be given medicine to lower your blood pressure.  In some cases, if your condition gets worse, you may need to deliver your baby early. Follow these instructions at home: Eating and drinking  Drink enough fluid to keep your urine pale yellow.  Avoid caffeine.   Lifestyle  Do not use any products that contain nicotine or tobacco. These products include cigarettes, chewing tobacco, and vaping devices, such as e-cigarettes. If you need help quitting, ask your health care provider.  Do not use alcohol or drugs.  Avoid stress as much as possible.  Rest and get plenty of sleep.  Regular exercise can help to reduce your blood pressure. Ask your health care provider what kinds of exercise are best for you. General instructions  Take over-the-counter and prescription medicines only as told by your health care provider.  Keep all prenatal and follow-up visits. This is important. Contact a health care provider if:  You have symptoms that your health care provider told you may require more treatment or monitoring, such as: ? Headaches. ? Nausea or vomiting. ? Abdominal pain. ? Dizziness. ? Light-headedness. Get help right away if:  You have symptoms of serious complications, such as: ? Severe abdominal pain that does not get better with treatment. ? A severe headache that does not get better, blurred vision, or double vision. ? Vomiting that does not get better. ? Sudden, rapid weight gain or swelling in your hands, ankles, or face. ? Vaginal bleeding. ? Blood in your urine. ? Shortness of breath or chest pain. ? Weakness on one side of your body or difficulty speaking.  Your baby is not moving as much as usual. These symptoms may represent a serious problem that is an emergency. Do  not wait to see if the symptoms will go away. Get medical help right away. Call your local emergency services (911 in the [place] Do not drive yourself to the hospital. Summary  Hypertension during pregnancy can cause problems for you and your baby.  Treatment for hypertension during pregnancy varies depending on the type of hypertension you have and how serious it is.  Keep all prenatal and follow-up visits. This is important.  Get help right away if you have symptoms of serious complications related to high blood pressure. This information is not intended to replace advice given to you by your health care provider. Make sure you discuss any questions you have with your health care provider. Document Revised: 06/03/2020 Document Reviewed: 06/03/2020 Elsevier Patient Education  2021 Elsevier Inc.        Preeclampsia and Eclampsia Preeclampsia is a serious condition that may develop during pregnancy. This condition  involves high blood pressure during pregnancy and causes symptoms such as headaches, vision changes, and increased swelling in the legs, hands, and face. Preeclampsia occurs after 20 weeks of pregnancy. Eclampsia is a seizure that happens from worsening preeclampsia. Diagnosing and managing preeclampsia early is important. If not treated early, it can cause serious problems for mother and baby. There is no cure for this condition. However, during pregnancy, delivering the baby may be the best treatment for preeclampsia or eclampsia. For most women, symptoms of preeclampsia and eclampsia go away after giving birth. In rare cases, a woman may develop preeclampsia or eclampsia after giving birth. This usually occurs within 48 hours after childbirth but may occur up to 6 weeks after giving birth. What are the causes? The cause of this condition is not known. What increases the risk? The following factors make you more likely to develop preeclampsia:  Being pregnant for the first  time or being pregnant with multiples.  Having had preeclampsia or a condition called hemolysis, elevated liver enzymes, and low platelet count (HELLP)syndrome during a past pregnancy.  Having a family history of preeclampsia.  Being older than age 63.  Being obese.  Becoming pregnant through fertility treatments. Conditions that reduce blood flow or oxygen to your placenta and baby may also increase your risk. These include:  High blood pressure before, during, or immediately following pregnancy.  Kidney disease.  Diabetes.  Blood clotting disorders.  Autoimmune diseases, such as lupus.  Sleep apnea. What are the signs or symptoms? Common symptoms of this condition include:  A severe, throbbing headache that does not go away.  Vision problems, such as blurred or double vision and light sensitivity.  Pain in the stomach, especially the right upper region.  Pain in the shoulder. Other symptoms that may develop as the condition gets worse include:  Sudden weight gain because of fluid buildup in the body. This causes swelling of the face, hands, legs, and feet.  Severe nausea and vomiting.  Urinating less than usual.  Shortness of breath.  Seizures. How is this diagnosed? Your health care provider will ask you about symptoms and check for signs of preeclampsia during your prenatal visits. You will also have routine tests, including:  Checking your blood pressure.  Urine tests to check for protein.  Blood tests to assess your organ function.  Monitoring your baby's heart rate.  Ultrasounds to check fetal growth.   How is this treated? You and your health care provider will determine the treatment that is best for you. Treatment may include:  Frequent prenatal visits to check for preeclampsia.  Medicine to lower your blood pressure.  Medicine to prevent seizures.  Low-dose aspirin during your pregnancy.  Staying in the hospital, in severe cases. You will  be given medicines to control your blood pressure and the amount of fluids in your body.  Delivering your baby. Work with your health care provider to manage any chronic health conditions, such as diabetes or kidney problems. Also, work with your health care provider to manage weight gain during pregnancy. Follow these instructions at home: Eating and drinking  Drink enough fluid to keep your urine pale yellow.  Avoid caffeine. Caffeine may increase blood pressure and heart rate and lead to dehydration.  Reduce the amount of salt that you eat. Lifestyle  Do not use any products that contain nicotine or tobacco. These products include cigarettes, chewing tobacco, and vaping devices, such as e-cigarettes. If you need help quitting, ask your health care  provider.  Do not use alcohol or drugs.  Avoid stress as much as possible.  Rest and get plenty of sleep. General instructions  Take over-the-counter and prescription medicines only as told by your health care provider.  When lying down, lie on your left side. This keeps pressure off your major blood vessels.  When sitting or lying down, raise (elevate) your feet. Try putting pillows underneath your lower legs.  Exercise regularly. Ask your health care provider what kinds of exercise are best for you.  Check your blood pressure as often as recommended by your health care provider.  Keep all prenatal and follow-up visits. This is important.   Contact a health care provider if:  You have symptoms that may need treatment or closer monitoring. These include: ? Headaches. ? Stomach pain or nausea and vomiting. ? Shoulder pain. ? Vision problems, such as spots in front of your eyes or blurry vision. ? Sudden weight gain or increased swelling in your face, hands, legs, and feet. ? Increased anxiety or feeling of impending doom. ? Signs or symptoms of labor. Get help right away if:  You have any of the following symptoms: ? A  seizure. ? Shortness of breath or trouble breathing. ? Trouble speaking or slurred speech. ? Fainting. ? Chest pain. These symptoms may represent a serious problem that is an emergency. Do not wait to see if the symptoms will go away. Get medical help right away. Call your local emergency services (911 in the [place] Do not drive yourself to the hospital. Summary  Preeclampsia is a serious condition that may develop during pregnancy.  Diagnosing and treating preeclampsia early is very important.  Keep all prenatal and follow-up visits. This is important.  Get help right away if you have a seizure, shortness of breath or trouble breathing, trouble speaking or slurred speech, chest pain, or fainting. This information is not intended to replace advice given to you by your health care provider. Make sure you discuss any questions you have with your health care provider. Document Revised: 06/03/2020 Document Reviewed: 06/03/2020 Elsevier Patient Education  2021 Elsevier Inc.        Preterm Labor The normal length of a pregnancy is 39-41 weeks. Preterm labor is when labor starts before 37 completed weeks of pregnancy. Babies who are born prematurely and survive may not be fully developed and may be at an increased risk for long-term problems such as cerebral palsy, developmental delays, and vision and hearing problems. Babies who are born too early may have problems soon after birth. Problems may include regulating blood sugar, body temperature, heart rate, and breathing rate. These babies often have trouble with feeding. The risk of having problems is highest for babies who are born before 34 weeks of pregnancy. What are the causes? The exact cause of this condition is not known. What increases the risk? You are more likely to have preterm labor if you have certain risk factors that relate to your medical history, problems with present and past pregnancies, and lifestyle factors. Medical  history  You have abnormalities of the uterus, including a short cervix.  You have STIs (sexually transmitted infections), or other infections of the urinary tract and the vagina.  You have chronic illnesses, such as blood clotting problems, diabetes, or high blood pressure.  You are overweight or underweight. Present and past pregnancies  You have had preterm labor before.  You are pregnant with twins or other multiples.  You have been diagnosed with a  condition in which the placenta covers your cervix (placenta previa).  You waited less than 6 months between giving birth and becoming pregnant again.  Your unborn baby has some abnormalities.  You have vaginal bleeding during pregnancy.  You became pregnant through in vitro fertilization (IVF). Lifestyle and environmental factors  You use tobacco products.  You drink alcohol.  You use street drugs.  You have stress and no social support.  You experience domestic violence.  You are exposed to certain chemicals or environmental pollutants. Other factors  You are younger than age 33 or older than age 33. What are the signs or symptoms? Symptoms of this condition include:  Cramps similar to those that can happen during a menstrual period. The cramps may happen with diarrhea.  Pain in the abdomen or lower back.  Regular contractions that may feel like tightening of the abdomen.  A feeling of increased pressure in the pelvis.  Increased watery or bloody mucus discharge from the vagina.  Water breaking (ruptured amniotic sac). How is this diagnosed? This condition is diagnosed based on:  Your medical history and a physical exam.  A pelvic exam.  An ultrasound.  Monitoring your uterus for contractions.  Other tests, including: ? A swab of the cervix to check for a chemical called fetal fibronectin. ? Urine tests. How is this treated? Treatment for this condition depends on the length of your pregnancy,  your condition, and the health of your baby. Treatment may include:  Taking medicines, such as: ? Hormone medicines. These may be given early in pregnancy to help support the pregnancy. ? Medicines to stop contractions. ? Medicines to help mature the baby's lungs. These may be prescribed if the risk of delivery is high. ? Medicines to prevent your baby from developing cerebral palsy.  Bed rest. If the labor happens before 34 weeks of pregnancy, you may need to stay in the hospital.  Delivery of the baby. Follow these instructions at home:  Do not use any products that contain nicotine or tobacco, such as cigarettes, e-cigarettes, and chewing tobacco. If you need help quitting, ask your health care provider.  Do not drink alcohol.  Take over-the-counter and prescription medicines only as told by your health care provider.  Rest as told by your health care provider.  Return to your normal activities as told by your health care provider. Ask your health care provider what activities are safe for you.  Keep all follow-up visits as told by your health care provider. This is important.   How is this prevented? To increase your chance of having a full-term pregnancy:  Do not use street drugs or medicines that have not been prescribed to you during your pregnancy.  Talk with your health care provider before taking any herbal supplements, even if you have been taking them regularly.  Make sure you gain a healthy amount of weight during your pregnancy.  Watch for infection. If you think that you might have an infection, get it checked right away. Symptoms of infection may include: ? Fever. ? Abnormal vaginal discharge or discharge that smells bad. ? Pain or burning with urination. ? Needing to urinate urgently. ? Frequently urinating or passing small amounts of urine frequently. ? Blood in your urine. ? Urine that smells bad or unusual.  Tell your health care provider if you have had  preterm labor before. Contact a health care provider if:  You think you are going into preterm labor.  You have signs or  symptoms of preterm labor.  You have symptoms of infection. Get help right away if:  You are having regular, painful contractions every 5 minutes or less.  Your water breaks. Summary  Preterm labor is labor that starts before you reach 37 weeks of pregnancy.  Delivering your baby early increases your baby's risk of developing lifelong problems.  The exact cause of preterm labor is unknown. However, having an abnormal uterus, an STI (sexually transmitted infection), or vaginal bleeding during pregnancy increases your risk for preterm labor.  Keep all follow-up visits as told by your health care provider. This is important.  Contact a health care provider if you have signs or symptoms of preterm labor. This information is not intended to replace advice given to you by your health care provider. Make sure you discuss any questions you have with your health care provider. Document Revised: 10/14/2019 Document Reviewed: 10/14/2019 Elsevier Patient Education  2021 [employer].

## 2021-02-08 ENCOUNTER — Encounter: Payer: Self-pay | Admitting: Women's Health

## 2021-02-08 LAB — AFP, SERUM, OPEN SPINA BIFIDA
AFP MoM: 1.09
AFP Value: 26.1 ng/mL
Gest. Age on Collection Date: 15 weeks
Maternal Age At EDD: 32.9 yr
OSBR Risk 1 IN: 10000
Test Results:: NEGATIVE
Weight: 265 [lb_av]

## 2021-02-09 DIAGNOSIS — O10919 Unspecified pre-existing hypertension complicating pregnancy, unspecified trimester: Secondary | ICD-10-CM | POA: Insufficient documentation

## 2021-02-09 DIAGNOSIS — D573 Sickle-cell trait: Secondary | ICD-10-CM | POA: Insufficient documentation

## 2021-02-09 DIAGNOSIS — I10 Essential (primary) hypertension: Secondary | ICD-10-CM | POA: Insufficient documentation

## 2021-02-09 DIAGNOSIS — K219 Gastro-esophageal reflux disease without esophagitis: Secondary | ICD-10-CM | POA: Insufficient documentation

## 2021-02-09 DIAGNOSIS — Z5189 Encounter for other specified aftercare: Secondary | ICD-10-CM | POA: Insufficient documentation

## 2021-02-11 ENCOUNTER — Ambulatory Visit (INDEPENDENT_AMBULATORY_CARE_PROVIDER_SITE_OTHER): Payer: Medicaid Other | Admitting: Cardiology

## 2021-02-11 ENCOUNTER — Encounter: Payer: Self-pay | Admitting: Cardiology

## 2021-02-11 ENCOUNTER — Other Ambulatory Visit: Payer: Self-pay

## 2021-02-11 VITALS — BP 132/84 | HR 122 | Ht <= 58 in | Wt 267.0 lb

## 2021-02-11 DIAGNOSIS — I10 Essential (primary) hypertension: Secondary | ICD-10-CM | POA: Diagnosis not present

## 2021-02-11 DIAGNOSIS — R0602 Shortness of breath: Secondary | ICD-10-CM | POA: Diagnosis not present

## 2021-02-11 DIAGNOSIS — R600 Localized edema: Secondary | ICD-10-CM

## 2021-02-11 DIAGNOSIS — I5030 Unspecified diastolic (congestive) heart failure: Secondary | ICD-10-CM

## 2021-02-11 NOTE — Patient Instructions (Addendum)
Medication Instructions:  Your physician recommends that you continue on your current medications as directed. Please refer to the Current Medication list given to you today.  *If you need a refill on your cardiac medications before your next appointment, please call your pharmacy*   Lab Work: None If you have labs (blood work) drawn today and your tests are completely normal, you will receive your results only by: Marland Kitchen MyChart Message (if you have MyChart) OR . A paper copy in the mail If you have any lab test that is abnormal or we need to change your treatment, we will call you to review the results.   Testing/Procedures: Your physician has requested that you have an echocardiogram. Echocardiography is a painless test that uses sound waves to create images of your heart. It provides your doctor with information about the size and shape of your heart and how well your heart's chambers and valves are working. This procedure takes approximately one hour. There are no restrictions for this procedure.   Follow-Up: At [facility], you and your health needs are our priority.  As part of our continuing mission to provide you with exceptional heart care, we have created designated Provider Care Teams.  These Care Teams include your primary Cardiologist (physician) and Advanced Practice Providers (APPs -  Physician Assistants and Nurse Practitioners) who all work together to provide you with the care you need, when you need it.  We recommend signing up for the patient portal called "MyChart".  Sign up information is provided on this After Visit Summary.  MyChart is used to connect with patients for Virtual Visits (Telemedicine).  Patients are able to view lab/test results, encounter notes, upcoming appointments, etc.  Non-urgent messages can be sent to your provider as well.   To learn more about what you can do with MyChart, go to [email or web address].    Your next appointment:   4  week(s)  The format for your next appointment:   In Person  Provider:   [facility]   Other Instructions  Please take your blood pressure daily. Send them to [place] in 2 weeks. If your blood pressure is more then 140/90 for 3 days (in any order) please call us.    Echocardiogram An echocardiogram is a test that uses sound waves (ultrasound) to produce images of the heart. Images from an echocardiogram can provide important information about:  Heart size and shape.  The size and thickness and movement of your heart's walls.  Heart muscle function and strength.  Heart valve function or if you have stenosis. Stenosis is when the heart valves are too narrow.  If blood is flowing backward through the heart valves (regurgitation).  A tumor or infectious growth around the heart valves.  Areas of heart muscle that are not working well because of poor blood flow or injury from a heart attack.  Aneurysm detection. An aneurysm is a weak or damaged part of an artery wall. The wall bulges out from the normal force of blood pumping through the body. Tell a health care provider about:  Any allergies you have.  All medicines you are taking, including vitamins, herbs, eye drops, creams, and over-the-counter medicines.  Any blood disorders you have.  Any surgeries you have had.  Any medical conditions you have.  Whether you are pregnant or may be pregnant. What are the risks? Generally, this is a safe test. However, problems may occur, including an allergic reaction to dye (contrast) that may be used  during the test. What happens before the test? No specific preparation is needed. You may eat and drink normally. What happens during the test?  You will take off your clothes from the waist up and put on a hospital gown.  Electrodes or electrocardiogram (ECG)patches may be placed on your chest. The electrodes or patches are then connected to a device that monitors your heart rate  and rhythm.  You will lie down on a table for an ultrasound exam. A gel will be applied to your chest to help sound waves pass through your skin.  A handheld device, called a transducer, will be pressed against your chest and moved over your heart. The transducer produces sound waves that travel to your heart and bounce back (or "echo" back) to the transducer. These sound waves will be captured in real-time and changed into images of your heart that can be viewed on a video monitor. The images will be recorded on a computer and reviewed by your health care provider.  You may be asked to change positions or hold your breath for a short time. This makes it easier to get different views or better views of your heart.  In some cases, you may receive contrast through an IV in one of your veins. This can improve the quality of the pictures from your heart. The procedure may vary among health care providers and hospitals.   What can I expect after the test? You may return to your normal, everyday life, including diet, activities, and medicines, unless your health care provider tells you not to do that. Follow these instructions at home:  It is up to you to get the results of your test. Ask your health care provider, or the department that is doing the test, when your results will be ready.  Keep all follow-up visits. This is important. Summary  An echocardiogram is a test that uses sound waves (ultrasound) to produce images of the heart.  Images from an echocardiogram can provide important information about the size and shape of your heart, heart muscle function, heart valve function, and other possible heart problems.  You do not need to do anything to prepare before this test. You may eat and drink normally.  After the echocardiogram is completed, you may return to your normal, everyday life, unless your health care provider tells you not to do that. This information is not intended to replace  advice given to you by your health care provider. Make sure you discuss any questions you have with your health care provider. Document Revised: 05/04/2020 Document Reviewed: 05/04/2020 Elsevier Patient Education  2021 [employer].

## 2021-02-12 DIAGNOSIS — R0602 Shortness of breath: Secondary | ICD-10-CM | POA: Insufficient documentation

## 2021-02-12 DIAGNOSIS — I5030 Unspecified diastolic (congestive) heart failure: Secondary | ICD-10-CM | POA: Insufficient documentation

## 2021-02-12 HISTORY — DX: Morbid (severe) obesity due to excess calories: E66.01

## 2021-02-12 HISTORY — DX: Unspecified diastolic (congestive) heart failure: I50.30

## 2021-02-12 NOTE — Progress Notes (Signed)
Cardio-Obstetrics Clinic  New evaluation  This is 35 year old female, G3 P1-0-1-1 currently 16 weeks and 5 days pregnant was referred to be evaluated in the cardiac obstetrics clinics due to her history of heart failure with preserved ejection fraction.  She does have medical history which includes hypertension, sickle cell trait, heart failure with preserved ejection fraction EF in 2015 was low normal 50 to 55%, morbid obesity.  The patient reports that she has had some shortness of breath which is slightly worsening on exertion.  She notes that there is concerned because her first episode of flash pulmonary edema was after delivery.  And also her mother and some other family members has had cardiomyopathy associated with pregnancy.  No chest pain, lightheadedness, palpitation or dizziness.  Of note she recently lost her boyfriend which is the father of her baby to car accident and she has had been grieving.  Prior CV Studies Reviewed: The following studies were reviewed today: Echocardiogram done in January 2015 Left ventricle: The cavity size was normal. Wall thickness  was normal. Systolic function was normal. The estimated  ejection fraction was in the range of 50% to 55%. Wall  motion was normal; there were no regional wall motion  abnormalities. The transmitral flow pattern was normal. The  deceleration time of the early transmitral flow velocity was  normal. The pulmonary vein flow pattern was normal. The  tissue Doppler parameters were normal. Left ventricular  diastolic function parameters were normal.   ------------------------------------------------------------  Aortic valve:  Structurally normal valve.  Cusp separation  was normal. Doppler: Transvalvular velocity was within the  normal range. There was no stenosis. No regurgitation.   ------------------------------------------------------------  Aorta: The aorta was normal, not dilated, and non-diseased.     ------------------------------------------------------------  Mitral valve:  Structurally normal valve.  Leaflet  separation was normal. Doppler: Transvalvular velocity was  within the normal range. There was no evidence for stenosis.  Trivial regurgitation.  Peak gradient: 67mm Hg (D).   ------------------------------------------------------------  Left atrium: The atrium was normal in size.   ------------------------------------------------------------  Right ventricle: The cavity size was normal. Wall thickness  was normal. Systolic function was normal.   ------------------------------------------------------------  Pulmonic valve:  Structurally normal valve.  Cusp  separation was normal. Doppler: Transvalvular velocity was  within the normal range. No regurgitation.   ------------------------------------------------------------  Tricuspid valve:  Normal thickness leaflets. Doppler:  Mild regurgitation.   ------------------------------------------------------------  Right atrium: The atrium was normal in size.   ------------------------------------------------------------  Pericardium: The pericardium was normal in appearance.   ------------------------------------------------------------  Systemic veins:  Inferior vena cava: The vessel was normal in size; the  respirophasic diameter changes were in the normal range (=  50%); findings are consistent with normal central venous  pressure.   ------------------------------------------------------------  Post procedure conclusions  Ascending Aorta:  - The aorta was normal, not dilated, and non-diseased.    Past Medical History:  Diagnosis Date  . Acid reflux   . Blood transfusion without reported diagnosis   . Hypertension   . Sickle cell trait [facility])     Past Surgical History:  Procedure Laterality Date  . CESAREAN SECTION N/A 10/08/2013   Procedure: CESAREAN SECTION;  Surgeon: Antionette Char,  MD;  Location: [facility] ORS;  Service: Obstetrics;  Laterality: N/A;      OB History    Gravida  3   Para  1   Term  1   Preterm      AB  1   Living  1     SAB      IAB  1   Ectopic      Multiple      Live Births  1               Current Medications: Current Meds  Medication Sig  . acetaminophen (TYLENOL) 325 MG tablet Take 650 mg by mouth every 6 (six) hours as needed.  Marland Kitchen aspirin EC 81 MG tablet Take 1 tablet (81 mg total) by mouth daily. Swallow whole.  Marland Kitchen glycopyrrolate (ROBINUL) 1 MG tablet Take 1 tablet (1 mg total) by mouth 3 (three) times daily.  Marland Kitchen loratadine (CLARITIN) 10 MG tablet Take 1 tablet (10 mg total) by mouth daily.  . Prenat-Fe Poly-Methfol-FA-DHA (VITAFOL ULTRA) 29-0.6-0.4-200 MG CAPS Take 1 capsule by mouth daily before breakfast.  . promethazine (PHENERGAN) 25 MG tablet Take 1 tablet (25 mg total) by mouth every 6 (six) hours as needed for nausea or vomiting.     Allergies:   Hydralazine and Lasix [furosemide]   Social History   Socioeconomic History  . Marital status: Single    Spouse name: Not on file  . Number of children: Not on file  . Years of education: Not on file  . Highest education level: Not on file  Occupational History  . Not on file  Tobacco Use  . Smoking status: Never Smoker  . Smokeless tobacco: Never Used  Vaping Use  . Vaping Use: Never used  Substance and Sexual Activity  . Alcohol use: No  . Drug use: No  . Sexual activity: Yes    Partners: Male    Birth control/protection: None  Other Topics Concern  . Not on file  Social History Narrative   Completed some collage   Works at [employer]   From [place]   Has one son, 1 years.   Enjoys playing with her son, sleeping.   Family is established here as well.   Social Determinants of Health   Financial Resource Strain: Low Risk   . Difficulty of Paying Living Expenses: Not hard at all  Food Insecurity: No Food Insecurity  . Worried About Brewing technologist in the Last Year: Never true  . Ran Out of Food in the Last Year: Never true  Transportation Needs: No Transportation Needs  . Lack of Transportation (Medical): No  . Lack of Transportation (Non-Medical): No  Physical Activity: Unknown  . Days of Exercise per Week: 0 days  . Minutes of Exercise per Session: Not on file  Stress: Stress Concern Present  . Feeling of Stress : To some extent  Social Connections: Unknown  . Frequency of Communication with Friends and Family: More than three times a week  . Frequency of Social Gatherings with Friends and Family: More than three times a week  . Attends Religious Services: Not on file  . Active Member of Clubs or Organizations: Not on file  . Attends Banker Meetings: Not on file  . Marital Status: Not on file      Family History  Problem Relation Age of Onset  . Diabetes Mother   . Arthritis Mother   . Hypertension Mother   . Diabetes Maternal Grandmother   . Hypertension Maternal Grandmother   . Cancer Neg Hx   . Heart disease Neg Hx       ROS:   Review of Systems  Constitution: Negative for decreased appetite, fever and weight gain.  HENT: Negative for congestion,  ear discharge, hoarse voice and sore throat.   Eyes: Negative for discharge, redness, vision loss in right eye and visual halos.  Cardiovascular: Reports dyspnea on exertion and intermittent leg swelling.  Negative for chest pain, dorthopnea and palpitations.  Respiratory: Negative for cough, hemoptysis, shortness of breath and snoring.   Endocrine: Negative for heat intolerance and polyphagia.  Hematologic/Lymphatic: Negative for bleeding problem. Does not bruise/bleed easily.  Skin: Negative for flushing, nail changes, rash and suspicious lesions.  Musculoskeletal: Negative for arthritis, joint pain, muscle cramps, myalgias, neck pain and stiffness.  Gastrointestinal: Negative for abdominal pain, bowel incontinence, diarrhea and excessive appetite.   Genitourinary: Negative for decreased libido, genital sores and incomplete emptying.  Neurological: Negative for brief paralysis, focal weakness, headaches and loss of balance.  Psychiatric/Behavioral: Negative for altered mental status, depression and suicidal ideas.  Allergic/Immunologic: Negative for HIV exposure and persistent infections.     Labs/EKG Reviewed:    EKG:    The ekg ordered today demonstrates sinus tachycardia, heart rate 122 bpm with poor R wave progression  Recent Labs: 12/29/2020: Hemoglobin 12.3; Platelets 327   Recent Lipid Panel Lab Results  Component Value Date/Time   CHOL 155 10/23/2018 10:04 AM   TRIG 87.0 10/23/2018 10:04 AM   HDL 34.90 (L) 10/23/2018 10:04 AM   CHOLHDL 4 10/23/2018 10:04 AM   LDLCALC 103 (H) 10/23/2018 10:04 AM    Physical Exam:    VS:  BP 132/84 (BP Location: Left Arm)   Pulse (!) 122   Ht 4\' 10"  (1.473 m)   Wt 267 lb (121.1 kg)   LMP 10/17/2020   SpO2 99%   BMI 55.80 kg/m     Wt Readings from Last 3 Encounters:  02/11/21 267 lb (121.1 kg)  02/02/21 265 lb 12.8 oz (120.6 kg)  12/29/20 259 lb (117.5 kg)     GEN: Well nourished, well developed, obese in no acute distress HEENT: Normal NECK: No JVD; No carotid bruits LYMPHATICS: No lymphadenopathy CARDIAC: RRR, no murmurs, rubs, gallops RESPIRATORY:  Clear to auscultation without rales, wheezing or rhonchi  ABDOMEN: Soft, non-tender, non-distended MUSCULOSKELETAL: Trace bilateral ankle edema; No deformity  SKIN: Warm and dry NEUROLOGIC:  Alert and oriented x 3 PSYCHIATRIC:  Normal affect    Risk Assessment/Risk Calculators:     CARPREG II Risk Prediction Index Score:  3.  The patient's risk for a primary cardiac event is 15%.            ASSESSMENT & PLAN:    History of hypertension-was taken off antihypertensive medications History of heart failure with preserved ejection fraction-EF in 2015 50 to 55%. Morbid obesity  Shortness of breath Sinus  tachycardia   She currently is not on any antihypertensive medications.  But we are going to watch the patient closely and have low threshold to start her on antihypertensive medication to reduce cardiovascular complications during this pregnancy.  She has a blood pressure cuff at home she is going to take her blood pressure once daily she will immediately notify my office if her blood pressure reading gets up to 140/90 mmHg.  I agree with her aspirin 81 mg daily for hypertensive prophylaxis as she does have multiple risk factors for developing preeclampsia during this pregnancy.  She has had some shortness of breath, this could be part of her normal first pregnancy physiology coupled with her morbid obesity however this patient has had low ejection fraction back in 2015 and no significant family history of women with cardiomyopathy therefore  at this time it will be appropriate to get a repeat echocardiogram to reassess her LV function.  She is tachycardic which also I suspect is part of her pregnancy normal cardiovascular physiology which I did explain to the patient.  Today visit also [employer] some counseling which included weight gain recommendation as the patient is morbidly obese therefore during her pregnancy it would be beneficial to limit her weight gain between 11 to 20 pounds.  I encouraged her to increase her exercise.   Dispo: Follow-up in 6 to 8 weeks.  Medication Adjustments/Labs and Tests Ordered: Current medicines are reviewed at length with the patient today.  Concerns regarding medicines are outlined above.  Tests Ordered: Orders Placed This Encounter  Procedures  . EKG 12-Lead  . ECHOCARDIOGRAM COMPLETE   Medication Changes: No orders of the defined types were placed in this encounter.

## 2021-02-14 ENCOUNTER — Telehealth (HOSPITAL_COMMUNITY): Payer: Self-pay | Admitting: Licensed Clinical Social Worker

## 2021-02-14 NOTE — Telephone Encounter (Signed)
CSW referred to assist patient with obtaining a BP cuff. CSW contacted patient to inform cuff will be delivered to home. Patient grateful for support and assistance. CSW available as needed. Jackie Vasily Fedewa, LCSW, CCSW-MCS [phone]  

## 2021-02-16 ENCOUNTER — Ambulatory Visit (HOSPITAL_COMMUNITY)
Admission: EM | Admit: 2021-02-16 | Discharge: 2021-02-16 | Disposition: A | Discharge status: Home / Self Care | Payer: Medicaid Other | Attending: Emergency Medicine | Admitting: Emergency Medicine

## 2021-02-16 ENCOUNTER — Other Ambulatory Visit: Payer: Self-pay

## 2021-02-16 ENCOUNTER — Encounter (HOSPITAL_COMMUNITY): Payer: Self-pay | Admitting: Emergency Medicine

## 2021-02-16 DIAGNOSIS — M779 Enthesopathy, unspecified: Secondary | ICD-10-CM

## 2021-02-16 DIAGNOSIS — M25531 Pain in right wrist: Secondary | ICD-10-CM

## 2021-02-16 NOTE — ED Triage Notes (Signed)
Pt is present today with right lower arm numbness and tingling. Pt states that her sx started one week ago. Pt denies any injury

## 2021-02-16 NOTE — ED Provider Notes (Signed)
[facility]    CSN: [id number] Arrival date & time: 02/16/21  0802      History   Chief Complaint Chief Complaint  Patient presents with  . Arm Pain    Right     HPI Suzanne [name] is a 35 y.o. female.   Rt wrist tingling and uncomfortable sensation since yesterday. Pt states that she works in [employer] and thinks that it is carpal tunnel. Denies any injury. Pt is [redacted] weeks pregnant and is only taking tylenol. States that she did have this same thing in the past but never had it looked at.      Past Medical History:  Diagnosis Date  . Acid reflux   . Blood transfusion without reported diagnosis   . Hypertension   . Sickle cell trait [facility])     Patient Active Problem List   Diagnosis Date Noted  . SOB (shortness of breath) 02/12/2021  . Heart failure with preserved ejection fraction (HCC) 02/12/2021  . Morbid obesity (HCC) 02/12/2021  . Sickle cell trait (HCC)   . Hypertension   . Blood transfusion without reported diagnosis   . Acid reflux   . History of C-section 02/02/2021  . Encounter for supervision of normal pregnancy in first trimester 12/15/2020  . Morbid obesity with BMI of 50.0-59.9, adult (HCC) 01/14/2016  . Essential hypertension 12/17/2014  . Acute diastolic CHF (congestive heart failure) (HCC) 10/18/2013  . Ptyalism 04/11/2013  . SICKLE CELL TRAIT 11/22/2006    Past Surgical History:  Procedure Laterality Date  . CESAREAN SECTION N/A 10/08/2013   Procedure: CESAREAN SECTION;  Surgeon: Antionette Char, MD;  Location: [facility];  Service: Obstetrics;  Laterality: N/A;    OB History    Gravida  3   Para  1   Term  1   Preterm      AB  1   Living  1     SAB      IAB  1   Ectopic      Multiple      Live Births  1            Home Medications    Prior to Admission medications   Medication Sig Start Date End Date Taking? Authorizing Provider  acetaminophen (TYLENOL) 325 MG tablet Take 650 mg by mouth every 6 (six)  hours as needed.    [provider]  aspirin EC 81 MG tablet Take 1 tablet (81 mg total) by mouth daily. Swallow whole. 12/29/20   Brock Bad, MD  glycopyrrolate (ROBINUL) 1 MG tablet Take 1 tablet (1 mg total) by mouth 3 (three) times daily. 02/02/21   Nugent, Odie Sera, NP  loratadine (CLARITIN) 10 MG tablet Take 1 tablet (10 mg total) by mouth daily. 01/03/21   Rhys Martini, PA-C  Prenat-Fe Poly-Methfol-FA-DHA (VITAFOL ULTRA) 29-0.6-0.4-200 MG CAPS Take 1 capsule by mouth daily before breakfast. 12/29/20   Brock Bad, MD  promethazine (PHENERGAN) 25 MG tablet Take 1 tablet (25 mg total) by mouth every 6 (six) hours as needed for nausea or vomiting. 12/15/20   Warden Fillers, MD  hydrochlorothiazide (HYDRODIURIL) 12.5 MG tablet TAKE 1 TABLET BY MOUTH EVERY DAY FOR BLOOD PRESSURE Patient not taking: No sig reported 07/16/20 01/03/21  Doreene Nest, NP    Family History Family History  Problem Relation Age of Onset  . Diabetes Mother   . Arthritis Mother   . Hypertension Mother   . Diabetes Maternal  Grandmother   . Hypertension Maternal Grandmother   . Cancer Neg Hx   . Heart disease Neg Hx     Social History Social History   Tobacco Use  . Smoking status: Never Smoker  . Smokeless tobacco: Never Used  Vaping Use  . Vaping Use: Never used  Substance Use Topics  . Alcohol use: No  . Drug use: No     Allergies   Hydralazine and Lasix [furosemide]   Review of Systems Review of Systems  Constitutional: Negative.   Respiratory: Negative.   Cardiovascular: Negative.   Musculoskeletal:       Rt wrist discomfort   Skin: Negative.   Neurological:       Tingling to rt wrist that radiates to elbow area      Physical Exam Triage Vital Signs ED Triage Vitals  Enc Vitals Group     BP 02/16/21 0817 (!) 162/100     Pulse Rate 02/16/21 0817 (!) 109     Resp 02/16/21 0817 18     Temp 02/16/21 0817 98.2 F (36.8 C)     Temp src --      SpO2 02/16/21  0817 98 %     Weight --      Height --      Head Circumference --      Peak Flow --      Pain Score 02/16/21 0815 0     Pain Loc --      Pain Edu? --      Excl. in GC? --    No data found.  Updated Vital Signs BP (!) 162/100 (BP Location: Left Arm)   Pulse (!) 109   Temp 98.2 F (36.8 C)   Resp 18   LMP 10/17/2020   SpO2 98%   Visual Acuity      Physical Exam Constitutional:      Appearance: She is obese.  Neck:     Comments: Denies any neck pain or injury  Musculoskeletal:        General: Swelling present.     Cervical back: Normal range of motion.     Comments: Slight edema noted to anterior area of wrist. Full ROM, able to make fist.   Skin:    General: Skin is warm.     Capillary Refill: Capillary refill takes less than 2 seconds.  Neurological:     General: No focal deficit present.     Mental Status: She is alert.      UC Treatments / Results  Labs (all labs ordered are listed, but only abnormal results are displayed) Labs Reviewed - No data to display  EKG   Radiology No results found.  Procedures Procedures (including critical care time)  Medications Ordered in UC Medications - No data to display  Initial Impression / Assessment and Plan / UC Course  I have reviewed the triage vital signs and the nursing notes.  Pertinent labs & imaging results that were available during my care of the patient were reviewed by me and considered in my medical decision making (see chart for details).     May need to see a specialist if it cont Not able to take nsaids at this time due to preg cont to take tylenol as needed Wear splint at night and during work to help  Rest and heat will help  Final Clinical Impressions(s) / UC Diagnoses   Final diagnoses:  Tendinitis  Right wrist pain     Discharge  Instructions     May need to see a specialist if it cont Not able to take nsaids at this time due to preg cont to take tylenol as needed Wear splint  at night and during work to help  Rest and heat will help     ED Prescriptions    None     PDMP not reviewed this encounter.   Coralyn Mark, NP 02/16/21 [phone]

## 2021-02-16 NOTE — Discharge Instructions (Addendum)
May need to see a specialist if it cont Not able to take nsaids at this time due to preg cont to take tylenol as needed Wear splint at night and during work to help  Rest and heat will help

## 2021-02-22 ENCOUNTER — Encounter: Payer: Self-pay | Admitting: Women's Health

## 2021-02-23 ENCOUNTER — Encounter: Payer: Self-pay | Admitting: Women's Health

## 2021-02-23 DIAGNOSIS — D563 Thalassemia minor: Secondary | ICD-10-CM | POA: Insufficient documentation

## 2021-02-25 ENCOUNTER — Telehealth: Payer: Self-pay

## 2021-02-25 ENCOUNTER — Ambulatory Visit (HOSPITAL_COMMUNITY): Payer: Medicaid Other | Attending: Cardiology

## 2021-02-25 ENCOUNTER — Other Ambulatory Visit: Payer: Self-pay

## 2021-02-25 DIAGNOSIS — R0602 Shortness of breath: Secondary | ICD-10-CM | POA: Diagnosis not present

## 2021-02-25 LAB — ECHOCARDIOGRAM COMPLETE
Area-P 1/2: 4.96 cm2
S' Lateral: 3.1 cm

## 2021-02-25 NOTE — Telephone Encounter (Signed)
Called patient to set up and appointment on July 8th at [facility]. Left message for patient to return our call.

## 2021-03-01 ENCOUNTER — Telehealth: Payer: Self-pay

## 2021-03-01 NOTE — Telephone Encounter (Signed)
Left message on patients voicemail to please return our call.   

## 2021-03-01 NOTE — Telephone Encounter (Signed)
-----   Message from Kardie Tobb, DO sent at 02/25/2021  5:38 PM EDT ----- Great news, you heart function is normal EF is 60 to 65% 

## 2021-03-02 ENCOUNTER — Encounter: Payer: Self-pay | Admitting: Family Medicine

## 2021-03-02 ENCOUNTER — Telehealth: Payer: Self-pay

## 2021-03-02 ENCOUNTER — Other Ambulatory Visit: Payer: Self-pay

## 2021-03-02 ENCOUNTER — Encounter: Payer: Medicaid Other | Admitting: Women's Health

## 2021-03-02 ENCOUNTER — Ambulatory Visit (INDEPENDENT_AMBULATORY_CARE_PROVIDER_SITE_OTHER): Payer: Medicaid Other | Admitting: Family Medicine

## 2021-03-02 VITALS — BP 125/85 | HR 108 | Wt 270.0 lb

## 2021-03-02 DIAGNOSIS — O0990 Supervision of high risk pregnancy, unspecified, unspecified trimester: Secondary | ICD-10-CM

## 2021-03-02 DIAGNOSIS — O10919 Unspecified pre-existing hypertension complicating pregnancy, unspecified trimester: Secondary | ICD-10-CM

## 2021-03-02 DIAGNOSIS — K117 Disturbances of salivary secretion: Secondary | ICD-10-CM

## 2021-03-02 DIAGNOSIS — I5030 Unspecified diastolic (congestive) heart failure: Secondary | ICD-10-CM

## 2021-03-02 DIAGNOSIS — Z98891 History of uterine scar from previous surgery: Secondary | ICD-10-CM

## 2021-03-02 DIAGNOSIS — Z6841 Body Mass Index (BMI) 40.0 and over, adult: Secondary | ICD-10-CM

## 2021-03-02 MED ORDER — GLYCOPYRROLATE 1 MG PO TABS: 1.0000 mg | ORAL_TABLET | Freq: Three times a day (TID) | ORAL | 1 refills | Status: DC

## 2021-03-02 NOTE — Progress Notes (Signed)
   Subjective:  Suzanne [name] is a 35 y.o. G3P1011 at [redacted]w[redacted]d being seen today for ongoing prenatal care.  She is currently monitored for the following issues for this high-risk pregnancy and has Ptyalism; Acute diastolic CHF (congestive heart failure) (HCC); Essential hypertension; Morbid obesity with BMI of 50.0-59.9, adult (HCC); Supervision of high risk pregnancy, antepartum; History of C-section; Sickle cell trait (HCC); Chronic hypertension affecting pregnancy; Blood transfusion without reported diagnosis; Acid reflux; SOB (shortness of breath); Heart failure with preserved ejection fraction (HCC); Morbid obesity (HCC); and Alpha thalassemia silent carrier on their problem list.  Patient reports carpal tunnel symptoms.  Contractions: Not present. Vag. Bleeding: None.  Movement: Present. Denies leaking of fluid.   The following portions of the patient's history were reviewed and updated as appropriate: allergies, current medications, past family history, past medical history, past social history, past surgical history and problem list. Problem list updated.  Objective:   Vitals:   03/02/21 0836  BP: 125/85  Pulse: (!) 108  Weight: 270 lb (122.5 kg)    Fetal Status: Fetal Heart Rate (bpm): 150   Movement: Present     General:  Alert, oriented and cooperative. Patient is in no acute distress.  Skin: Skin is warm and dry. No rash noted.   Cardiovascular: Normal heart rate noted  Respiratory: Normal respiratory effort, no problems with respiration noted  Abdomen: Soft, gravid, appropriate for gestational age. Pain/Pressure: Absent     Pelvic: Vag. Bleeding: None     Cervical exam deferred        Extremities: Normal range of motion.     Mental Status: Normal mood and affect. Normal behavior. Normal judgment and thought content.   Urinalysis:      Assessment and Plan:  Pregnancy: G3P1011 at [redacted]w[redacted]d  1. Morbid obesity with BMI of 50.0-59.9, adult (HCC)   2. History of  C-section Desires TOLAC  3. Heart failure with preserved ejection fraction, unspecified HF chronicity (HCC) Seen in Cardio[facility]OB clinic on 02/11/2021  4. Chronic hypertension affecting pregnancy Doing well off meds Low threshold to start BP meds per Dr. Servando Salina  5. Supervision of high risk pregnancy, antepartum BP and FHR normal Has anatomy scan scheduled in a few days Trial wrist splint for carpal tunnel  Preterm labor symptoms and general obstetric precautions including but not limited to vaginal bleeding, contractions, leaking of fluid and fetal movement were reviewed in detail with the patient. Please refer to After Visit Summary for other counseling recommendations.  Return in 4 weeks (on 03/30/2021) for [facility], ob visit, needs MD.   Venora Maples, MD

## 2021-03-02 NOTE — Telephone Encounter (Signed)
-----   Message from Suzanne Ripple, DO sent at 02/25/2021  5:38 PM EDT ----- Randie Heinz news, you heart function is normal EF is 60 to 65%

## 2021-03-02 NOTE — Patient Instructions (Signed)
 Contraception Choices Contraception, also called birth control, refers to methods or devices that prevent pregnancy. Hormonal methods Contraceptive implant A contraceptive implant is a thin, plastic tube that contains a hormone that prevents pregnancy. It is different from an intrauterine device (IUD). It is inserted into the upper part of the arm by a health care provider. Implants can be effective for up to 3 years. Progestin-only injections Progestin-only injections are injections of progestin, a synthetic form of the hormone progesterone. They are given every 3 months by a health care provider. Birth control pills Birth control pills are pills that contain hormones that prevent pregnancy. They must be taken once a day, preferably at the same time each day. A prescription is needed to use this method of contraception. Birth control patch The birth control patch contains hormones that prevent pregnancy. It is placed on the skin and must be changed once a week for three weeks and removed on the fourth week. A prescription is needed to use this method of contraception. Vaginal ring A vaginal ring contains hormones that prevent pregnancy. It is placed in the vagina for three weeks and removed on the fourth week. After that, the process is repeated with a new ring. A prescription is needed to use this method of contraception. Emergency contraceptive Emergency contraceptives prevent pregnancy after unprotected sex. They come in pill form and can be taken up to 5 days after sex. They work best the sooner they are taken after having sex. Most emergency contraceptives are available without a prescription. This method should not be used as your only form of birth control.   Barrier methods Female condom A female condom is a thin sheath that is worn over the penis during sex. Condoms keep sperm from going inside a woman's body. They can be used with a sperm-killing substance (spermicide) to increase their  effectiveness. They should be thrown away after one use. Female condom A female condom is a soft, loose-fitting sheath that is put into the vagina before sex. The condom keeps sperm from going inside a woman's body. They should be thrown away after one use. Diaphragm A diaphragm is a soft, dome-shaped barrier. It is inserted into the vagina before sex, along with a spermicide. The diaphragm blocks sperm from entering the uterus, and the spermicide kills sperm. A diaphragm should be left in the vagina for 6-8 hours after sex and removed within 24 hours. A diaphragm is prescribed and fitted by a health care provider. A diaphragm should be replaced every 1-2 years, after giving birth, after gaining more than 15 lb (6.8 kg), and after pelvic surgery. Cervical cap A cervical cap is a round, soft latex or plastic cup that fits over the cervix. It is inserted into the vagina before sex, along with spermicide. It blocks sperm from entering the uterus. The cap should be left in place for 6-8 hours after sex and removed within 48 hours. A cervical cap must be prescribed and fitted by a health care provider. It should be replaced every 2 years. Sponge A sponge is a soft, circular piece of polyurethane foam with spermicide in it. The sponge helps block sperm from entering the uterus, and the spermicide kills sperm. To use it, you make it wet and then insert it into the vagina. It should be inserted before sex, left in for at least 6 hours after sex, and removed and thrown away within 30 hours. Spermicides Spermicides are chemicals that kill or block sperm from entering the   cervix and uterus. They can come as a cream, jelly, suppository, foam, or tablet. A spermicide should be inserted into the vagina with an applicator at least 10-15 minutes before sex to allow time for it to work. The process must be repeated every time you have sex. Spermicides do not require a prescription.   Intrauterine  contraception Intrauterine device (IUD) An IUD is a T-shaped device that is put in a woman's uterus. There are two types:  Hormone IUD.This type contains progestin, a synthetic form of the hormone progesterone. This type can stay in place for 3-5 years.  Copper IUD.This type is wrapped in copper wire. It can stay in place for 10 years. Permanent methods of contraception Female tubal ligation In this method, a woman's fallopian tubes are sealed, tied, or blocked during surgery to prevent eggs from traveling to the uterus. Hysteroscopic sterilization In this method, a small, flexible insert is placed into each fallopian tube. The inserts cause scar tissue to form in the fallopian tubes and block them, so sperm cannot reach an egg. The procedure takes about 3 months to be effective. Another form of birth control must be used during those 3 months. Female sterilization This is a procedure to tie off the tubes that carry sperm (vasectomy). After the procedure, the man can still ejaculate fluid (semen). Another form of birth control must be used for 3 months after the procedure. Natural planning methods Natural family planning In this method, a couple does not have sex on days when the woman could become pregnant. Calendar method In this method, the woman keeps track of the length of each menstrual cycle, identifies the days when pregnancy can happen, and does not have sex on those days. Ovulation method In this method, a couple avoids sex during ovulation. Symptothermal method This method involves not having sex during ovulation. The woman typically checks for ovulation by watching changes in her temperature and in the consistency of cervical mucus. Post-ovulation method In this method, a couple waits to have sex until after ovulation. Where to find more information  Centers for Disease Control and Prevention: www.cdc.gov Summary  Contraception, also called birth control, refers to methods or  devices that prevent pregnancy.  Hormonal methods of contraception include implants, injections, pills, patches, vaginal rings, and emergency contraceptives.  Barrier methods of contraception can include female condoms, female condoms, diaphragms, cervical caps, sponges, and spermicides.  There are two types of IUDs (intrauterine devices). An IUD can be put in a woman's uterus to prevent pregnancy for 3-5 years.  Permanent sterilization can be done through a procedure for males and females. Natural family planning methods involve nothaving sex on days when the woman could become pregnant. This information is not intended to replace advice given to you by your health care provider. Make sure you discuss any questions you have with your health care provider. Document Revised: 02/16/2020 Document Reviewed: 02/16/2020 Elsevier Patient Education  2021 Else[employer] Inc.   Breastfeeding  Choosing to breastfeed is one of the best decisions you can make for yourself and your baby. A change in hormones during pregnancy causes your breasts to make breast milk in your milk-producing glands. Hormones prevent breast milk from being released before your baby is born. They also prompt milk flow after birth. Once breastfeeding has begun, thoughts of your baby, as well as his or her sucking or crying, can stimulate the release of milk from your milk-producing glands. Benefits of breastfeeding Research shows that breastfeeding offers many health benefits   for infants and mothers. It also offers a cost-free and convenient way to feed your baby. For your baby  Your first milk (colostrum) helps your baby's digestive system to function better.  Special cells in your milk (antibodies) help your baby to fight off infections.  Breastfed babies are less likely to develop asthma, allergies, obesity, or type 2 diabetes. They are also at lower risk for sudden infant death syndrome (SIDS).  Nutrients in breast milk are better  able to meet your baby's needs compared to infant formula.  Breast milk improves your baby's brain development. For you  Breastfeeding helps to create a very special bond between you and your baby.  Breastfeeding is convenient. Breast milk costs nothing and is always available at the correct temperature.  Breastfeeding helps to burn calories. It helps you to lose the weight that you gained during pregnancy.  Breastfeeding makes your uterus return faster to its size before pregnancy. It also slows bleeding (lochia) after you give birth.  Breastfeeding helps to lower your risk of developing type 2 diabetes, osteoporosis, rheumatoid arthritis, cardiovascular disease, and breast, ovarian, uterine, and endometrial cancer later in life. Breastfeeding basics Starting breastfeeding  Find a comfortable place to sit or lie down, with your neck and back well-supported.  Place a pillow or a rolled-up blanket under your baby to bring him or her to the level of your breast (if you are seated). Nursing pillows are specially designed to help support your arms and your baby while you breastfeed.  Make sure that your baby's tummy (abdomen) is facing your abdomen.  Gently massage your breast. With your fingertips, massage from the outer edges of your breast inward toward the nipple. This encourages milk flow. If your milk flows slowly, you may need to continue this action during the feeding.  Support your breast with 4 fingers underneath and your thumb above your nipple (make the letter "C" with your hand). Make sure your fingers are well away from your nipple and your baby's mouth.  Stroke your baby's lips gently with your finger or nipple.  When your baby's mouth is open wide enough, quickly bring your baby to your breast, placing your entire nipple and as much of the areola as possible into your baby's mouth. The areola is the colored area around your nipple. ? More areola should be visible above your  baby's upper lip than below the lower lip. ? Your baby's lips should be opened and extended outward (flanged) to ensure an adequate, comfortable latch. ? Your baby's tongue should be between his or her lower gum and your breast.  Make sure that your baby's mouth is correctly positioned around your nipple (latched). Your baby's lips should create a seal on your breast and be turned out (everted).  It is common for your baby to suck about 2-3 minutes in order to start the flow of breast milk. Latching Teaching your baby how to latch onto your breast properly is very important. An improper latch can cause nipple pain, decreased milk supply, and poor weight gain in your baby. Also, if your baby is not latched onto your nipple properly, he or she may swallow some air during feeding. This can make your baby fussy. Burping your baby when you switch breasts during the feeding can help to get rid of the air. However, teaching your baby to latch on properly is still the best way to prevent fussiness from swallowing air while breastfeeding. Signs that your baby has successfully latched onto   your nipple  Silent tugging or silent sucking, without causing you pain. Infant's lips should be extended outward (flanged).  Swallowing heard between every 3-4 sucks once your milk has started to flow (after your let-down milk reflex occurs).  Muscle movement above and in front of his or her ears while sucking. Signs that your baby has not successfully latched onto your nipple  Sucking sounds or smacking sounds from your baby while breastfeeding.  Nipple pain. If you think your baby has not latched on correctly, slip your finger into the corner of your baby's mouth to break the suction and place it between your baby's gums. Attempt to start breastfeeding again. Signs of successful breastfeeding Signs from your baby  Your baby will gradually decrease the number of sucks or will completely stop sucking.  Your baby  will fall asleep.  Your baby's body will relax.  Your baby will retain a small amount of milk in his or her mouth.  Your baby will let go of your breast by himself or herself. Signs from you  Breasts that have increased in firmness, weight, and size 1-3 hours after feeding.  Breasts that are softer immediately after breastfeeding.  Increased milk volume, as well as a change in milk consistency and color by the fifth day of breastfeeding.  Nipples that are not sore, cracked, or bleeding. Signs that your baby is getting enough milk  Wetting at least 1-2 diapers during the first 24 hours after birth.  Wetting at least 5-6 diapers every 24 hours for the first week after birth. The urine should be clear or pale yellow by the age of 5 days.  Wetting 6-8 diapers every 24 hours as your baby continues to grow and develop.  At least 3 stools in a 24-hour period by the age of 5 days. The stool should be soft and yellow.  At least 3 stools in a 24-hour period by the age of 7 days. The stool should be seedy and yellow.  No loss of weight greater than 10% of birth weight during the first 3 days of life.  Average weight gain of 4-7 oz (113-198 g) per week after the age of 4 days.  Consistent daily weight gain by the age of 5 days, without weight loss after the age of 2 weeks. After a feeding, your baby may spit up a small amount of milk. This is normal. Breastfeeding frequency and duration Frequent feeding will help you make more milk and can prevent sore nipples and extremely full breasts (breast engorgement). Breastfeed when you feel the need to reduce the fullness of your breasts or when your baby shows signs of hunger. This is called "breastfeeding on demand." Signs that your baby is hungry include:  Increased alertness, activity, or restlessness.  Movement of the head from side to side.  Opening of the mouth when the corner of the mouth or cheek is stroked (rooting).  Increased  sucking sounds, smacking lips, cooing, sighing, or squeaking.  Hand-to-mouth movements and sucking on fingers or hands.  Fussing or crying. Avoid introducing a pacifier to your baby in the first 4-6 weeks after your baby is born. After this time, you may choose to use a pacifier. Research has shown that pacifier use during the first year of a baby's life decreases the risk of sudden infant death syndrome (SIDS). Allow your baby to feed on each breast as long as he or she wants. When your baby unlatches or falls asleep while feeding from the   first breast, offer the second breast. Because newborns are often sleepy in the first few weeks of life, you may need to awaken your baby to get him or her to feed. Breastfeeding times will vary from baby to baby. However, the following rules can serve as a guide to help you make sure that your baby is properly fed:  Newborns (babies 4 weeks of age or younger) may breastfeed every 1-3 hours.  Newborns should not go without breastfeeding for longer than 3 hours during the day or 5 hours during the night.  You should breastfeed your baby a minimum of 8 times in a 24-hour period. Breast milk pumping Pumping and storing breast milk allows you to make sure that your baby is exclusively fed your breast milk, even at times when you are unable to breastfeed. This is especially important if you go back to work while you are still breastfeeding, or if you are not able to be present during feedings. Your lactation consultant can help you find a method of pumping that works best for you and give you guidelines about how long it is safe to store breast milk.      Caring for your breasts while you breastfeed Nipples can become dry, cracked, and sore while breastfeeding. The following recommendations can help keep your breasts moisturized and healthy:  Avoid using soap on your nipples.  Wear a supportive bra designed especially for nursing. Avoid wearing underwire-style  bras or extremely tight bras (sports bras).  Air-dry your nipples for 3-4 minutes after each feeding.  Use only cotton bra pads to absorb leaked breast milk. Leaking of breast milk between feedings is normal.  Use lanolin on your nipples after breastfeeding. Lanolin helps to maintain your skin's normal moisture barrier. Pure lanolin is not harmful (not toxic) to your baby. You may also hand express a few drops of breast milk and gently massage that milk into your nipples and allow the milk to air-dry. In the first few weeks after giving birth, some women experience breast engorgement. Engorgement can make your breasts feel heavy, warm, and tender to the touch. Engorgement peaks within 3-5 days after you give birth. The following recommendations can help to ease engorgement:  Completely empty your breasts while breastfeeding or pumping. You may want to start by applying warm, moist heat (in the shower or with warm, water-soaked hand towels) just before feeding or pumping. This increases circulation and helps the milk flow. If your baby does not completely empty your breasts while breastfeeding, pump any extra milk after he or she is finished.  Apply ice packs to your breasts immediately after breastfeeding or pumping, unless this is too uncomfortable for you. To do this: ? Put ice in a plastic bag. ? Place a towel between your skin and the bag. ? Leave the ice on for 20 minutes, 2-3 times a day.  Make sure that your baby is latched on and positioned properly while breastfeeding. If engorgement persists after 48 hours of following these recommendations, contact your health care provider or a lactation consultant. Overall health care recommendations while breastfeeding  Eat 3 healthy meals and 3 snacks every day. Well-nourished mothers who are breastfeeding need an additional 450-500 calories a day. You can meet this requirement by increasing the amount of a balanced diet that you eat.  Drink  enough water to keep your urine pale yellow or clear.  Rest often, relax, and continue to take your prenatal vitamins to prevent fatigue, stress, and low   vitamin and mineral levels in your body (nutrient deficiencies).  Do not use any products that contain nicotine or tobacco, such as cigarettes and e-cigarettes. Your baby may be harmed by chemicals from cigarettes that pass into breast milk and exposure to secondhand smoke. If you need help quitting, ask your health care provider.  Avoid alcohol.  Do not use illegal drugs or marijuana.  Talk with your health care provider before taking any medicines. These include over-the-counter and prescription medicines as well as vitamins and herbal supplements. Some medicines that may be harmful to your baby can pass through breast milk.  It is possible to become pregnant while breastfeeding. If birth control is desired, ask your health care provider about options that will be safe while breastfeeding your baby. Where to find more information: [employer]: www.llli.org Contact a health care provider if:  You feel like you want to stop breastfeeding or have become frustrated with breastfeeding.  Your nipples are cracked or bleeding.  Your breasts are red, tender, or warm.  You have: ? Painful breasts or nipples. ? A swollen area on either breast. ? A fever or chills. ? Nausea or vomiting. ? Drainage other than breast milk from your nipples.  Your breasts do not become full before feedings by the fifth day after you give birth.  You feel sad and depressed.  Your baby is: ? Too sleepy to eat well. ? Having trouble sleeping. ? More than 1 week old and wetting fewer than 6 diapers in a 24-hour period. ? Not gaining weight by 5 days of age.  Your baby has fewer than 3 stools in a 24-hour period.  Your baby's skin or the white parts of his or her eyes become yellow. Get help right away if:  Your baby is overly tired  (lethargic) and does not want to wake up and feed.  Your baby develops an unexplained fever. Summary  Breastfeeding offers many health benefits for infant and mothers.  Try to breastfeed your infant when he or she shows early signs of hunger.  Gently tickle or stroke your baby's lips with your finger or nipple to allow the baby to open his or her mouth. Bring the baby to your breast. Make sure that much of the areola is in your baby's mouth. Offer one side and burp the baby before you offer the other side.  Talk with your health care provider or lactation consultant if you have questions or you face problems as you breastfeed. This information is not intended to replace advice given to you by your health care provider. Make sure you discuss any questions you have with your health care provider. Document Revised: 12/06/2017 Document Reviewed: 10/13/2016 Elsevier Patient Education  2021 Elsevier Inc.  

## 2021-03-02 NOTE — Telephone Encounter (Signed)
Left message on patients voicemail to please return our call.   Letter will be mailed to the patient at this time.

## 2021-03-02 NOTE — Progress Notes (Signed)
Pt states that she is having Carpal tunnel symptoms, advised common in pregnancy- to discuss.

## 2021-03-07 ENCOUNTER — Ambulatory Visit: Payer: Medicaid Other

## 2021-03-07 ENCOUNTER — Other Ambulatory Visit: Payer: Medicaid Other

## 2021-03-08 ENCOUNTER — Ambulatory Visit: Payer: Medicaid Other

## 2021-03-11 ENCOUNTER — Other Ambulatory Visit: Payer: Self-pay

## 2021-03-11 ENCOUNTER — Other Ambulatory Visit: Payer: Self-pay | Admitting: *Deleted

## 2021-03-11 ENCOUNTER — Ambulatory Visit: Payer: Medicaid Other | Attending: Obstetrics and Gynecology | Admitting: *Deleted

## 2021-03-11 ENCOUNTER — Ambulatory Visit (HOSPITAL_BASED_OUTPATIENT_CLINIC_OR_DEPARTMENT_OTHER): Payer: Medicaid Other

## 2021-03-11 ENCOUNTER — Encounter: Payer: Self-pay | Admitting: *Deleted

## 2021-03-11 VITALS — BP 114/72 | HR 114

## 2021-03-11 DIAGNOSIS — Z98891 History of uterine scar from previous surgery: Secondary | ICD-10-CM

## 2021-03-11 DIAGNOSIS — E669 Obesity, unspecified: Secondary | ICD-10-CM | POA: Diagnosis not present

## 2021-03-11 DIAGNOSIS — Z3401 Encounter for supervision of normal first pregnancy, first trimester: Secondary | ICD-10-CM | POA: Diagnosis not present

## 2021-03-11 DIAGNOSIS — O99412 Diseases of the circulatory system complicating pregnancy, second trimester: Secondary | ICD-10-CM

## 2021-03-11 DIAGNOSIS — Z363 Encounter for antenatal screening for malformations: Secondary | ICD-10-CM | POA: Insufficient documentation

## 2021-03-11 DIAGNOSIS — Z3A20 20 weeks gestation of pregnancy: Secondary | ICD-10-CM | POA: Diagnosis not present

## 2021-03-11 DIAGNOSIS — O99212 Obesity complicating pregnancy, second trimester: Secondary | ICD-10-CM | POA: Insufficient documentation

## 2021-03-11 DIAGNOSIS — D563 Thalassemia minor: Secondary | ICD-10-CM

## 2021-03-11 DIAGNOSIS — O0990 Supervision of high risk pregnancy, unspecified, unspecified trimester: Secondary | ICD-10-CM

## 2021-03-11 DIAGNOSIS — R638 Other symptoms and signs concerning food and fluid intake: Secondary | ICD-10-CM

## 2021-03-23 ENCOUNTER — Other Ambulatory Visit: Payer: Self-pay | Admitting: Family Medicine

## 2021-03-23 DIAGNOSIS — K117 Disturbances of salivary secretion: Secondary | ICD-10-CM

## 2021-03-25 ENCOUNTER — Ambulatory Visit (INDEPENDENT_AMBULATORY_CARE_PROVIDER_SITE_OTHER): Payer: Medicaid Other | Admitting: Obstetrics and Gynecology

## 2021-03-25 ENCOUNTER — Encounter: Payer: Self-pay | Admitting: Obstetrics and Gynecology

## 2021-03-25 ENCOUNTER — Other Ambulatory Visit: Payer: Self-pay

## 2021-03-25 VITALS — BP 134/82 | HR 106 | Wt 271.0 lb

## 2021-03-25 DIAGNOSIS — K117 Disturbances of salivary secretion: Secondary | ICD-10-CM

## 2021-03-25 DIAGNOSIS — Z3480 Encounter for supervision of other normal pregnancy, unspecified trimester: Secondary | ICD-10-CM

## 2021-03-25 DIAGNOSIS — Z6841 Body Mass Index (BMI) 40.0 and over, adult: Secondary | ICD-10-CM

## 2021-03-25 DIAGNOSIS — O0990 Supervision of high risk pregnancy, unspecified, unspecified trimester: Secondary | ICD-10-CM

## 2021-03-25 DIAGNOSIS — O10919 Unspecified pre-existing hypertension complicating pregnancy, unspecified trimester: Secondary | ICD-10-CM

## 2021-03-25 DIAGNOSIS — I5030 Unspecified diastolic (congestive) heart failure: Secondary | ICD-10-CM

## 2021-03-25 DIAGNOSIS — Z98891 History of uterine scar from previous surgery: Secondary | ICD-10-CM

## 2021-03-25 MED ORDER — GLYCOPYRROLATE 1 MG PO TABS: 1.0000 mg | ORAL_TABLET | Freq: Three times a day (TID) | ORAL | 1 refills | Status: DC

## 2021-03-25 NOTE — Progress Notes (Signed)
   PRENATAL VISIT NOTE  Subjective:  Suzanne [name] is a 35 y.o. G3P1011 at [redacted]w[redacted]d being seen today for ongoing prenatal care.  She is currently monitored for the following issues for this high-risk pregnancy and has Ptyalism; Acute diastolic CHF (congestive heart failure) (HCC); Essential hypertension; Morbid obesity with BMI of 50.0-59.9, adult (HCC); Supervision of high risk pregnancy, antepartum; History of C-section; Sickle cell trait (HCC); Chronic hypertension affecting pregnancy; Blood transfusion without reported diagnosis; Acid reflux; SOB (shortness of breath); Heart failure with preserved ejection fraction (HCC); Morbid obesity (HCC); and Alpha thalassemia silent carrier on their problem list.  Patient reports persistent wrist pain.  Contractions: Not present. Vag. Bleeding: None.  Movement: Present. Denies leaking of fluid.   The following portions of the patient's history were reviewed and updated as appropriate: allergies, current medications, past family history, past medical history, past social history, past surgical history and problem list.   Objective:   Vitals:   03/25/21 0817  BP: 134/82  Pulse: (!) 106  Weight: 271 lb (122.9 kg)    Fetal Status: Fetal Heart Rate (bpm): 146 Fundal Height: 35 cm Movement: Present     General:  Alert, oriented and cooperative. Patient is in no acute distress.  Skin: Skin is warm and dry. No rash noted.   Cardiovascular: Normal heart rate noted  Respiratory: Normal respiratory effort, no problems with respiration noted  Abdomen: Soft, gravid, appropriate for gestational age.  Pain/Pressure: Absent     Pelvic: Cervical exam deferred        Extremities: Normal range of motion.  Edema: Trace  Mental Status: Normal mood and affect. Normal behavior. Normal judgment and thought content.   Assessment and Plan:  Pregnancy: G3P1011 at [redacted]w[redacted]d 1. Supervision of high risk pregnancy, antepartum Patient is doing well without complaints Third  trimester labs with glucola next visit Discussed the benefits of the wrist brace. Also offered referral to orthopedics and patient declined at this time Refill on [facility] provided  2. Chronic hypertension affecting pregnancy Normotensive without medication Continue ASA  3. Heart failure with preserved ejection fraction, unspecified HF chronicity (HCC) Followed by [facility] cardiology- next appointment on 7/8  4. Morbid obesity with BMI of 50.0-59.9, adult (HCC) Continue ASA  5. History of C-section Patient desires TOLAC  Preterm labor symptoms and general obstetric precautions including but not limited to vaginal bleeding, contractions, leaking of fluid and fetal movement were reviewed in detail with the patient. Please refer to After Visit Summary for other counseling recommendations.   Return in about 4 weeks (around 04/22/2021) for in person, ROB, High risk, 2 hr glucola next visit.  Future Appointments  Date Time Provider Department Center  04/01/2021  3:00 PM Thomasene Ripple, DO [facility] None  04/08/2021  3:30 PM [facility] NURSE [facility]  04/08/2021  3:45 PM [facility] US4 [facility]    Catalina Antigua, MD

## 2021-03-25 NOTE — Progress Notes (Signed)
ROB 22.5wks Continued problem with ptyalism, reports it is affecting her sleep and work. States current medication is not helping. Reports use of another med with previous pregnancy that helped more. Reports continued numbness and tingling in right hand and wrist due to "carpal tunnel", some problems with left as well.

## 2021-04-01 ENCOUNTER — Ambulatory Visit: Payer: Medicaid Other

## 2021-04-01 ENCOUNTER — Ambulatory Visit: Payer: Medicaid Other | Admitting: Cardiology

## 2021-04-07 ENCOUNTER — Ambulatory Visit: Payer: Medicaid Other

## 2021-04-08 ENCOUNTER — Ambulatory Visit: Payer: Medicaid Other

## 2021-04-13 ENCOUNTER — Other Ambulatory Visit: Payer: Self-pay | Admitting: Obstetrics and Gynecology

## 2021-04-13 ENCOUNTER — Telehealth: Payer: Self-pay | Admitting: Cardiology

## 2021-04-13 DIAGNOSIS — K117 Disturbances of salivary secretion: Secondary | ICD-10-CM

## 2021-04-13 NOTE — Telephone Encounter (Signed)
Message sent to patient

## 2021-04-13 NOTE — Telephone Encounter (Signed)
Called patient, the phone connected but no-one said anything, there was background noise. After about 20 seconds the phone disconnected. Will sent MyChart message to patient in regards to making an appointment change.

## 2021-04-13 NOTE — Telephone Encounter (Signed)
Patient called to reschedule her [facility] appointment with Dr. Servando Salina. She states she initially cancelled because she started a new job and was unable to get off work. She states she is still unable to get off of work on Fridays as she is still in training at work and only days off are Wednesdays.  She would like to know if there's anything else she can do. Please advise when able.

## 2021-04-20 ENCOUNTER — Other Ambulatory Visit: Payer: Self-pay

## 2021-04-20 ENCOUNTER — Telehealth: Payer: Self-pay

## 2021-04-20 DIAGNOSIS — K117 Disturbances of salivary secretion: Secondary | ICD-10-CM

## 2021-04-20 MED ORDER — GLYCOPYRROLATE 1 MG PO TABS: 1.0000 mg | ORAL_TABLET | Freq: Three times a day (TID) | ORAL | 1 refills | Status: DC

## 2021-04-20 NOTE — Telephone Encounter (Signed)
Pt left message needs refill on Rx "Robinul'.  to stop/help with spitting  Ok per Dr.Constant to refill w/ 1 refill.

## 2021-04-22 ENCOUNTER — Other Ambulatory Visit: Payer: Medicaid Other

## 2021-04-22 ENCOUNTER — Encounter: Payer: Medicaid Other | Admitting: Obstetrics & Gynecology

## 2021-04-29 ENCOUNTER — Other Ambulatory Visit: Payer: Self-pay | Admitting: *Deleted

## 2021-04-29 DIAGNOSIS — O0990 Supervision of high risk pregnancy, unspecified, unspecified trimester: Secondary | ICD-10-CM

## 2021-04-29 DIAGNOSIS — I5031 Acute diastolic (congestive) heart failure: Secondary | ICD-10-CM

## 2021-04-29 DIAGNOSIS — I5030 Unspecified diastolic (congestive) heart failure: Secondary | ICD-10-CM

## 2021-05-04 ENCOUNTER — Other Ambulatory Visit: Payer: Self-pay

## 2021-05-04 ENCOUNTER — Other Ambulatory Visit: Payer: Medicaid Other

## 2021-05-04 ENCOUNTER — Ambulatory Visit (INDEPENDENT_AMBULATORY_CARE_PROVIDER_SITE_OTHER): Payer: Medicaid Other | Admitting: Obstetrics and Gynecology

## 2021-05-04 VITALS — BP 121/84 | HR 111 | Wt 269.0 lb

## 2021-05-04 DIAGNOSIS — Z6841 Body Mass Index (BMI) 40.0 and over, adult: Secondary | ICD-10-CM

## 2021-05-04 DIAGNOSIS — I5030 Unspecified diastolic (congestive) heart failure: Secondary | ICD-10-CM

## 2021-05-04 DIAGNOSIS — O10919 Unspecified pre-existing hypertension complicating pregnancy, unspecified trimester: Secondary | ICD-10-CM

## 2021-05-04 DIAGNOSIS — Z98891 History of uterine scar from previous surgery: Secondary | ICD-10-CM

## 2021-05-04 DIAGNOSIS — D573 Sickle-cell trait: Secondary | ICD-10-CM

## 2021-05-04 DIAGNOSIS — D563 Thalassemia minor: Secondary | ICD-10-CM

## 2021-05-04 DIAGNOSIS — K219 Gastro-esophageal reflux disease without esophagitis: Secondary | ICD-10-CM

## 2021-05-04 DIAGNOSIS — O0990 Supervision of high risk pregnancy, unspecified, unspecified trimester: Secondary | ICD-10-CM

## 2021-05-04 DIAGNOSIS — Z3A28 28 weeks gestation of pregnancy: Secondary | ICD-10-CM | POA: Insufficient documentation

## 2021-05-04 DIAGNOSIS — K117 Disturbances of salivary secretion: Secondary | ICD-10-CM

## 2021-05-04 MED ORDER — PANTOPRAZOLE SODIUM 40 MG PO TBEC: 40.0000 mg | DELAYED_RELEASE_TABLET | Freq: Every day | ORAL | 2 refills | Status: DC

## 2021-05-04 NOTE — Progress Notes (Signed)
Pt states she still has issues with spitting and heartburn- is taking tums and Robinul

## 2021-05-04 NOTE — Progress Notes (Signed)
   PRENATAL VISIT NOTE  Subjective:  Suzanne [name] is a 35 y.o. G3P1011 at [redacted]w[redacted]d being seen today for ongoing prenatal care.  She is currently monitored for the following issues for this high-risk pregnancy and has Ptyalism; Acute diastolic CHF (congestive heart failure) (HCC); Essential hypertension; Morbid obesity with BMI of 50.0-59.9, adult (HCC); Supervision of high risk pregnancy, antepartum; History of C-section; Sickle cell trait (HCC); Chronic hypertension affecting pregnancy; Blood transfusion without reported diagnosis; Acid reflux; SOB (shortness of breath); Heart failure with preserved ejection fraction (HCC); Morbid obesity (HCC); Alpha thalassemia silent carrier; and [redacted] weeks gestation of pregnancy on their problem list.  Patient doing well with no acute concerns today. She reports no complaints.  Contractions: Not present. Vag. Bleeding: None.  Movement: Present. Denies leaking of fluid.   The following portions of the patient's history were reviewed and updated as appropriate: allergies, current medications, past family history, past medical history, past social history, past surgical history and problem list. Problem list updated.  Objective:   Vitals:   05/04/21 0835  BP: 121/84  Pulse: (!) 111  Weight: 269 lb (122 kg)    Fetal Status: Fetal Heart Rate (bpm): 140   Movement: Present     General:  Alert, oriented and cooperative. Patient is in no acute distress.  Skin: Skin is warm and dry. No rash noted.   Cardiovascular: Normal heart rate noted  Respiratory: Normal respiratory effort, no problems with respiration noted  Abdomen: Soft, gravid, appropriate for gestational age.  Pain/Pressure: Absent     Pelvic: Cervical exam deferred        Extremities: Normal range of motion.     Mental Status:  Normal mood and affect. Normal behavior. Normal judgment and thought content.   Assessment and Plan:  Pregnancy: G3P1011 at [redacted]w[redacted]d  1. Heart failure with preserved ejection  fraction, unspecified HF chronicity (HCC) Pt is aware of cardiac OB clinic, but she is not able to coordinate visits with Dr. Bobbye Charleston, pt advised to call for alternative scheduling, but will reach out to Dr. Bobbye Charleston as well.  2. Chronic hypertension affecting pregnancy BP in normal parameters without meds  3. Ptyalism Continues to use robinul  4. Gastroesophageal reflux disease without esophagitis Pt has used tums with little effect, protonix prescribed - pantoprazole (PROTONIX) 40 MG tablet; Take 1 tablet (40 mg total) by mouth daily.  Dispense: 30 tablet; Refill: 2  5. Supervision of high risk pregnancy, antepartum Continue routine care - Glucose Tolerance, 2 Hours w/1 Hour - CBC - HIV Antibody (routine testing w rflx) - RPR  6. Sickle cell trait (HCC)   7. Morbid obesity with BMI of 50.0-59.9, adult (HCC)   8. History of C-section Again discussed VBAC versus TOLAC with patient.  Reviewed risks and benefits.  Pt will discuss with family and hopefully sign consent at next visit  9. Alpha thalassemia silent carrier   10. [redacted] weeks gestation of pregnancy   Preterm labor symptoms and general obstetric precautions including but not limited to vaginal bleeding, contractions, leaking of fluid and fetal movement were reviewed in detail with the patient.  Please refer to After Visit Summary for other counseling recommendations.   Return in about 2 weeks (around 05/18/2021) for [facility], in person.   Mariel Aloe, MD Faculty Attending Center for [facility]

## 2021-05-05 LAB — CBC
Hematocrit: 32.4 % — ABNORMAL LOW (ref 34.0–46.6)
Hemoglobin: 10.3 g/dL — ABNORMAL LOW (ref 11.1–15.9)
MCH: 23.4 pg — ABNORMAL LOW (ref 26.6–33.0)
MCHC: 31.8 g/dL (ref 31.5–35.7)
MCV: 74 fL — ABNORMAL LOW (ref 79–97)
Platelets: 292 10*3/uL (ref 150–450)
RBC: 4.41 x10E6/uL (ref 3.77–5.28)
RDW: 15.1 % (ref 11.7–15.4)
WBC: 10.1 10*3/uL (ref 3.4–10.8)

## 2021-05-05 LAB — GLUCOSE TOLERANCE, 2 HOURS W/ 1HR
Glucose, 1 hour: 190 mg/dL — ABNORMAL HIGH (ref 65–179)
Glucose, 2 hour: 135 mg/dL (ref 65–152)
Glucose, Fasting: 99 mg/dL — ABNORMAL HIGH (ref 65–91)

## 2021-05-05 LAB — HIV ANTIBODY (ROUTINE TESTING W REFLEX): HIV Screen 4th Generation wRfx: NONREACTIVE

## 2021-05-05 LAB — RPR: RPR Ser Ql: NONREACTIVE

## 2021-05-09 ENCOUNTER — Other Ambulatory Visit: Payer: Self-pay | Admitting: *Deleted

## 2021-05-09 ENCOUNTER — Telehealth: Payer: Self-pay | Admitting: Obstetrics and Gynecology

## 2021-05-09 DIAGNOSIS — O24410 Gestational diabetes mellitus in pregnancy, diet controlled: Secondary | ICD-10-CM

## 2021-05-09 DIAGNOSIS — O0990 Supervision of high risk pregnancy, unspecified, unspecified trimester: Secondary | ICD-10-CM

## 2021-05-09 MED ORDER — ACCU-CHEK GUIDE VI STRP
ORAL_STRIP | 5 refills | Status: DC
Start: 2021-05-09 — End: 2021-07-13

## 2021-05-09 MED ORDER — ACCU-CHEK SOFTCLIX LANCETS MISC: 5 refills | Status: DC

## 2021-05-09 MED ORDER — GLYCOPYRROLATE 1 MG PO TABS: 1.0000 mg | ORAL_TABLET | Freq: Three times a day (TID) | ORAL | 2 refills | Status: DC

## 2021-05-09 MED ORDER — ACCU-CHEK GUIDE W/DEVICE KIT
PACK | 0 refills | Status: DC
Start: 2021-05-09 — End: 2021-07-13

## 2021-05-09 NOTE — Telephone Encounter (Signed)
Patient called requesting refills on her Robinol.  She is currently taking as prescribed TID but only given #30.   Verbal order approved by Dr. Donavan Foil.   Refill routed to pharmacy.

## 2021-05-10 ENCOUNTER — Telehealth: Payer: Self-pay

## 2021-05-10 NOTE — Telephone Encounter (Signed)
Return call to pt regarding results from 2 hr GTT I asked pt was she fasting Pt states had apple juice the night of the test. States it was a small amount. Pt would like redraw .   Please advise. If ok I will order labs and get pt scheduled for repeat 2 hr GTT

## 2021-05-11 ENCOUNTER — Telehealth: Payer: Self-pay

## 2021-05-11 ENCOUNTER — Ambulatory Visit: Payer: BC Managed Care – PPO | Attending: Obstetrics | Admitting: *Deleted

## 2021-05-11 ENCOUNTER — Encounter: Payer: Self-pay | Admitting: *Deleted

## 2021-05-11 ENCOUNTER — Other Ambulatory Visit: Payer: Self-pay | Admitting: *Deleted

## 2021-05-11 ENCOUNTER — Other Ambulatory Visit: Payer: Self-pay

## 2021-05-11 ENCOUNTER — Ambulatory Visit (HOSPITAL_BASED_OUTPATIENT_CLINIC_OR_DEPARTMENT_OTHER): Payer: BC Managed Care – PPO

## 2021-05-11 VITALS — BP 117/61 | HR 111

## 2021-05-11 DIAGNOSIS — Z363 Encounter for antenatal screening for malformations: Secondary | ICD-10-CM

## 2021-05-11 DIAGNOSIS — I509 Heart failure, unspecified: Secondary | ICD-10-CM | POA: Insufficient documentation

## 2021-05-11 DIAGNOSIS — O24410 Gestational diabetes mellitus in pregnancy, diet controlled: Secondary | ICD-10-CM

## 2021-05-11 DIAGNOSIS — O99213 Obesity complicating pregnancy, third trimester: Secondary | ICD-10-CM | POA: Diagnosis not present

## 2021-05-11 DIAGNOSIS — O34219 Maternal care for unspecified type scar from previous cesarean delivery: Secondary | ICD-10-CM | POA: Diagnosis not present

## 2021-05-11 DIAGNOSIS — Z98891 History of uterine scar from previous surgery: Secondary | ICD-10-CM

## 2021-05-11 DIAGNOSIS — Z3A29 29 weeks gestation of pregnancy: Secondary | ICD-10-CM | POA: Diagnosis not present

## 2021-05-11 DIAGNOSIS — O0990 Supervision of high risk pregnancy, unspecified, unspecified trimester: Secondary | ICD-10-CM

## 2021-05-11 DIAGNOSIS — O10913 Unspecified pre-existing hypertension complicating pregnancy, third trimester: Secondary | ICD-10-CM

## 2021-05-11 DIAGNOSIS — O99413 Diseases of the circulatory system complicating pregnancy, third trimester: Secondary | ICD-10-CM | POA: Diagnosis not present

## 2021-05-11 DIAGNOSIS — R638 Other symptoms and signs concerning food and fluid intake: Secondary | ICD-10-CM

## 2021-05-11 DIAGNOSIS — D573 Sickle-cell trait: Secondary | ICD-10-CM | POA: Insufficient documentation

## 2021-05-11 DIAGNOSIS — Z862 Personal history of diseases of the blood and blood-forming organs and certain disorders involving the immune mechanism: Secondary | ICD-10-CM

## 2021-05-11 DIAGNOSIS — D563 Thalassemia minor: Secondary | ICD-10-CM

## 2021-05-11 NOTE — Telephone Encounter (Signed)
Return call to pt regarding drinking small amount of apple juice night before 2hr GTT Consulted w/ Dr.Bass pt needs to keep GDM appt as advised pt made aware and voiced understanding.

## 2021-05-13 ENCOUNTER — Encounter: Payer: Self-pay | Admitting: Family Medicine

## 2021-05-13 DIAGNOSIS — Z7189 Other specified counseling: Secondary | ICD-10-CM | POA: Insufficient documentation

## 2021-05-18 ENCOUNTER — Other Ambulatory Visit: Payer: Self-pay

## 2021-05-18 ENCOUNTER — Ambulatory Visit (INDEPENDENT_AMBULATORY_CARE_PROVIDER_SITE_OTHER): Payer: Medicaid Other | Admitting: Obstetrics & Gynecology

## 2021-05-18 VITALS — BP 124/76 | HR 128 | Wt 278.0 lb

## 2021-05-18 DIAGNOSIS — Z6841 Body Mass Index (BMI) 40.0 and over, adult: Secondary | ICD-10-CM

## 2021-05-18 DIAGNOSIS — O24410 Gestational diabetes mellitus in pregnancy, diet controlled: Secondary | ICD-10-CM

## 2021-05-18 DIAGNOSIS — O0990 Supervision of high risk pregnancy, unspecified, unspecified trimester: Secondary | ICD-10-CM

## 2021-05-18 DIAGNOSIS — Z98891 History of uterine scar from previous surgery: Secondary | ICD-10-CM

## 2021-05-18 MED ORDER — METFORMIN HCL 500 MG PO TABS: 500.0000 mg | ORAL_TABLET | Freq: Every day | ORAL | 5 refills | Status: DC

## 2021-05-18 NOTE — Progress Notes (Signed)
   PRENATAL VISIT NOTE  Subjective:  Suzanne [name] is a 35 y.o. G3P1011 at [redacted]w[redacted]d being seen today for ongoing prenatal care.  She is currently monitored for the following issues for this high-risk pregnancy and has Ptyalism; Acute diastolic CHF (congestive heart failure) (HCC); Essential hypertension; Morbid obesity with BMI of 50.0-59.9, adult (HCC); Supervision of high risk pregnancy, antepartum; History of C-section; Sickle cell trait (HCC); Chronic hypertension affecting pregnancy; Blood transfusion without reported diagnosis; Acid reflux; SOB (shortness of breath); Heart failure with preserved ejection fraction (HCC); Morbid obesity (HCC); Alpha thalassemia silent carrier; [redacted] weeks gestation of pregnancy; Red Chart Rounds Patient; and Diet controlled gestational diabetes mellitus (GDM) in third trimester on their problem list.  Patient reports no complaints.  Contractions: Not present. Vag. Bleeding: None.  Movement: Present. Denies leaking of fluid.   The following portions of the patient's history were reviewed and updated as appropriate: allergies, current medications, past family history, past medical history, past social history, past surgical history and problem list.   Objective:   Vitals:   05/18/21 1329  BP: 124/76  Pulse: (!) 128  Weight: 278 lb (126.1 kg)    Fetal Status: Fetal Heart Rate (bpm): 140   Movement: Present     General:  Alert, oriented and cooperative. Patient is in no acute distress.  Skin: Skin is warm and dry. No rash noted.   Cardiovascular: Normal heart rate noted  Respiratory: Normal respiratory effort, no problems with respiration noted  Abdomen: Soft, gravid, appropriate for gestational age.  Pain/Pressure: Absent     Pelvic: Cervical exam deferred        Extremities: Normal range of motion.  Edema: Trace  Mental Status: Normal mood and affect. Normal behavior. Normal judgment and thought content.   Assessment and Plan:  Pregnancy: G3P1011 at  [redacted]w[redacted]d 1. History of C-section Desires TOLAC  2. Supervision of high risk pregnancy, antepartum [place] reviewed  3. Morbid obesity with BMI of 50.0-59.9, adult (HCC) Body mass index is 58.1 kg/m.   4. Diet controlled gestational diabetes mellitus (GDM) in third trimester FBS 128, will add AC supper metformin. QID BG testing - metFORMIN (GLUCOPHAGE) 500 MG tablet; Take 1 tablet (500 mg total) by mouth daily with supper.  Dispense: 60 tablet; Refill: 5  Preterm labor symptoms and general obstetric precautions including but not limited to vaginal bleeding, contractions, leaking of fluid and fetal movement were reviewed in detail with the patient. Please refer to After Visit Summary for other counseling recommendations.   Return in about 2 weeks (around 06/01/2021).  Future Appointments  Date Time Provider Department Center  05/25/2021 10:15 AM [facility]  06/01/2021  1:20 PM Thomasene Ripple, DO [facility] None  06/08/2021  2:15 PM [facility] NURSE [facility]  06/08/2021  2:30 PM [facility] US3 [facility]    Scheryl Darter, MD

## 2021-05-18 NOTE — Progress Notes (Signed)
+   Fetal movement. No complaints. Pt has glucose readings for review.

## 2021-05-25 ENCOUNTER — Ambulatory Visit: Payer: Medicaid Other | Admitting: Registered"

## 2021-05-25 ENCOUNTER — Other Ambulatory Visit: Payer: Self-pay

## 2021-05-25 ENCOUNTER — Encounter: Payer: BC Managed Care – PPO | Attending: Obstetrics and Gynecology | Admitting: Registered"

## 2021-05-25 DIAGNOSIS — O24410 Gestational diabetes mellitus in pregnancy, diet controlled: Secondary | ICD-10-CM | POA: Diagnosis not present

## 2021-05-25 DIAGNOSIS — Z3A00 Weeks of gestation of pregnancy not specified: Secondary | ICD-10-CM | POA: Insufficient documentation

## 2021-05-25 DIAGNOSIS — O0990 Supervision of high risk pregnancy, unspecified, unspecified trimester: Secondary | ICD-10-CM | POA: Diagnosis not present

## 2021-05-25 NOTE — Progress Notes (Signed)
Patient was seen for Gestational Diabetes self-management on 05/25/21  Start time 1010 and End time 1110   Estimated due date: 07/24/21; [redacted]w[redacted]d  Clinical: Medications: metformin 500 qhs x1 week Medical History: reviewed Labs: OGTT elevated FBS, 1 hr, A1c 5.5% 2 yrs ago  Dietary and Lifestyle History: Patient is here with grandmother who has Type 2 Diabetes and has been trying to help her. Pt states she has numbness in right hand and was told it is due to carpal tunnel. 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 Pt states she is not eating as many swiss rolls as she used to.  Physical Activity: ADLs Stress: not assessed Sleep: not assessed  24 hr Recall: First Meal: trix cereal 2 c, 1% milk Or bacon & eggs when not working Snack: candy, chips Second meal: hot pocket OR salad (iceberg lettuce, cheese [place], ham, ranch) OR just tater tots cheerwine Snack:same as above Third meal: spaghetti, spinach Snack: Beverages: water, soda with meals, sweet tea, regular or diet Sprite  NUTRITION INTERVENTION  Nutrition education (E-1) on the following topics:   Initial Follow-up  [x]  []  Definition of Gestational Diabetes [x]  []  Why dietary management is important in controlling blood glucose [x]  []  Effects each nutrient has on blood glucose levels []  []  Simple carbohydrates vs complex carbohydrates []  []  Fluid intake [x]  []  Creating a balanced meal plan [x]  []  Carbohydrate counting  [x]  []  When to check blood glucose levels [x]  []  Proper blood glucose monitoring techniques [x]  []  Effect of stress and stress reduction techniques  [x]  []  Exercise effect on blood glucose levels, appropriate exercise during pregnancy [x]  []  Importance of limiting caffeine and abstaining from alcohol and smoking [x]  []  Medications used for blood sugar control during pregnancy [x]  []  Hypoglycemia and rule of 15 [x]  []  Postpartum self care  Patient already has a meter,  reported  FBS: 90-95 mg/dL Postprandial: ?  Patient instructed to monitor glucose levels: FBS: 60 - ? 95 mg/dL (some clinics use 90 for cutoff) 1 hour: ? 140 mg/dL 2 hour: ? mg/dL  Patient received handouts: Nutrition Diabetes and Pregnancy Carbohydrate Counting List  Patient will be seen for follow-up as needed.

## 2021-06-01 ENCOUNTER — Other Ambulatory Visit: Payer: Self-pay

## 2021-06-01 ENCOUNTER — Ambulatory Visit (INDEPENDENT_AMBULATORY_CARE_PROVIDER_SITE_OTHER): Payer: Medicaid Other | Admitting: Obstetrics & Gynecology

## 2021-06-01 ENCOUNTER — Encounter: Payer: Self-pay | Admitting: Cardiology

## 2021-06-01 ENCOUNTER — Ambulatory Visit (INDEPENDENT_AMBULATORY_CARE_PROVIDER_SITE_OTHER): Payer: BC Managed Care – PPO | Admitting: Cardiology

## 2021-06-01 VITALS — BP 130/84 | HR 116 | Wt 281.0 lb

## 2021-06-01 VITALS — BP 134/88 | HR 106 | Ht <= 58 in | Wt 279.0 lb

## 2021-06-01 DIAGNOSIS — Z3A32 32 weeks gestation of pregnancy: Secondary | ICD-10-CM

## 2021-06-01 DIAGNOSIS — O10919 Unspecified pre-existing hypertension complicating pregnancy, unspecified trimester: Secondary | ICD-10-CM

## 2021-06-01 DIAGNOSIS — O24410 Gestational diabetes mellitus in pregnancy, diet controlled: Secondary | ICD-10-CM

## 2021-06-01 DIAGNOSIS — I5030 Unspecified diastolic (congestive) heart failure: Secondary | ICD-10-CM

## 2021-06-01 DIAGNOSIS — Z6841 Body Mass Index (BMI) 40.0 and over, adult: Secondary | ICD-10-CM | POA: Diagnosis not present

## 2021-06-01 DIAGNOSIS — Z79899 Other long term (current) drug therapy: Secondary | ICD-10-CM | POA: Diagnosis not present

## 2021-06-01 DIAGNOSIS — O0990 Supervision of high risk pregnancy, unspecified, unspecified trimester: Secondary | ICD-10-CM

## 2021-06-01 DIAGNOSIS — Z8679 Personal history of other diseases of the circulatory system: Secondary | ICD-10-CM

## 2021-06-01 NOTE — Patient Instructions (Signed)
Medication Instructions:  Your physician recommends that you continue on your current medications as directed. Please refer to the Current Medication list given to you today.  *If you need a refill on your cardiac medications before your next appointment, please call your pharmacy*   Lab Work: Your physician recommends that you return for lab work in:  TODAY: BMET, Mag If you have labs (blood work) drawn today and your tests are completely normal, you will receive your results only by: MyChart Message (if you have MyChart) OR A paper copy in the mail If you have any lab test that is abnormal or we need to change your treatment, we will call you to review the results.   Testing/Procedures: None   Follow-Up: At [facility], you and your health needs are our priority.  As part of our continuing mission to provide you with exceptional heart care, we have created designated Provider Care Teams.  These Care Teams include your primary Cardiologist (physician) and Advanced Practice Providers (APPs -  Physician Assistants and Nurse Practitioners) who all work together to provide you with the care you need, when you need it.  We recommend signing up for the patient portal called "MyChart".  Sign up information is provided on this After Visit Summary.  MyChart is used to connect with patients for Virtual Visits (Telemedicine).  Patients are able to view lab/test results, encounter notes, upcoming appointments, etc.  Non-urgent messages can be sent to your provider as well.   To learn more about what you can do with MyChart, go to [email or web address].    Your next appointment:   4 week(s)  The format for your next appointment:   In Person  Provider:   Thomasene Ripple, DO [address], [place], [place] [address]    Other Instructions  Please take your blood pressure daily and send a MyChart message on Monday.

## 2021-06-01 NOTE — Progress Notes (Signed)
   PRENATAL VISIT NOTE  Subjective:  Suzanne [name] is a 35 y.o. G3P1011 at [redacted]w[redacted]d being seen today for ongoing prenatal care.  She is currently monitored for the following issues for this high-risk pregnancy and has Ptyalism; Acute diastolic CHF (congestive heart failure) (HCC); Essential hypertension; Morbid obesity with BMI of 50.0-59.9, adult (HCC); Supervision of high risk pregnancy, antepartum; History of C-section; Sickle cell trait (HCC); Chronic hypertension affecting pregnancy; Blood transfusion without reported diagnosis; Acid reflux; SOB (shortness of breath); Heart failure with preserved ejection fraction (HCC); Morbid obesity (HCC); Alpha thalassemia silent carrier; Red Chart Rounds Patient; and Diet controlled gestational diabetes mellitus (GDM) in third trimester on their problem list.  Patient reports no complaints.  Contractions: Not present. Vag. Bleeding: None.  Movement: Present. Denies leaking of fluid.   The following portions of the patient's history were reviewed and updated as appropriate: allergies, current medications, past family history, past medical history, past social history, past surgical history and problem list.   Objective:   Vitals:   06/01/21 1550  BP: 130/84  Pulse: (!) 116  Weight: 281 lb (127.5 kg)    Fetal Status: Fetal Heart Rate (bpm): 140   Movement: Present     General:  Alert, oriented and cooperative. Patient is in no acute distress.  Skin: Skin is warm and dry. No rash noted.   Cardiovascular: Normal heart rate noted  Respiratory: Normal respiratory effort, no problems with respiration noted  Abdomen: Soft, gravid, appropriate for gestational age.  Pain/Pressure: Present     Pelvic: Cervical exam deferred        Extremities: Normal range of motion.  Edema: Mild pitting, slight indentation  Mental Status: Normal mood and affect. Normal behavior. Normal judgment and thought content.   Assessment and Plan:  Pregnancy: G3P1011 at [redacted]w[redacted]d 1.  Heart failure with preserved ejection fraction, unspecified HF chronicity (HCC) 2. Chronic hypertension affecting pregnancy Followed by Cardio Obstetrics, appreciate their help and recommendations.  3. Diet controlled gestational diabetes mellitus (GDM) in third trimester CBGs reviewed on meter, mostly within range. Gave log sheet and asked to write down. Continue diet control  4. [redacted] weeks gestation of pregnancy 5. Supervision of high risk pregnancy, antepartum Scheduled for MFM scans. Preterm labor symptoms and general obstetric precautions including but not limited to vaginal bleeding, contractions, leaking of fluid and fetal movement were reviewed in detail with the patient. Please refer to After Visit Summary for other counseling recommendations.   Return in about 2 weeks (around 06/15/2021) for OFFICE OB VISIT (MD only).  Future Appointments  Date Time Provider Department Center  06/08/2021  9:30 AM [facility] NURSE [facility]  06/08/2021  9:45 AM [facility] US5 [facility]  06/29/2021  9:00 AM Tobb, Lavona Mound, DO [facility]    Jaynie Collins, MD

## 2021-06-01 NOTE — Patient Instructions (Signed)
Return to office for any scheduled appointments. Call the office or go to the MAU at [facility] [place] if:  You begin to have strong, frequent contractions  Your water breaks.  Sometimes it is a big gush of fluid, sometimes it is just a trickle that keeps getting your panties wet or running down your legs  You have vaginal bleeding.  It is normal to have a small amount of spotting if your cervix was checked.   You do not feel your baby moving like normal.  If you do not, get something to eat and drink and lay down and focus on feeling your baby move.   If your baby is still not moving like normal, you should call the office or go to MAU.  Any other obstetric concerns.   

## 2021-06-01 NOTE — Progress Notes (Signed)
Cardio-Obstetrics Clinic  Follow Up Note   Date:  06/01/2021   ID:  Suzanne [name], DOB 08-12-1986, MRN [id number]  PCP:  [name], NP   [facility] Providers Cardiologist:  [name], DO  Electrophysiologist:  None        Referring MD: Janyth Pupa, DO   Chief Complaint: " I am doing well"  History of Present Illness:    Suzanne [name] is a 35 y.o. female [G3P1011] who returns for follow up of chronic hypertension and history of heart failure with preserved ejection fraction.  I initially saw the patient on Feb 11, 2021 at that time she was referred to the cardio obstetrics clinic given her history of heart failure with preserved ejection fraction and hypertension.  At that visit she had been off hypertensive medications and her blood pressure was acceptable.  No medication was started.  She did have bilateral leg edema which was suspected to be dependent edema from her work.  We ordered a repeat echocardiogram.  In the interim she was able to get her repeat echocardiogram which showed improved ejection fraction 60 to 65%.  There were no valvular abnormalities.   She is here today for follow-up.  She offers no complaints at this time.  She has changed jobs she is very happy with her new job.  She also is recovering somewhat from grief of losing the father of her baby.   Prior CV Studies Reviewed: The following studies were reviewed today:   Past Medical History:  Diagnosis Date   Acid reflux    Blood transfusion without reported diagnosis    Hypertension    Sickle cell trait [facility])     Past Surgical History:  Procedure Laterality Date   CESAREAN SECTION N/A 10/08/2013   Procedure: CESAREAN SECTION;  Surgeon: Lahoma Crocker, MD;  Location: [facility];  Service: Obstetrics;  Laterality: N/A;      OB History     Gravida  3   Para  1   Term  1   Preterm      AB  1   Living  1      SAB      IAB  1   Ectopic      Multiple      Live  Births  1               Current Medications: Current Meds  Medication Sig   Accu-Chek Softclix Lancets lancets Use 1 lancet to check blood sugar 4 times daily   acetaminophen (TYLENOL) 325 MG tablet Take 650 mg by mouth every 6 (six) hours as needed for mild pain or moderate pain.   aspirin EC 81 MG tablet Take 1 tablet (81 mg total) by mouth daily. Swallow whole.   Blood Glucose Monitoring Suppl (ACCU-CHEK GUIDE) w/Device KIT Use glucose meter to check blood sugar 4 times daily   glucose blood (ACCU-CHEK GUIDE) test strip Use 1 test strip to check blood sugar 4 times daily   glycopyrrolate (ROBINUL) 1 MG tablet Take 1 tablet (1 mg total) by mouth 3 (three) times daily.   loratadine (CLARITIN) 10 MG tablet Take 1 tablet (10 mg total) by mouth daily.   metFORMIN (GLUCOPHAGE) 500 MG tablet Take 1 tablet (500 mg total) by mouth daily with supper.   pantoprazole (PROTONIX) 40 MG tablet Take 1 tablet (40 mg total) by mouth daily.   Prenat-Fe Poly-Methfol-FA-DHA (VITAFOL ULTRA) 29-0.6-0.4-200 MG CAPS Take 1 capsule by mouth daily  before breakfast.     Allergies:   Hydralazine and Lasix [furosemide]   Social History   Socioeconomic History   Marital status: Single    Spouse name: Not on file   Number of children: Not on file   Years of education: Not on file   Highest education level: Not on file  Occupational History   Not on file  Tobacco Use   Smoking status: Never   Smokeless tobacco: Never  Vaping Use   Vaping Use: Never used  Substance and Sexual Activity   Alcohol use: No   Drug use: No   Sexual activity: Yes    Partners: Male    Birth control/protection: None  Other Topics Concern   Not on file  Social History Narrative   Completed some collage   Works at [employer]   Has one son, 1 years.   Enjoys playing with her son, sleeping.   Family is established here as well.   Social Determinants of Health   Financial Resource Strain: Low Risk     Difficulty of Paying Living Expenses: Not hard at all  Food Insecurity: No Food Insecurity   Worried About Charity fundraiser in the Last Year: Never true   [place] in the Last Year: Never true  Transportation Needs: No Transportation Needs   Lack of Transportation (Medical): No   Lack of Transportation (Non-Medical): No  Physical Activity: Unknown   Days of Exercise per Week: 0 days   Minutes of Exercise per Session: Not on file  Stress: Stress Concern Present   Feeling of Stress : To some extent  Social Connections: Unknown   Frequency of Communication with Friends and Family: More than three times a week   Frequency of Social Gatherings with Friends and Family: More than three times a week   Attends Religious Services: Not on Electrical engineer or Organizations: Not on file   Attends Archivist Meetings: Not on file   Marital Status: Not on file      Family History  Problem Relation Age of Onset   Diabetes Mother    Arthritis Mother    Hypertension Mother    Diabetes Maternal Grandmother    Hypertension Maternal Grandmother    Cancer Neg Hx    Heart disease Neg Hx       ROS:   Please see the history of present illness.     All other systems reviewed and are negative.   Labs/EKG Reviewed:    EKG:   EKG is was not ordered today.    TTE 02/2021  IMPRESSIONS     1. Left ventricular ejection fraction, by estimation, is 60 to 65%. The  left ventricle has normal function. The left ventricle has no regional  wall motion abnormalities. Left ventricular diastolic parameters were  normal. The average left ventricular  global longitudinal strain is -23.7 %. The global longitudinal strain is  normal.   2. Right ventricular systolic function is normal. The right ventricular  size is normal. Tricuspid regurgitation signal is inadequate for assessing  PA pressure.   3. The mitral valve is normal in structure. No evidence of mitral valve   regurgitation. No evidence of mitral stenosis.   4. The aortic valve is normal in structure. Aortic valve regurgitation is  not visualized. No aortic stenosis is present.   5. The inferior vena cava is normal in size with greater than 50%  respiratory variability, suggesting right atrial pressure of 3 mmHg.   FINDINGS   Left Ventricle: Left ventricular ejection fraction, by estimation, is 60  to 65%. The left ventricle has normal function. The left ventricle has no  regional wall motion abnormalities. The average left ventricular global  longitudinal strain is -23.7 %.  The global longitudinal strain is normal. The left ventricular internal  cavity size was normal in size. There is no left ventricular hypertrophy.  Left ventricular diastolic parameters were normal. Normal left ventricular  filling pressure.   Right Ventricle: The right ventricular size is normal. No increase in  right ventricular wall thickness. Right ventricular systolic function is  normal. Tricuspid regurgitation signal is inadequate for assessing PA  pressure.   Left Atrium: Left atrial size was normal in size.   Right Atrium: Right atrial size was normal in size.   Pericardium: There is no evidence of pericardial effusion.   Mitral Valve: The mitral valve is normal in structure. No evidence of  mitral valve regurgitation. No evidence of mitral valve stenosis.   Tricuspid Valve: The tricuspid valve is normal in structure. Tricuspid  valve regurgitation is not demonstrated. No evidence of tricuspid  stenosis.   Aortic Valve: The aortic valve is normal in structure. Aortic valve  regurgitation is not visualized. No aortic stenosis is present.   Pulmonic Valve: The pulmonic valve was normal in structure. Pulmonic valve  regurgitation is trivial. No evidence of pulmonic stenosis.   Aorta: The aortic root is normal in size and structure.   Venous: The inferior vena cava is normal in size with greater than  50%  respiratory variability, suggesting right atrial pressure of 3 mmHg.   IAS/Shunts: No atrial level shunt detected by color flow Doppler.  Recent Labs: 05/04/2021: Hemoglobin 10.3; Platelets 292   Recent Lipid Panel Lab Results  Component Value Date/Time   CHOL 155 10/23/2018 10:04 AM   TRIG 87.0 10/23/2018 10:04 AM   HDL 34.90 (L) 10/23/2018 10:04 AM   CHOLHDL 4 10/23/2018 10:04 AM   LDLCALC 103 (H) 10/23/2018 10:04 AM    Physical Exam:    VS:  BP 134/88 (BP Location: Left Arm, Patient Position: Sitting)   Pulse (!) 106   Ht _0  (1.473 m)   Wt 279 lb 0.6 oz (126.6 kg)   LMP 10/17/2020   SpO2 98%   BMI 58.32 kg/m     Wt Readings from Last 3 Encounters:  06/01/21 279 lb 0.6 oz (126.6 kg)  05/18/21 278 lb (126.1 kg)  05/04/21 269 lb (122 kg)     GEN:  Well nourished, well developed in no acute distress HEENT: Normal NECK: No JVD; No carotid bruits LYMPHATICS: No lymphadenopathy CARDIAC: RRR, no murmurs, rubs, gallops RESPIRATORY:  Clear to auscultation without rales, wheezing or rhonchi  ABDOMEN: Soft, non-tender, non-distended MUSCULOSKELETAL:  No edema; No deformity  SKIN: Warm and dry NEUROLOGIC:  Alert and oriented x 3 PSYCHIATRIC:  Normal affect    Risk Assessment/Risk Calculators:     CARPREG II Risk Prediction Index Score:  1.  The patient's risk for a primary cardiac event is 5%.           ASSESSMENT & PLAN:    History of chronic hypertension History of heart failure with preserved ejection fraction, EF has improved now 60 to 65% Morbid obesity  Thankfully she appears to be doing well from a cardiovascular standpoint.  Her leg edema has improved significantly now that she is not standing on  her feet all day at work.  Her blood pressure was manually performed by me which was 134/88 mmHg.  I did explain to the patient that we have to be vigilant and I have asked her to take her blood pressure daily to keep a very close eye on this.  We have  given the patient a blood pressure cuff today in the office he can use to get her blood pressure.  Is agreeable to do this.  I have asked her if she sees 2 readings of her blood pressure over 140/90 mmHg to notify my office right away.  In which case I will have low threshold to start her on antihypertensive medication.  She will continue her aspirin 81 mg daily for her preeclampsia prophylaxis.  Counseling on weight management during pregnancy I have encouraged the patient not to gain more than 20 pounds during this time.  Follow-up in 4 weeks.  Patient Instructions  Medication Instructions:  Your physician recommends that you continue on your current medications as directed. Please refer to the Current Medication list given to you today.  *If you need a refill on your cardiac medications before your next appointment, please call your pharmacy*   Lab Work: Your physician recommends that you return for lab work in:  TODAY: BMET, [place] If you have labs (blood work) drawn today and your tests are completely normal, you will receive your results only by: MyChart Message (if you have [place]) OR A paper copy in the mail If you have any lab test that is abnormal or we need to change your treatment, we will call you to review the results.   Testing/Procedures: None   Follow-Up: At [facility], you and your health needs are our priority.  As part of our continuing mission to provide you with exceptional heart care, we have created designated Provider Care Teams.  These Care Teams include your primary Cardiologist (physician) and Advanced Practice Providers (APPs -  Physician Assistants and Nurse Practitioners) who all work together to provide you with the care you need, when you need it.  We recommend signing up for the patient portal called "MyChart".  Sign up information is provided on this After Visit Summary.  MyChart is used to connect with patients for Virtual Visits (Telemedicine).   Patients are able to view lab/test results, encounter notes, upcoming appointments, etc.  Non-urgent messages can be sent to your provider as well.   To learn more about what you can do with MyChart, go to [email or web address].    Your next appointment:   4 week(s)  The format for your next appointment:   In Person  Provider:   [name], DO [address], [place], [place] [address]    Other Instructions  Please take your blood pressure daily and send a MyChart message on Monday.    Dispo:  No follow-ups on file.   Medication Adjustments/Labs and Tests Ordered: Current medicines are reviewed at length with the patient today.  Concerns regarding medicines are outlined above.  Tests Ordered: Orders Placed This Encounter  Procedures   Basic metabolic panel   Magnesium    Medication Changes: No orders of the defined types were placed in this encounter.

## 2021-06-02 LAB — BASIC METABOLIC PANEL
BUN/Creatinine Ratio: 9 (ref 9–23)
BUN: 4 mg/dL — ABNORMAL LOW (ref 6–20)
CO2: 23 mmol/L (ref 20–29)
Calcium: 9.5 mg/dL (ref 8.7–10.2)
Chloride: 101 mmol/L (ref 96–106)
Creatinine, Ser: 0.46 mg/dL — ABNORMAL LOW (ref 0.57–1.00)
Glucose: 79 mg/dL (ref 65–99)
Potassium: 4.3 mmol/L (ref 3.5–5.2)
Sodium: 135 mmol/L (ref 134–144)
eGFR: 130 mL/min/{1.73_m2} (ref >59)

## 2021-06-02 LAB — MAGNESIUM: Magnesium: 1.8 mg/dL (ref 1.6–2.3)

## 2021-06-08 ENCOUNTER — Other Ambulatory Visit: Payer: Self-pay | Admitting: *Deleted

## 2021-06-08 ENCOUNTER — Other Ambulatory Visit: Payer: Self-pay | Admitting: Maternal & Fetal Medicine

## 2021-06-08 ENCOUNTER — Ambulatory Visit: Payer: BC Managed Care – PPO | Admitting: *Deleted

## 2021-06-08 ENCOUNTER — Encounter: Payer: Self-pay | Admitting: *Deleted

## 2021-06-08 ENCOUNTER — Ambulatory Visit: Payer: Medicaid Other

## 2021-06-08 ENCOUNTER — Ambulatory Visit: Payer: BC Managed Care – PPO | Attending: Maternal & Fetal Medicine

## 2021-06-08 ENCOUNTER — Other Ambulatory Visit: Payer: Self-pay

## 2021-06-08 VITALS — BP 136/79 | HR 113

## 2021-06-08 DIAGNOSIS — O99213 Obesity complicating pregnancy, third trimester: Secondary | ICD-10-CM | POA: Diagnosis not present

## 2021-06-08 DIAGNOSIS — O10913 Unspecified pre-existing hypertension complicating pregnancy, third trimester: Secondary | ICD-10-CM | POA: Diagnosis not present

## 2021-06-08 DIAGNOSIS — D563 Thalassemia minor: Secondary | ICD-10-CM | POA: Insufficient documentation

## 2021-06-08 DIAGNOSIS — Z6841 Body Mass Index (BMI) 40.0 and over, adult: Secondary | ICD-10-CM

## 2021-06-08 DIAGNOSIS — O24415 Gestational diabetes mellitus in pregnancy, controlled by oral hypoglycemic drugs: Secondary | ICD-10-CM

## 2021-06-08 DIAGNOSIS — Z98891 History of uterine scar from previous surgery: Secondary | ICD-10-CM | POA: Diagnosis not present

## 2021-06-08 DIAGNOSIS — O10013 Pre-existing essential hypertension complicating pregnancy, third trimester: Secondary | ICD-10-CM

## 2021-06-08 DIAGNOSIS — Z3A33 33 weeks gestation of pregnancy: Secondary | ICD-10-CM

## 2021-06-08 DIAGNOSIS — O0990 Supervision of high risk pregnancy, unspecified, unspecified trimester: Secondary | ICD-10-CM | POA: Diagnosis not present

## 2021-06-08 DIAGNOSIS — I509 Heart failure, unspecified: Secondary | ICD-10-CM | POA: Diagnosis not present

## 2021-06-08 DIAGNOSIS — R638 Other symptoms and signs concerning food and fluid intake: Secondary | ICD-10-CM | POA: Insufficient documentation

## 2021-06-08 DIAGNOSIS — O99413 Diseases of the circulatory system complicating pregnancy, third trimester: Secondary | ICD-10-CM

## 2021-06-08 DIAGNOSIS — O34219 Maternal care for unspecified type scar from previous cesarean delivery: Secondary | ICD-10-CM

## 2021-06-15 ENCOUNTER — Other Ambulatory Visit: Payer: Self-pay

## 2021-06-15 ENCOUNTER — Ambulatory Visit (HOSPITAL_BASED_OUTPATIENT_CLINIC_OR_DEPARTMENT_OTHER): Payer: BC Managed Care – PPO

## 2021-06-15 ENCOUNTER — Ambulatory Visit: Payer: BC Managed Care – PPO

## 2021-06-15 ENCOUNTER — Other Ambulatory Visit: Payer: Self-pay | Admitting: Maternal & Fetal Medicine

## 2021-06-15 ENCOUNTER — Other Ambulatory Visit: Payer: Self-pay | Admitting: *Deleted

## 2021-06-15 ENCOUNTER — Ambulatory Visit: Payer: BC Managed Care – PPO | Attending: Maternal & Fetal Medicine | Admitting: *Deleted

## 2021-06-15 VITALS — BP 139/87 | HR 102

## 2021-06-15 DIAGNOSIS — O24419 Gestational diabetes mellitus in pregnancy, unspecified control: Secondary | ICD-10-CM | POA: Insufficient documentation

## 2021-06-15 DIAGNOSIS — O26893 Other specified pregnancy related conditions, third trimester: Secondary | ICD-10-CM | POA: Insufficient documentation

## 2021-06-15 DIAGNOSIS — O99213 Obesity complicating pregnancy, third trimester: Secondary | ICD-10-CM

## 2021-06-15 DIAGNOSIS — Z862 Personal history of diseases of the blood and blood-forming organs and certain disorders involving the immune mechanism: Secondary | ICD-10-CM | POA: Diagnosis not present

## 2021-06-15 DIAGNOSIS — Z8249 Family history of ischemic heart disease and other diseases of the circulatory system: Secondary | ICD-10-CM | POA: Diagnosis not present

## 2021-06-15 DIAGNOSIS — D563 Thalassemia minor: Secondary | ICD-10-CM

## 2021-06-15 DIAGNOSIS — Z98891 History of uterine scar from previous surgery: Secondary | ICD-10-CM

## 2021-06-15 DIAGNOSIS — O34219 Maternal care for unspecified type scar from previous cesarean delivery: Secondary | ICD-10-CM | POA: Insufficient documentation

## 2021-06-15 DIAGNOSIS — I519 Heart disease, unspecified: Secondary | ICD-10-CM | POA: Insufficient documentation

## 2021-06-15 DIAGNOSIS — O24415 Gestational diabetes mellitus in pregnancy, controlled by oral hypoglycemic drugs: Secondary | ICD-10-CM | POA: Diagnosis not present

## 2021-06-15 DIAGNOSIS — Z6841 Body Mass Index (BMI) 40.0 and over, adult: Secondary | ICD-10-CM

## 2021-06-15 DIAGNOSIS — O0990 Supervision of high risk pregnancy, unspecified, unspecified trimester: Secondary | ICD-10-CM

## 2021-06-15 DIAGNOSIS — Z3A34 34 weeks gestation of pregnancy: Secondary | ICD-10-CM

## 2021-06-15 DIAGNOSIS — O99413 Diseases of the circulatory system complicating pregnancy, third trimester: Secondary | ICD-10-CM

## 2021-06-15 DIAGNOSIS — E669 Obesity, unspecified: Secondary | ICD-10-CM

## 2021-06-15 DIAGNOSIS — O10913 Unspecified pre-existing hypertension complicating pregnancy, third trimester: Secondary | ICD-10-CM

## 2021-06-15 DIAGNOSIS — I509 Heart failure, unspecified: Secondary | ICD-10-CM

## 2021-06-22 ENCOUNTER — Ambulatory Visit (INDEPENDENT_AMBULATORY_CARE_PROVIDER_SITE_OTHER): Payer: Medicaid Other | Admitting: Obstetrics and Gynecology

## 2021-06-22 ENCOUNTER — Ambulatory Visit: Payer: BC Managed Care – PPO | Attending: Maternal & Fetal Medicine

## 2021-06-22 ENCOUNTER — Ambulatory Visit: Payer: BC Managed Care – PPO | Admitting: *Deleted

## 2021-06-22 ENCOUNTER — Other Ambulatory Visit: Payer: Self-pay

## 2021-06-22 ENCOUNTER — Encounter: Payer: Self-pay | Admitting: Obstetrics and Gynecology

## 2021-06-22 VITALS — BP 140/74 | HR 115

## 2021-06-22 VITALS — BP 148/88 | HR 101 | Wt 291.3 lb

## 2021-06-22 DIAGNOSIS — O24415 Gestational diabetes mellitus in pregnancy, controlled by oral hypoglycemic drugs: Secondary | ICD-10-CM | POA: Diagnosis not present

## 2021-06-22 DIAGNOSIS — Z3A35 35 weeks gestation of pregnancy: Secondary | ICD-10-CM | POA: Diagnosis not present

## 2021-06-22 DIAGNOSIS — O99213 Obesity complicating pregnancy, third trimester: Secondary | ICD-10-CM | POA: Diagnosis not present

## 2021-06-22 DIAGNOSIS — D563 Thalassemia minor: Secondary | ICD-10-CM

## 2021-06-22 DIAGNOSIS — O99413 Diseases of the circulatory system complicating pregnancy, third trimester: Secondary | ICD-10-CM | POA: Diagnosis not present

## 2021-06-22 DIAGNOSIS — O0990 Supervision of high risk pregnancy, unspecified, unspecified trimester: Secondary | ICD-10-CM

## 2021-06-22 DIAGNOSIS — O34219 Maternal care for unspecified type scar from previous cesarean delivery: Secondary | ICD-10-CM

## 2021-06-22 DIAGNOSIS — O10919 Unspecified pre-existing hypertension complicating pregnancy, unspecified trimester: Secondary | ICD-10-CM

## 2021-06-22 DIAGNOSIS — I5031 Acute diastolic (congestive) heart failure: Secondary | ICD-10-CM

## 2021-06-22 DIAGNOSIS — O24410 Gestational diabetes mellitus in pregnancy, diet controlled: Secondary | ICD-10-CM

## 2021-06-22 DIAGNOSIS — O10913 Unspecified pre-existing hypertension complicating pregnancy, third trimester: Secondary | ICD-10-CM | POA: Diagnosis not present

## 2021-06-22 DIAGNOSIS — I5030 Unspecified diastolic (congestive) heart failure: Secondary | ICD-10-CM

## 2021-06-22 DIAGNOSIS — Z98891 History of uterine scar from previous surgery: Secondary | ICD-10-CM

## 2021-06-22 DIAGNOSIS — Z6841 Body Mass Index (BMI) 40.0 and over, adult: Secondary | ICD-10-CM

## 2021-06-22 DIAGNOSIS — I509 Heart failure, unspecified: Secondary | ICD-10-CM | POA: Diagnosis not present

## 2021-06-22 DIAGNOSIS — E669 Obesity, unspecified: Secondary | ICD-10-CM

## 2021-06-22 NOTE — Patient Instructions (Signed)

## 2021-06-22 NOTE — Progress Notes (Signed)
Subjective:  Kamorah L Yusupov is a 35 y.o. G3P1011 at [redacted]w[redacted]d being seen today for ongoing prenatal care.  She is currently monitored for the following issues for this high-risk pregnancy and has Ptyalism; Acute diastolic CHF (congestive heart failure) (HCC); Essential hypertension; Morbid obesity with BMI of 50.0-59.9, adult (HCC); Supervision of high risk pregnancy, antepartum; History of C-section; Sickle cell trait (HCC); Chronic hypertension affecting pregnancy; Blood transfusion without reported diagnosis; Acid reflux; SOB (shortness of breath); Heart failure with preserved ejection fraction (HCC); Morbid obesity (HCC); Alpha thalassemia silent carrier; Red Chart Rounds Patient; and Diet controlled gestational diabetes mellitus (GDM) in third trimester on their problem list.  Patient reports general discomforts of pregnancy, increased SOB.  Contractions: Not present. Vag. Bleeding: None.  Movement: Present. Denies leaking of fluid.   The following portions of the patient's history were reviewed and updated as appropriate: allergies, current medications, past family history, past medical history, past social history, past surgical history and problem list. Problem list updated.  Objective:   Vitals:   06/22/21 0833  BP: (!) 148/88  Pulse: (!) 101  Weight: 291 lb 4.8 oz (132.1 kg)    Fetal Status: Fetal Heart Rate (bpm): 135   Movement: Present     General:  Alert, oriented and cooperative. Patient is in no acute distress.  Skin: Skin is warm and dry. No rash noted.   Cardiovascular: Normal heart rate noted  Respiratory: Normal respiratory effort, no problems with respiration noted  Abdomen: Soft, gravid, appropriate for gestational age. Pain/Pressure: Absent     Pelvic:  Cervical exam deferred        Extremities: Normal range of motion.  Edema: Mild pitting, slight indentation  Mental Status: Normal mood and affect. Normal behavior. Normal judgment and thought content.   Urinalysis:       Assessment and Plan:  Pregnancy: G3P1011 at [redacted]w[redacted]d  1. Supervision of high risk pregnancy, antepartum Stable GBS next visit  2. CHTN Stable Continue with antenatal testing and serial growth scans as per MFM  3.GDM. CBG's in goal range Antenatal testing and serial growth scans as per MFM  4. CHF with preserved EF Followed by OB cards. Appt next week. Encouraged pt to discussed increased SOB. Will begin [facility] 06/24/21  5. H/O c section  For TOLAC  Preterm labor symptoms and general obstetric precautions including but not limited to vaginal bleeding, contractions, leaking of fluid and fetal movement were reviewed in detail with the patient. Please refer to After Visit Summary for other counseling recommendations.  Return in about 1 week (around 06/29/2021) for OB visit, face to face, MD only.   Hermina Staggers, MD

## 2021-06-22 NOTE — Progress Notes (Signed)
Pt reports fetal movement, denies pain. Pt last checked fasting BG on Monday, reports fasting as 97. Denies headaches, dizziness, blurred vision. Pt reports in the last 2 weeks she has had difficulty breathing with walking and daily activities.

## 2021-06-23 ENCOUNTER — Telehealth: Payer: Self-pay

## 2021-06-23 NOTE — Telephone Encounter (Signed)
S/w patient about paperwork, pt last day working will be 06/24/21. Advised of typical turn around time for paper work to be completed.

## 2021-06-27 ENCOUNTER — Other Ambulatory Visit: Payer: Self-pay | Admitting: Obstetrics and Gynecology

## 2021-06-29 ENCOUNTER — Other Ambulatory Visit: Payer: Self-pay

## 2021-06-29 ENCOUNTER — Encounter: Payer: Self-pay | Admitting: *Deleted

## 2021-06-29 ENCOUNTER — Telehealth (INDEPENDENT_AMBULATORY_CARE_PROVIDER_SITE_OTHER): Payer: BC Managed Care – PPO | Admitting: Cardiology

## 2021-06-29 ENCOUNTER — Ambulatory Visit: Payer: BC Managed Care – PPO | Admitting: *Deleted

## 2021-06-29 ENCOUNTER — Telehealth: Payer: Medicaid Other | Admitting: Cardiology

## 2021-06-29 ENCOUNTER — Encounter: Payer: Self-pay | Admitting: Cardiology

## 2021-06-29 ENCOUNTER — Ambulatory Visit: Payer: BC Managed Care – PPO | Attending: Maternal & Fetal Medicine

## 2021-06-29 VITALS — BP 139/88 | Ht 59.0 in | Wt 291.0 lb

## 2021-06-29 VITALS — BP 129/85 | HR 108

## 2021-06-29 DIAGNOSIS — O99413 Diseases of the circulatory system complicating pregnancy, third trimester: Secondary | ICD-10-CM | POA: Diagnosis not present

## 2021-06-29 DIAGNOSIS — Z6841 Body Mass Index (BMI) 40.0 and over, adult: Secondary | ICD-10-CM

## 2021-06-29 DIAGNOSIS — I10 Essential (primary) hypertension: Secondary | ICD-10-CM | POA: Diagnosis not present

## 2021-06-29 DIAGNOSIS — O0990 Supervision of high risk pregnancy, unspecified, unspecified trimester: Secondary | ICD-10-CM

## 2021-06-29 DIAGNOSIS — O24415 Gestational diabetes mellitus in pregnancy, controlled by oral hypoglycemic drugs: Secondary | ICD-10-CM | POA: Insufficient documentation

## 2021-06-29 DIAGNOSIS — O34219 Maternal care for unspecified type scar from previous cesarean delivery: Secondary | ICD-10-CM | POA: Insufficient documentation

## 2021-06-29 DIAGNOSIS — I509 Heart failure, unspecified: Secondary | ICD-10-CM

## 2021-06-29 DIAGNOSIS — Z98891 History of uterine scar from previous surgery: Secondary | ICD-10-CM | POA: Insufficient documentation

## 2021-06-29 DIAGNOSIS — D563 Thalassemia minor: Secondary | ICD-10-CM | POA: Diagnosis not present

## 2021-06-29 DIAGNOSIS — O0993 Supervision of high risk pregnancy, unspecified, third trimester: Secondary | ICD-10-CM | POA: Diagnosis not present

## 2021-06-29 DIAGNOSIS — O99213 Obesity complicating pregnancy, third trimester: Secondary | ICD-10-CM

## 2021-06-29 DIAGNOSIS — O10013 Pre-existing essential hypertension complicating pregnancy, third trimester: Secondary | ICD-10-CM | POA: Insufficient documentation

## 2021-06-29 DIAGNOSIS — Z3A36 36 weeks gestation of pregnancy: Secondary | ICD-10-CM | POA: Diagnosis not present

## 2021-06-29 DIAGNOSIS — E669 Obesity, unspecified: Secondary | ICD-10-CM

## 2021-06-29 DIAGNOSIS — O10919 Unspecified pre-existing hypertension complicating pregnancy, unspecified trimester: Secondary | ICD-10-CM | POA: Diagnosis not present

## 2021-06-29 NOTE — Patient Instructions (Signed)
Medication Instructions:  Your physician recommends that you continue on your current medications as directed. Please refer to the Current Medication list given to you today.  *If you need a refill on your cardiac medications before your next appointment, please call your pharmacy*   Lab Work: None If you have labs (blood work) drawn today and your tests are completely normal, you will receive your results only by: MyChart Message (if you have MyChart) OR A paper copy in the mail If you have any lab test that is abnormal or we need to change your treatment, we will call you to review the results.   Testing/Procedures: None   Follow-Up: At [facility], you and your health needs are our priority.  As part of our continuing mission to provide you with exceptional heart care, we have created designated Provider Care Teams.  These Care Teams include your primary Cardiologist (physician) and Advanced Practice Providers (APPs -  Physician Assistants and Nurse Practitioners) who all work together to provide you with the care you need, when you need it.  We recommend signing up for the patient portal called "MyChart".  Sign up information is provided on this After Visit Summary.  MyChart is used to connect with patients for Virtual Visits (Telemedicine).  Patients are able to view lab/test results, encounter notes, upcoming appointments, etc.  Non-urgent messages can be sent to your provider as well.   To learn more about what you can do with MyChart, go to [email or web address].    Your next appointment:   Oct 28th at 1:00pm  The format for your next appointment:   In Person  Provider:   Thomasene Ripple  [facility] [address] [place], [place] [address]    Other Instructions

## 2021-06-29 NOTE — Progress Notes (Addendum)
[facility]  Follow Up Note   Date:  06/29/2021   ID:  Suzanne [name], DOB 02-22-1986, MRN [id number]  PCP:  [name], NP   [facility] Providers Cardiologist:  [name], DO  Electrophysiologist:  None        Referring MD: [name], NP   Chief Complaint: I am doing well  Virtual Visit via Video  Note . I connected with the patient today by a   video enabled telemedicine application and verified that I am speaking with the correct person using two identifiers.  The patient is home. I am in the office.   History of Present Illness:    Suzanne [name] is a 35 y.o. female [G3P1011] who returns for follow up of her chronic hypertension and history of heart failure with preserved ejection fraction.  She is currently 36 weeks and 6 days pregnant.  She offers no complaints at this time.   initially saw the patient on Feb 11, 2021 at that time she was referred to the cardio obstetrics clinic given her history of heart failure with preserved ejection fraction and hypertension.  At that visit she had been off hypertensive medications and her blood pressure was acceptable.  No medication was started.  She did have bilateral leg edema which was suspected to be dependent edema from her work.  We ordered a repeat echocardiogram.   I saw the patient on June 01, 2021 at that time she has had her echocardiogram which showed normal ejection fraction.  Prior CV Studies Reviewed: The following studies were reviewed today:  TTE 02/2021 IMPRESSIONS   1. Left ventricular ejection fraction, by estimation, is 60 to 65%. The left ventricle has normal function. The left ventricle has no regional wall motion abnormalities. Left ventricular diastolic parameters were normal. The average left ventricular  global longitudinal strain is -23.7 %. The global longitudinal strain is normal.   2. Right ventricular systolic function is normal. The right ventricular  size  is normal. Tricuspid regurgitation signal is inadequate for assessing  PA pressure.   3. The mitral valve is normal in structure. No evidence of mitral valve  regurgitation. No evidence of mitral stenosis.   4. The aortic valve is normal in structure. Aortic valve regurgitation is  not visualized. No aortic stenosis is present.   5. The inferior vena cava is normal in size with greater than 50%  respiratory variability, suggesting right atrial pressure of 3 mmHg.   FINDINGS   Left Ventricle: Left ventricular ejection fraction, by estimation, is 60  to 65%. The left ventricle has normal function. The left ventricle has no  regional wall motion abnormalities. The average left ventricular global  longitudinal strain is -23.7 %.  The global longitudinal strain is normal. The left ventricular internal  cavity size was normal in size. There is no left ventricular hypertrophy.  Left ventricular diastolic parameters were normal. Normal left ventricular  filling pressure.   Right Ventricle: The right ventricular size is normal. No increase in  right ventricular wall thickness. Right ventricular systolic function is  normal. Tricuspid regurgitation signal is inadequate for assessing PA  pressure.   Left Atrium: Left atrial size was normal in size.   Right Atrium: Right atrial size was normal in size.   Pericardium: There is no evidence of pericardial effusion.   Mitral Valve: The mitral valve is normal in structure. No evidence of  mitral valve regurgitation. No evidence of mitral valve stenosis.  Tricuspid Valve: The tricuspid valve is normal in structure. Tricuspid  valve regurgitation is not demonstrated. No evidence of tricuspid  stenosis.   Aortic Valve: The aortic valve is normal in structure. Aortic valve  regurgitation is not visualized. No aortic stenosis is present.   Pulmonic Valve: The pulmonic valve was normal in structure. Pulmonic valve  regurgitation is trivial. No  evidence of pulmonic stenosis.   Aorta: The aortic root is normal in size and structure.   Venous: The inferior vena cava is normal in size with greater than 50%  respiratory variability, suggesting right atrial pressure of 3 mmHg.   IAS/Shunts: No atrial level shunt detected by color flow Doppler.    Past Medical History:  Diagnosis Date   Acid reflux    Blood transfusion without reported diagnosis    Hypertension    Sickle cell trait [facility])     Past Surgical History:  Procedure Laterality Date   CESAREAN SECTION N/A 10/08/2013   Procedure: CESAREAN SECTION;  Surgeon: Lahoma Crocker, MD;  Location: [place] [facility];  Service: Obstetrics;  Laterality: N/A;      Current Medications: Current Meds  Medication Sig   Accu-Chek Softclix Lancets lancets Use 1 lancet to check blood sugar 4 times daily   aspirin EC 81 MG tablet Take 1 tablet (81 mg total) by mouth daily. Swallow whole.   Blood Glucose Monitoring Suppl (ACCU-CHEK GUIDE) w/Device KIT Use glucose meter to check blood sugar 4 times daily   glucose blood (ACCU-CHEK GUIDE) test strip Use 1 test strip to check blood sugar 4 times daily   glycopyrrolate (ROBINUL) 1 MG tablet TAKE 1 TABLET BY MOUTH 3 TIMES DAILY.   loratadine (CLARITIN) 10 MG tablet Take 1 tablet (10 mg total) by mouth daily.   metFORMIN (GLUCOPHAGE) 500 MG tablet Take 1 tablet (500 mg total) by mouth daily with supper.   pantoprazole (PROTONIX) 40 MG tablet Take 1 tablet (40 mg total) by mouth daily.   Prenat-Fe Poly-Methfol-FA-DHA (VITAFOL ULTRA) 29-0.6-0.4-200 MG CAPS Take 1 capsule by mouth daily before breakfast.     Allergies:   Hydralazine and Lasix [furosemide]   Social History   Socioeconomic History   Marital status: Single    Spouse name: Not on file   Number of children: Not on file   Years of education: Not on file   Highest education level: Not on file  Occupational History   Not on file  Tobacco Use   Smoking status: Never   Smokeless  tobacco: Never  Vaping Use   Vaping Use: Never used  Substance and Sexual Activity   Alcohol use: No   Drug use: No   Sexual activity: Yes    Partners: Male    Birth control/protection: None  Other Topics Concern   Not on file  Social History Narrative   Completed some collage   Works at [employer]   Has one son, 1 years.   Enjoys playing with her son, sleeping.   Family is established here as well.   Social Determinants of Health   Financial Resource Strain: Low Risk    Difficulty of Paying Living Expenses: Not hard at all  Food Insecurity: No Food Insecurity   Worried About Charity fundraiser in the Last Year: Never true   [place] in the Last Year: Never true  Transportation Needs: No Transportation Needs   Lack of Transportation (Medical): No   Lack of Transportation (Non-Medical): No  Physical  Activity: Unknown   Days of Exercise per Week: 0 days   Minutes of Exercise per Session: Not on file  Stress: Stress Concern Present   Feeling of Stress : To some extent  Social Connections: Unknown   Frequency of Communication with Friends and Family: More than three times a week   Frequency of Social Gatherings with Friends and Family: More than three times a week   Attends Religious Services: Not on Electrical engineer or Organizations: Not on file   Attends Archivist Meetings: Not on file   Marital Status: Not on file      Family History  Problem Relation Age of Onset   Diabetes Mother    Arthritis Mother    Hypertension Mother    Diabetes Maternal Grandmother    Hypertension Maternal Grandmother    Cancer Neg Hx    Heart disease Neg Hx       ROS:   Please see the history of present illness.     All other systems reviewed and are negative.   Labs/EKG Reviewed:    EKG:   EKG is was not ordered today.    Recent Labs: 05/04/2021: Hemoglobin 10.3; Platelets 292 06/01/2021: BUN 4; Creatinine, Ser 0.46; Magnesium  1.8; Potassium 4.3; Sodium 135   Recent Lipid Panel Lab Results  Component Value Date/Time   CHOL 155 10/23/2018 10:04 AM   TRIG 87.0 10/23/2018 10:04 AM   HDL 34.90 (L) 10/23/2018 10:04 AM   CHOLHDL 4 10/23/2018 10:04 AM   LDLCALC 103 (H) 10/23/2018 10:04 AM    Physical Exam:    VS:  BP 139/88   Ht '4\' 11"'  (1.499 m)   Wt 291 lb (132 kg)   LMP 10/17/2020   BMI 58.77 kg/m     Wt Readings from Last 3 Encounters:  06/29/21 291 lb (132 kg)  06/22/21 291 lb 4.8 oz (132.1 kg)  06/01/21 281 lb (127.5 kg)     Virtual visit physical exam not performed  Risk Assessment/Risk Calculators:         ASSESSMENT & PLAN:    History of chronic hypertension History of diastolic heart failure most recent EF 60 to 65% Morbid obesity  We would need to monitor her blood pressure closely.  She tells me at home her systolics is running between the 120s and 130s millimeters mercury.  With her history of chronic diastolic heart failure she would benefit from diuretics postpartum giving her fluid shifts.  She tells me that she has had good success with IV Lasix but had reaction to p.o. Lasix.  At discharge postpartum it would be beneficial to discharge the patient on torsemide  I plan to follow the patient closely postpartum.   Patient Instructions  Medication Instructions:  Your physician recommends that you continue on your current medications as directed. Please refer to the Current Medication list given to you today.  *If you need a refill on your cardiac medications before your next appointment, please call your pharmacy*   Lab Work: None If you have labs (blood work) drawn today and your tests are completely normal, you will receive your results only by: [place] (if you have MyChart) OR A paper copy in the mail If you have any lab test that is abnormal or we need to change your treatment, we will call you to review the  results.   Testing/Procedures: None   Follow-Up: At [facility], you and your health needs are our  priority.  As part of our continuing mission to provide you with exceptional heart care, we have created designated Provider Care Teams.  These Care Teams include your primary Cardiologist (physician) and Advanced Practice Providers (APPs -  Physician Assistants and Nurse Practitioners) who all work together to provide you with the care you need, when you need it.  We recommend signing up for the patient portal called "MyChart".  Sign up information is provided on this After Visit Summary.  MyChart is used to connect with patients for Virtual Visits (Telemedicine).  Patients are able to view lab/test results, encounter notes, upcoming appointments, etc.  Non-urgent messages can be sent to your provider as well.   To learn more about what you can do with MyChart, go to [email or web address].    Your next appointment:   Oct 28th at 1:00pm  The format for your next appointment:   In Person  Provider:   [name]  [facility] [address], [place], [place] [address]    Other Instructions    Dispo:  No follow-ups on file.   Medication Adjustments/Labs and Tests Ordered: Current medicines are reviewed at length with the patient today.  Concerns regarding medicines are outlined above.  Tests Ordered: No orders of the defined types were placed in this encounter.  Medication Changes: No orders of the defined types were placed in this encounter.

## 2021-07-01 ENCOUNTER — Other Ambulatory Visit (HOSPITAL_COMMUNITY)
Admission: RE | Admit: 2021-07-01 | Discharge: 2021-07-01 | Disposition: A | Discharge status: Home / Self Care | Payer: BC Managed Care – PPO | Source: Ambulatory Visit | Attending: Obstetrics & Gynecology | Admitting: Obstetrics & Gynecology

## 2021-07-01 ENCOUNTER — Ambulatory Visit (INDEPENDENT_AMBULATORY_CARE_PROVIDER_SITE_OTHER): Payer: Medicaid Other | Admitting: Obstetrics & Gynecology

## 2021-07-01 ENCOUNTER — Other Ambulatory Visit: Payer: Self-pay

## 2021-07-01 VITALS — BP 130/86 | HR 102 | Wt 290.3 lb

## 2021-07-01 DIAGNOSIS — Z98891 History of uterine scar from previous surgery: Secondary | ICD-10-CM

## 2021-07-01 DIAGNOSIS — O0990 Supervision of high risk pregnancy, unspecified, unspecified trimester: Secondary | ICD-10-CM

## 2021-07-01 DIAGNOSIS — O10919 Unspecified pre-existing hypertension complicating pregnancy, unspecified trimester: Secondary | ICD-10-CM

## 2021-07-01 DIAGNOSIS — O0993 Supervision of high risk pregnancy, unspecified, third trimester: Secondary | ICD-10-CM | POA: Diagnosis not present

## 2021-07-01 DIAGNOSIS — Z3A36 36 weeks gestation of pregnancy: Secondary | ICD-10-CM | POA: Insufficient documentation

## 2021-07-01 DIAGNOSIS — I5031 Acute diastolic (congestive) heart failure: Secondary | ICD-10-CM

## 2021-07-01 DIAGNOSIS — O24410 Gestational diabetes mellitus in pregnancy, diet controlled: Secondary | ICD-10-CM

## 2021-07-01 DIAGNOSIS — Z7189 Other specified counseling: Secondary | ICD-10-CM

## 2021-07-01 NOTE — Progress Notes (Signed)
36.[redacted]wks GA No unusual complaints. GBS and GC/CC today

## 2021-07-01 NOTE — Progress Notes (Signed)
   PRENATAL VISIT NOTE  Subjective:  Suzanne [name] is a 35 y.o. G3P1011 at [redacted]w[redacted]d being seen today for ongoing prenatal care.  She is currently monitored for the following issues for this high-risk pregnancy and has Ptyalism; Acute diastolic CHF (congestive heart failure) (HCC); Essential hypertension; Morbid obesity with BMI of 50.0-59.9, adult (HCC); Supervision of high risk pregnancy, antepartum; History of C-section; Sickle cell trait (HCC); Chronic hypertension affecting pregnancy; Acid reflux; SOB (shortness of breath); Heart failure with preserved ejection fraction (HCC); Morbid obesity (HCC); Alpha thalassemia silent carrier; Red Chart Rounds Patient; and Diet controlled gestational diabetes mellitus (GDM) in third trimester on their problem list.  Patient reports spitting.  Contractions: Not present. Vag. Bleeding: None.  Movement: Present. Denies leaking of fluid.   The following portions of the patient's history were reviewed and updated as appropriate: allergies, current medications, past family history, past medical history, past social history, past surgical history and problem list.   Objective:   Vitals:   07/01/21 1000  BP: 130/86  Pulse: (!) 102  Weight: 290 lb 4.8 oz (131.7 kg)    Fetal Status:     Movement: Present     General:  Alert, oriented and cooperative. Patient is in no acute distress.  Skin: Skin is warm and dry. No rash noted.   Cardiovascular: Normal heart rate noted  Respiratory: Normal respiratory effort, no problems with respiration noted  Abdomen: Soft, gravid, appropriate for gestational age.  Pain/Pressure: Present     Pelvic: Cervical exam performed in the presence of a chaperone        Extremities: Normal range of motion.  Edema: Trace  Mental Status: Normal mood and affect. Normal behavior. Normal judgment and thought content.   Assessment and Plan:  Pregnancy: G3P1011 at [redacted]w[redacted]d 1. Diet controlled gestational diabetes mellitus (GDM) in third  trimester Good control on diet  2. Red Chart Rounds Patient HF  3. Chronic hypertension affecting pregnancy Control is acceptible  4. History of C-section Plans TOLAC and discussed benefits if successful  5. Acute diastolic CHF (congestive heart failure) (HCC) Stable no complaint  Preterm labor symptoms and general obstetric precautions including but not limited to vaginal bleeding, contractions, leaking of fluid and fetal movement were reviewed in detail with the patient. Please refer to After Visit Summary for other counseling recommendations.   Return in about 1 week (around 07/08/2021).  Future Appointments  Date Time Provider Department Center  07/06/2021  9:45 AM [facility] NURSE [facility]  07/06/2021 10:00 AM [facility]  07/13/2021  9:30 AM [facility] NURSE [facility]  07/13/2021  9:45 AM [facility] US4 [facility]  07/22/2021  1:00 PM Tobb, Lavona Mound, DO [facility] None    Scheryl Darter, MD

## 2021-07-03 LAB — STREP GP B NAA: Strep Gp B NAA: NEGATIVE

## 2021-07-04 LAB — CERVICOVAGINAL ANCILLARY ONLY
Chlamydia: NEGATIVE
Comment: NEGATIVE
Comment: NORMAL
Neisseria Gonorrhea: NEGATIVE

## 2021-07-06 ENCOUNTER — Ambulatory Visit (HOSPITAL_BASED_OUTPATIENT_CLINIC_OR_DEPARTMENT_OTHER): Payer: BC Managed Care – PPO

## 2021-07-06 ENCOUNTER — Encounter: Payer: Self-pay | Admitting: *Deleted

## 2021-07-06 ENCOUNTER — Other Ambulatory Visit: Payer: Self-pay

## 2021-07-06 ENCOUNTER — Ambulatory Visit: Payer: BC Managed Care – PPO | Admitting: *Deleted

## 2021-07-06 VITALS — BP 140/103 | HR 113

## 2021-07-06 DIAGNOSIS — Z98891 History of uterine scar from previous surgery: Secondary | ICD-10-CM | POA: Insufficient documentation

## 2021-07-06 DIAGNOSIS — O0990 Supervision of high risk pregnancy, unspecified, unspecified trimester: Secondary | ICD-10-CM | POA: Insufficient documentation

## 2021-07-06 DIAGNOSIS — D563 Thalassemia minor: Secondary | ICD-10-CM | POA: Insufficient documentation

## 2021-07-06 DIAGNOSIS — I509 Heart failure, unspecified: Secondary | ICD-10-CM | POA: Insufficient documentation

## 2021-07-06 DIAGNOSIS — Z6841 Body Mass Index (BMI) 40.0 and over, adult: Secondary | ICD-10-CM | POA: Insufficient documentation

## 2021-07-06 DIAGNOSIS — O99213 Obesity complicating pregnancy, third trimester: Secondary | ICD-10-CM | POA: Diagnosis not present

## 2021-07-06 DIAGNOSIS — O24415 Gestational diabetes mellitus in pregnancy, controlled by oral hypoglycemic drugs: Secondary | ICD-10-CM | POA: Insufficient documentation

## 2021-07-06 DIAGNOSIS — O34219 Maternal care for unspecified type scar from previous cesarean delivery: Secondary | ICD-10-CM

## 2021-07-06 DIAGNOSIS — Z3A37 37 weeks gestation of pregnancy: Secondary | ICD-10-CM

## 2021-07-06 DIAGNOSIS — E669 Obesity, unspecified: Secondary | ICD-10-CM

## 2021-07-07 ENCOUNTER — Inpatient Hospital Stay (HOSPITAL_COMMUNITY)
Admission: AD | Admit: 2021-07-07 | Discharge: 2021-07-13 | DRG: 787 | Disposition: A | Discharge status: Home / Self Care | Payer: BC Managed Care – PPO | Attending: Family Medicine | Admitting: Family Medicine

## 2021-07-07 ENCOUNTER — Encounter (HOSPITAL_COMMUNITY): Payer: Self-pay | Admitting: Obstetrics and Gynecology

## 2021-07-07 ENCOUNTER — Other Ambulatory Visit: Payer: Self-pay

## 2021-07-07 DIAGNOSIS — I5032 Chronic diastolic (congestive) heart failure: Secondary | ICD-10-CM | POA: Diagnosis not present

## 2021-07-07 DIAGNOSIS — O43893 Other placental disorders, third trimester: Secondary | ICD-10-CM | POA: Diagnosis not present

## 2021-07-07 DIAGNOSIS — O1002 Pre-existing essential hypertension complicating childbirth: Secondary | ICD-10-CM | POA: Diagnosis not present

## 2021-07-07 DIAGNOSIS — Z3A37 37 weeks gestation of pregnancy: Secondary | ICD-10-CM | POA: Diagnosis not present

## 2021-07-07 DIAGNOSIS — O9081 Anemia of the puerperium: Secondary | ICD-10-CM | POA: Diagnosis not present

## 2021-07-07 DIAGNOSIS — K219 Gastro-esophageal reflux disease without esophagitis: Secondary | ICD-10-CM | POA: Diagnosis present

## 2021-07-07 DIAGNOSIS — E66813 Obesity, class 3: Secondary | ICD-10-CM

## 2021-07-07 DIAGNOSIS — O24420 Gestational diabetes mellitus in childbirth, diet controlled: Secondary | ICD-10-CM | POA: Diagnosis not present

## 2021-07-07 DIAGNOSIS — O99214 Obesity complicating childbirth: Secondary | ICD-10-CM | POA: Diagnosis present

## 2021-07-07 DIAGNOSIS — Z23 Encounter for immunization: Secondary | ICD-10-CM

## 2021-07-07 DIAGNOSIS — O1092 Unspecified pre-existing hypertension complicating childbirth: Secondary | ICD-10-CM | POA: Diagnosis not present

## 2021-07-07 DIAGNOSIS — K117 Disturbances of salivary secretion: Secondary | ICD-10-CM | POA: Diagnosis present

## 2021-07-07 DIAGNOSIS — O411230 Chorioamnionitis, third trimester, not applicable or unspecified: Secondary | ICD-10-CM | POA: Diagnosis not present

## 2021-07-07 DIAGNOSIS — D563 Thalassemia minor: Secondary | ICD-10-CM

## 2021-07-07 DIAGNOSIS — Z20822 Contact with and (suspected) exposure to covid-19: Secondary | ICD-10-CM | POA: Diagnosis present

## 2021-07-07 DIAGNOSIS — D573 Sickle-cell trait: Secondary | ICD-10-CM | POA: Diagnosis present

## 2021-07-07 DIAGNOSIS — O0990 Supervision of high risk pregnancy, unspecified, unspecified trimester: Secondary | ICD-10-CM

## 2021-07-07 DIAGNOSIS — Z3A38 38 weeks gestation of pregnancy: Secondary | ICD-10-CM | POA: Diagnosis not present

## 2021-07-07 DIAGNOSIS — O9962 Diseases of the digestive system complicating childbirth: Secondary | ICD-10-CM | POA: Diagnosis present

## 2021-07-07 DIAGNOSIS — O119 Pre-existing hypertension with pre-eclampsia, unspecified trimester: Secondary | ICD-10-CM

## 2021-07-07 DIAGNOSIS — I5030 Unspecified diastolic (congestive) heart failure: Secondary | ICD-10-CM | POA: Diagnosis present

## 2021-07-07 DIAGNOSIS — Z0542 Observation and evaluation of newborn for suspected metabolic condition ruled out: Secondary | ICD-10-CM | POA: Diagnosis not present

## 2021-07-07 DIAGNOSIS — O34211 Maternal care for low transverse scar from previous cesarean delivery: Secondary | ICD-10-CM | POA: Diagnosis present

## 2021-07-07 DIAGNOSIS — O1414 Severe pre-eclampsia complicating childbirth: Secondary | ICD-10-CM | POA: Diagnosis not present

## 2021-07-07 DIAGNOSIS — O10913 Unspecified pre-existing hypertension complicating pregnancy, third trimester: Secondary | ICD-10-CM

## 2021-07-07 DIAGNOSIS — Z98891 History of uterine scar from previous surgery: Secondary | ICD-10-CM

## 2021-07-07 DIAGNOSIS — D62 Acute posthemorrhagic anemia: Secondary | ICD-10-CM | POA: Diagnosis not present

## 2021-07-07 DIAGNOSIS — O24410 Gestational diabetes mellitus in pregnancy, diet controlled: Secondary | ICD-10-CM | POA: Diagnosis present

## 2021-07-07 DIAGNOSIS — O114 Pre-existing hypertension with pre-eclampsia, complicating childbirth: Principal | ICD-10-CM | POA: Diagnosis present

## 2021-07-07 DIAGNOSIS — O113 Pre-existing hypertension with pre-eclampsia, third trimester: Secondary | ICD-10-CM

## 2021-07-07 DIAGNOSIS — Z7189 Other specified counseling: Secondary | ICD-10-CM

## 2021-07-07 DIAGNOSIS — O24424 Gestational diabetes mellitus in childbirth, insulin controlled: Secondary | ICD-10-CM | POA: Diagnosis not present

## 2021-07-07 DIAGNOSIS — O0993 Supervision of high risk pregnancy, unspecified, third trimester: Secondary | ICD-10-CM

## 2021-07-07 LAB — CBC
HCT: 30.5 % — ABNORMAL LOW (ref 36.0–46.0)
HCT: 28.7 % — ABNORMAL LOW (ref 36.0–46.0)
Hemoglobin: 8.9 g/dL — ABNORMAL LOW (ref 12.0–15.0)
Hemoglobin: 8.9 g/dL — ABNORMAL LOW (ref 12.0–15.0)
MCH: 21 pg — ABNORMAL LOW (ref 26.0–34.0)
MCH: 21.8 pg — ABNORMAL LOW (ref 26.0–34.0)
MCHC: 29.2 g/dL — ABNORMAL LOW (ref 30.0–36.0)
MCHC: 31 g/dL (ref 30.0–36.0)
MCV: 71.9 fL — ABNORMAL LOW (ref 80.0–100.0)
MCV: 70.2 fL — ABNORMAL LOW (ref 80.0–100.0)
Platelets: 326 10*3/uL (ref 150–400)
Platelets: 357 10*3/uL (ref 150–400)
RBC: 4.24 MIL/uL (ref 3.87–5.11)
RBC: 4.09 MIL/uL (ref 3.87–5.11)
RDW: 17.2 % — ABNORMAL HIGH (ref 11.5–15.5)
RDW: 17.3 % — ABNORMAL HIGH (ref 11.5–15.5)
WBC: 8.8 10*3/uL (ref 4.0–10.5)
WBC: 10 10*3/uL (ref 4.0–10.5)
nRBC: 0.2 % (ref 0.0–0.2)
nRBC: 0.3 % — ABNORMAL HIGH (ref 0.0–0.2)

## 2021-07-07 LAB — COMPREHENSIVE METABOLIC PANEL
ALT: 15 U/L (ref 0–44)
AST: 16 U/L (ref 15–41)
Albumin: 2.5 g/dL — ABNORMAL LOW (ref 3.5–5.0)
Alkaline Phosphatase: 102 U/L (ref 38–126)
Anion gap: 9 (ref 5–15)
BUN: 5 mg/dL — ABNORMAL LOW (ref 6–20)
CO2: 23 mmol/L (ref 22–32)
Calcium: 9.3 mg/dL (ref 8.9–10.3)
Chloride: 104 mmol/L (ref 98–111)
Creatinine, Ser: 0.51 mg/dL (ref 0.44–1.00)
GFR, Estimated: >60 mL/min (ref >60)
Glucose, Bld: 87 mg/dL (ref 70–99)
Potassium: 4 mmol/L (ref 3.5–5.1)
Sodium: 136 mmol/L (ref 135–145)
Total Bilirubin: 0.4 mg/dL (ref 0.3–1.2)
Total Protein: 6.4 g/dL — ABNORMAL LOW (ref 6.5–8.1)

## 2021-07-07 LAB — TYPE AND SCREEN
ABO/RH(D): A POS
Antibody Screen: NEGATIVE

## 2021-07-07 LAB — URINALYSIS, ROUTINE W REFLEX MICROSCOPIC
Bilirubin Urine: NEGATIVE
Glucose, UA: NEGATIVE mg/dL
Hgb urine dipstick: NEGATIVE
Ketones, ur: 5 mg/dL — AB
Leukocytes,Ua: NEGATIVE
Nitrite: NEGATIVE
Protein, ur: 30 mg/dL — AB
Specific Gravity, Urine: 1.008 (ref 1.005–1.030)
pH: 7 (ref 5.0–8.0)

## 2021-07-07 LAB — GLUCOSE, CAPILLARY
Glucose-Capillary: 80 mg/dL (ref 70–99)
Glucose-Capillary: 110 mg/dL — ABNORMAL HIGH (ref 70–99)
Glucose-Capillary: 120 mg/dL — ABNORMAL HIGH (ref 70–99)
Glucose-Capillary: 99 mg/dL (ref 70–99)

## 2021-07-07 LAB — PROTEIN / CREATININE RATIO, URINE
Creatinine, Urine: 59.89 mg/dL
Protein Creatinine Ratio: 0.45 mg/mg{Cre} — ABNORMAL HIGH (ref 0.00–0.15)
Total Protein, Urine: 27 mg/dL

## 2021-07-07 LAB — RESP PANEL BY RT-PCR (FLU A&B, COVID) ARPGX2
Influenza A by PCR: NEGATIVE
Influenza B by PCR: NEGATIVE
SARS Coronavirus 2 by RT PCR: NEGATIVE

## 2021-07-07 LAB — RPR: RPR Ser Ql: NONREACTIVE

## 2021-07-07 MED ORDER — LABETALOL HCL 5 MG/ML IV SOLN
40.0000 mg | INTRAVENOUS | Status: DC | PRN
  Administered 2021-07-07: 40 mg via INTRAVENOUS

## 2021-07-07 MED ORDER — LABETALOL HCL 5 MG/ML IV SOLN
40.0000 mg | INTRAVENOUS | Status: DC | PRN
Start: 2021-07-07 — End: 2021-07-10
  Filled 2021-07-07: qty 8

## 2021-07-07 MED ORDER — ACETAMINOPHEN 500 MG PO TABS
1000.0000 mg | ORAL_TABLET | Freq: Once | ORAL | Status: AC
  Administered 2021-07-07: 1000 mg via ORAL
  Filled 2021-07-07: qty 2

## 2021-07-07 MED ORDER — LABETALOL HCL 5 MG/ML IV SOLN: 80.0000 mg | INTRAVENOUS | Status: DC | PRN

## 2021-07-07 MED ORDER — OXYTOCIN BOLUS FROM INFUSION: 333.0000 mL | Freq: Once | INTRAVENOUS | Status: DC

## 2021-07-07 MED ORDER — LABETALOL HCL 5 MG/ML IV SOLN
INTRAVENOUS | Status: AC
  Administered 2021-07-07: 20 mg via INTRAVENOUS
  Filled 2021-07-07: qty 4

## 2021-07-07 MED ORDER — TERBUTALINE SULFATE 1 MG/ML IJ SOLN
0.2500 mg | Freq: Once | INTRAMUSCULAR | Status: AC | PRN
  Administered 2021-07-09: 0.25 mg via SUBCUTANEOUS
  Filled 2021-07-07: qty 1

## 2021-07-07 MED ORDER — MAGNESIUM SULFATE 40 GM/1000ML IV SOLN
1.0000 g/h | INTRAVENOUS | Status: DC
  Administered 2021-07-07 – 2021-07-09 (×4): 2 g/h via INTRAVENOUS
  Administered 2021-07-10: 1 g/h via INTRAVENOUS
  Filled 2021-07-07 (×4): qty 1000

## 2021-07-07 MED ORDER — OXYTOCIN-SODIUM CHLORIDE 30-0.9 UT/500ML-% IV SOLN
1.0000 m[IU]/min | INTRAVENOUS | Status: DC
  Administered 2021-07-07: 6 m[IU]/min via INTRAVENOUS
  Administered 2021-07-07: 2 m[IU]/min via INTRAVENOUS
  Administered 2021-07-09: 4 m[IU]/min via INTRAVENOUS
  Filled 2021-07-07 (×2): qty 500

## 2021-07-07 MED ORDER — LACTATED RINGERS IV SOLN: 500.0000 mL | INTRAVENOUS | Status: DC | PRN

## 2021-07-07 MED ORDER — MAGNESIUM SULFATE BOLUS VIA INFUSION
6.0000 g | Freq: Once | INTRAVENOUS | Status: AC
  Administered 2021-07-07: 6 g via INTRAVENOUS
  Filled 2021-07-07: qty 1000

## 2021-07-07 MED ORDER — LACTATED RINGERS IV SOLN: INTRAVENOUS | Status: DC

## 2021-07-07 MED ORDER — FENTANYL CITRATE (PF) 100 MCG/2ML IJ SOLN
50.0000 ug | INTRAMUSCULAR | Status: DC | PRN
  Administered 2021-07-08 (×3): 100 ug via INTRAVENOUS
  Filled 2021-07-07 (×3): qty 2

## 2021-07-07 MED ORDER — LEVONORGESTREL 20.1 MCG/DAY IU IUD
1.0000 | INTRAUTERINE_SYSTEM | Freq: Once | INTRAUTERINE | Status: DC
Start: 2021-07-07 — End: 2021-07-10

## 2021-07-07 MED ORDER — OXYCODONE-ACETAMINOPHEN 5-325 MG PO TABS: 1.0000 | ORAL_TABLET | ORAL | Status: DC | PRN

## 2021-07-07 MED ORDER — CYCLOBENZAPRINE HCL 5 MG PO TABS
5.0000 mg | ORAL_TABLET | Freq: Once | ORAL | Status: AC
  Administered 2021-07-07: 5 mg via ORAL
  Filled 2021-07-07: qty 1

## 2021-07-07 MED ORDER — LABETALOL HCL 5 MG/ML IV SOLN: 20.0000 mg | INTRAVENOUS | Status: DC | PRN

## 2021-07-07 MED ORDER — LIDOCAINE HCL (PF) 1 % IJ SOLN
30.0000 mL | INTRAMUSCULAR | Status: DC | PRN
Start: 2021-07-07 — End: 2021-07-10

## 2021-07-07 MED ORDER — OXYTOCIN-SODIUM CHLORIDE 30-0.9 UT/500ML-% IV SOLN: 2.5000 [IU]/h | INTRAVENOUS | Status: DC

## 2021-07-07 MED ORDER — OXYCODONE-ACETAMINOPHEN 5-325 MG PO TABS: 2.0000 | ORAL_TABLET | ORAL | Status: DC | PRN

## 2021-07-07 MED ORDER — ACETAMINOPHEN 325 MG PO TABS
650.0000 mg | ORAL_TABLET | ORAL | Status: DC | PRN
  Administered 2021-07-09: 650 mg via ORAL
  Filled 2021-07-07: qty 2

## 2021-07-07 MED ORDER — SOD CITRATE-CITRIC ACID 500-334 MG/5ML PO SOLN
30.0000 mL | ORAL | Status: DC | PRN
  Filled 2021-07-07 (×2): qty 30

## 2021-07-07 MED ORDER — ONDANSETRON HCL 4 MG/2ML IJ SOLN
4.0000 mg | Freq: Four times a day (QID) | INTRAMUSCULAR | Status: DC | PRN
  Administered 2021-07-08 – 2021-07-10 (×7): 4 mg via INTRAVENOUS
  Filled 2021-07-07 (×7): qty 2

## 2021-07-07 NOTE — Progress Notes (Signed)
Labor Progress Note Suzanne [name] is a 35 y.o. G3P1011 at [redacted]w[redacted]d presented for TOLAC/IOL for CHTN superimposed with pre-e with severe features (BP)   S: Doing well. Has had worsening shortness of breath of the course of the last several weeks of pregnancy, has felt more since being on L&D esp with walking to the bathroom and back. No associated cough, wheezing, chest pain.   O:  BP (!) 146/104   Pulse (!) 115   Temp 97.6 F (36.4 C) (Axillary)   Resp (!) 22   Ht 4\' 11"  (1.499 m)   Wt 131.7 kg   LMP 10/17/2020   SpO2 94%   BMI 58.63 kg/m  EFM: 135/mod/occasional 10x10/none Difficult to trace contractions   Gen: Sitting up, appears comfortable  Card: Regular rhythm, tachycardic, no murmur noted  Pulm: CTA b/l without crackles or wheezing noted, faint breath sounds with body habitus. Able to speak in full sentences w/o difficulty. Normal WOB noted at rest and with conversation.   CVE: Dilation: Fingertip Effacement (%): 30 Cervical Position: Posterior Presentation: Vertex Exam by:: Dr. [email or web address]   A&P: 35 y.o. 34 [redacted]w[redacted]d  #Labor: Plan to recheck in the next hour or two, hopeful to place a FB at that time. Cont pit as is.  #Pain: PRN  #FWB: Cat 1  #GBS negative  #cHTN with SIPE + SF: No further severe ranges since this am. Cont labetalol and Mg. Mild HA, but otherwise well.   #Subacute Shortness of breath, in setting of known HFpEF: Suspect multifactorial--Current full term pregnancy with elevated BMI, continuous IV fluids with known HFpEF. D/c'd IV fluids, will cont with fluid admin through pit and Mg only. Monitor strict I&Os. She will receive postpartum IV lasix and d/c with po torsemide at discharge.   #A2GDM: CBGs <120. Cont monitoring.   [redacted]w[redacted]d, DO 9:08 PM

## 2021-07-07 NOTE — Progress Notes (Signed)
Suzanne [name] is a 35 y.o. G3P1011 at [redacted]w[redacted]d  admitted for TOLAC/IOL for preE with SF (by BP), also with GDMA2 (on Metformin), and hx of HFpEF  Subjective: Patient feeling better after getting settled in. Headache still present.   Objective: BP 137/89   Pulse (!) 106   Temp 97.8 F (36.6 C) (Oral)   Resp 16   Ht 4\' 11"  (1.499 m)   Wt 131.7 kg   LMP 10/17/2020   SpO2 98%   BMI 58.63 kg/m  No intake/output data recorded. Total I/O In: 373.3 [I.V.:373.3] Out: 450 [Urine:450]  FHT:  FHR: 130 bpm, variability: moderate,  accelerations:  Abscent,  decelerations:  Absent UC:   none SVE:   Dilation: Closed Effacement (%): Thick Exam by:: Dr. [email or web address]  BSUS: vertex presentation  Labs: Lab Results  Component Value Date   WBC 10.0 07/07/2021   HGB 8.9 (L) 07/07/2021   HCT 28.7 (L) 07/07/2021   MCV 70.2 (L) 07/07/2021   PLT 357 07/07/2021    Assessment / Plan: Induction of labor due to TOLAC/IOL for  preE with SF (by BP), also with GDMA2 (on Metformin), and hx of HFpEF  Labor:  cervix closed, thick, and high. Will start with pitocin and reassess for foley bulb placement in ~3-4 hrs Preeclampsia:  on magnesium sulfate, BP improved to 130s/80s, normal LFTs and Plt. Has residual HA will trial flexeril since too soon for another dose of Tylenol Fetal Wellbeing:  Category I Pain Control:   plans epidural I/D:   GBS neg  #GDM - last BG 80 - continue q4hr then q2hr when in active labor  #Anemia of pregnancy HgB at admission 8.9 (down from 10.3 2 months ago) - Plan for recheck PP and PO vs. IV iron PP  #HFpEF - monitor fluids and edema - currently asymptomatic - Plan for lasix after delivery 07/09/2021 07/07/2021, 2:42 PM

## 2021-07-07 NOTE — MAU Provider Note (Signed)
Chief Complaint  Patient presents with   Hypertension     Event Date/Time   First Provider Initiated Contact with Patient 07/07/21 0825      S: Suzanne [name]  is a 35 y.o. y.o. year old G28P1011 female at [redacted]w[redacted]d weeks gestation who presents to MAU with elevated blood pressures.  Hx of chronic hypertension. Current blood pressure medication: none  Associated symptoms: Denies Headache, denies vision changes, denies epigastric pain Contractions: denies Vaginal bleeding: denies Fetal movement: normal  O: Patient Vitals for the past 24 hrs:  BP Temp Temp src Pulse Resp SpO2  07/07/21 1000 (!) 155/107 -- -- (!) 102 -- 100 %  07/07/21 0945 (!) 144/94 -- -- 94 -- 98 %  07/07/21 0930 (!) 149/98 -- -- 96 -- --  07/07/21 0916 (!) 153/104 -- -- (!) 101 -- 98 %  07/07/21 0900 (!) 163/103 -- -- (!) 106 -- 98 %  07/07/21 0845 (!) 157/105 -- -- (!) 109 -- 98 %  07/07/21 0839 (!) 165/106 -- -- (!) 113 -- --  07/07/21 0820 (!) 158/105 97.9 F (36.6 C) Oral (!) 113 20 98 %   General: NAD Heart: Regular rate Lungs: Normal rate and effort Abd: Soft, NT, Gravid, S=D Extremities: 3+ Pedal edema Neuro: 2+ deep tendon reflexes, No clonus  Fetal Tracing:  Baseline: 135 Variability: moderate Accelerations: 10x10 Decelerations: none  Toco: irregular   Results for orders placed or performed during the hospital encounter of 07/07/21 (from the past 24 hour(s))  Urinalysis, Routine w reflex microscopic Urine, Clean Catch     Status: Abnormal   Collection Time: 07/07/21  8:26 AM  Result Value Ref Range   Color, Urine YELLOW YELLOW   APPearance HAZY (A) CLEAR   Specific Gravity, Urine 1.008 1.005 - 1.030   pH 7.0 5.0 - 8.0   Glucose, UA NEGATIVE NEGATIVE mg/dL   Hgb urine dipstick NEGATIVE NEGATIVE   Bilirubin Urine NEGATIVE NEGATIVE   Ketones, ur 5 (A) NEGATIVE mg/dL   Protein, ur 30 (A) NEGATIVE mg/dL   Nitrite NEGATIVE NEGATIVE   Leukocytes,Ua NEGATIVE NEGATIVE   RBC / HPF 0-5 0 - 5  RBC/hpf   WBC, UA 0-5 0 - 5 WBC/hpf   Bacteria, UA MANY (A) NONE SEEN   Squamous Epithelial / LPF 11-20 0 - 5  Protein / creatinine ratio, urine     Status: Abnormal   Collection Time: 07/07/21  8:26 AM  Result Value Ref Range   Creatinine, Urine 59.89 mg/dL   Total Protein, Urine 27 mg/dL   Protein Creatinine Ratio 0.45 (H) 0.00 - 0.15 mg/mg[Cre]  CBC     Status: Abnormal   Collection Time: 07/07/21  8:36 AM  Result Value Ref Range   WBC 8.8 4.0 - 10.5 K/uL   RBC 4.24 3.87 - 5.11 MIL/uL   Hemoglobin 8.9 (L) 12.0 - 15.0 g/dL   HCT 79.8 (L) 92.1 - 19.4 %   MCV 71.9 (L) 80.0 - 100.0 fL   MCH 21.0 (L) 26.0 - 34.0 pg   MCHC 29.2 (L) 30.0 - 36.0 g/dL   RDW 17.4 (H) 08.1 - 44.8 %   Platelets 326 150 - 400 K/uL   nRBC 0.2 0.0 - 0.2 %  Comprehensive metabolic panel     Status: Abnormal   Collection Time: 07/07/21  8:36 AM  Result Value Ref Range   Sodium 136 135 - 145 mmol/L   Potassium 4.0 3.5 - 5.1 mmol/L   Chloride 104 98 -  111 mmol/L   CO2 23 22 - 32 mmol/L   Glucose, Bld 87 70 - 99 mg/dL   BUN 5 (L) 6 - 20 mg/dL   Creatinine, Ser 2.09 0.44 - 1.00 mg/dL   Calcium 9.3 8.9 - 47.0 mg/dL   Total Protein 6.4 (L) 6.5 - 8.1 g/dL   Albumin 2.5 (L) 3.5 - 5.0 g/dL   AST 16 15 - 41 U/L   ALT 15 0 - 44 U/L   Alkaline Phosphatase 102 38 - 126 U/L   Total Bilirubin 0.4 0.3 - 1.2 mg/dL   GFR, Estimated >96 >28 mL/min   Anion gap 9 5 - 15    MDM Severe range BPs responded to 1 dose of IV labetalol 20 mg. Initially was asymptomatic but developed 5/10 headache while in MAU. Given dose of tylenol 1 gm.  Per consult with Dr. Jolayne Panther, will admit to birthing suites.   A:  1. Chronic hypertension with superimposed preeclampsia   2. [redacted] weeks gestation of pregnancy     P:  Admit to birthing suites Care turned over to Dr. Glory Rosebush, Denny Peon, NP 07/07/2021 10:13 AM

## 2021-07-07 NOTE — MAU Note (Signed)
...  Suzanne [name] is a 35 y.o. at [redacted]w[redacted]d here in MAU reporting: elevated BP's since her ultrasound visit yesterday. She states it was elevated at the office and states, "They were going to send me here but it went down to 145 over something before I left so they said I could go home and just to watch it." She states throughout the night she had headaches but denies one currently. Denies RUQ pain and visual disturbances. She states when she woke up at 0500 this morning she immediately took her BP and it was 156/99 so she decided to come in. +FM. No VB or LOF.  Pain score:  Denies any pain.  FHT: 135 initial external Lab orders placed from triage: UA

## 2021-07-07 NOTE — Progress Notes (Signed)
Suzanne [name] is a 35 y.o. G3P1011 at [redacted]w[redacted]d admitted for TOLAC/IOL for preE with SF (by BP), also with GDMA2 (on Metformin), and hx of HFpEF   Subjective: Patient reports headache is a little improved with Flexeril. Feeling some mild tightening of abdomen at times. Not too strong  Objective: BP (!) 142/87   Pulse (!) 107   Temp (!) 97.4 F (36.3 C) (Oral)   Resp 20   Ht 4\' 11"  (1.499 m)   Wt 131.7 kg   LMP 10/17/2020   SpO2 98%   BMI 58.63 kg/m  I/O last 3 completed shifts: In: 811.3 [P.O.:118; I.V.:573.3; Other:120] Out: 1600 [Urine:1600] No intake/output data recorded.  FHT:  FHR: 130 bpm, variability: moderate,  accelerations:  Abscent,  decelerations:  Absent UC:   not tracing on monitor SVE:   Dilation: Fingertip Effacement (%): 30 Exam by:: Dr. [email or web address]  Labs: Lab Results  Component Value Date   WBC 10.0 07/07/2021   HGB 8.9 (L) 07/07/2021   HCT 28.7 (L) 07/07/2021   MCV 70.2 (L) 07/07/2021   PLT 357 07/07/2021    Assessment / Plan: Induction of labor due TOLAC/IOL for preE with SF (by BP), also with GDMA2 (on Metformin), and hx of HFpEF,  on pitocin  Labor:  External os fingertip, internal os closed, will continue pitocin and assess for foley at next check Preeclampsia:  on magnesium sulfate Fetal Wellbeing:  Category I Pain Control:  IV pain meds and desires epidural I/D:   GBS negative Anticipated MOD:   attempting for VBAC  07/09/2021 07/07/2021, 7:08 PM

## 2021-07-07 NOTE — H&P (Addendum)
OBSTETRIC ADMISSION HISTORY AND PHYSICAL  Suzanne [name] is a 35 y.o. female G63P1011 with IUP at 19w4dby LMP presenting for elevated blood pressures and being admitted for TOLAC/IOL for cHTN with si preE with SF. She has had intermittent severe range blood pressures since arriving to the MAU. She reports moderate headache. Otherwise +FMs, No LOF, no VB, no blurry vision, no peripheral edema, and no RUQ pain.  She plans on bottle feeding. She request pp liletta IUD for birth control. [facility]  Of note, patient has a history of heart failure with preserved EF that was diagnosed in the post partum period of her last pregnancy. She reports she was given IV Lasix at that time which she tolerated. She does report an allergic reaction (facial swelling) to PO Lasix. [facility] who recommended post partum diuresis with IV lasix and then consider PO Torsemide.  Dating: By LMP --->  Estimated Date of Delivery: 07/24/21  Sono:   _0 , CWD, normal anatomy, cephalic presentation, placenta posterior, 3387g, 75% EFW   Prenatal History/Complications: - cHTN - BMI 58.6 - Hx C-section x1 (for nrFHT while pushing) - HFpEF - GDMA2 (Metformin)  Past Medical History: Past Medical History:  Diagnosis Date   Acid reflux    Blood transfusion without reported diagnosis    Hypertension    Sickle cell trait ([place]     Past Surgical History: Past Surgical History:  Procedure Laterality Date   CESAREAN SECTION N/A 10/08/2013   Procedure: CESAREAN SECTION;  Surgeon: LLahoma Crocker MD;  [facility];  Service: Obstetrics;  Laterality: N/A;    Obstetrical History: OB History     Gravida  3   Para  1   Term  1   Preterm      AB  1   Living  1      SAB      IAB  1   Ectopic      Multiple      Live Births  1           Social History Social History   Socioeconomic History   Marital status: Single     Spouse name: Not on file   Number of children: Not on file   Years of education: Not on file   Highest education level: Not on file  Occupational History   Not on file  Tobacco Use   Smoking status: Never   Smokeless tobacco: Never  Vaping Use   Vaping Use: Never used  Substance and Sexual Activity   Alcohol use: No   Drug use: No   Sexual activity: Not Currently    Partners: Male    Birth control/protection: None  Other Topics Concern   Not on file  Social History Narrative   Completed some college   Works at [place]  Has one son, 7 years.   Enjoys playing with her son, sleeping.   Family is established here as well.   Social Determinants of Health   Financial Resource Strain: Low Risk    Difficulty of Paying Living Expenses: Not hard at all  Food Insecurity: No Food Insecurity   Worried About RCharity fundraiserin the Last Year: Never true   [place] the Last Year: Never true  Transportation Needs: No Transportation Needs   Lack of Transportation (Medical): No   Lack of Transportation (  Non-Medical): No  Physical Activity: Unknown   Days of Exercise per Week: 0 days   Minutes of Exercise per Session: Not on file  Stress: Stress Concern Present   Feeling of Stress : To some extent  Social Connections: Unknown   Frequency of Communication with Friends and Family: More than three times a week   Frequency of Social Gatherings with Friends and Family: More than three times a week   Attends Religious Services: Not on Electrical engineer or Organizations: Not on file   Attends Archivist Meetings: Not on file   Marital Status: Not on file    Family History: Family History  Problem Relation Age of Onset   Diabetes Mother    Arthritis Mother    Hypertension Mother    Diabetes Maternal Grandmother    Hypertension Maternal Grandmother    Cancer Neg Hx    Heart disease Neg Hx     Allergies: Allergies  Allergen  Reactions   Hydralazine Itching   Lasix [Furosemide] Swelling    Mouth and right side of face     Medications Prior to Admission  Medication Sig Dispense Refill Last Dose   aspirin EC 81 MG tablet Take 1 tablet (81 mg total) by mouth daily. Swallow whole. 30 tablet 11 07/06/2021   glycopyrrolate (ROBINUL) 1 MG tablet TAKE 1 TABLET BY MOUTH 3 TIMES DAILY. 90 tablet 2 07/06/2021   loratadine (CLARITIN) 10 MG tablet Take 1 tablet (10 mg total) by mouth daily. 30 tablet 2 Past Week   metFORMIN (GLUCOPHAGE) 500 MG tablet Take 1 tablet (500 mg total) by mouth daily with supper. 60 tablet 5 07/06/2021   pantoprazole (PROTONIX) 40 MG tablet Take 1 tablet (40 mg total) by mouth daily. 30 tablet 2 07/06/2021   Prenat-Fe Poly-Methfol-FA-DHA (VITAFOL ULTRA) 29-0.6-0.4-200 MG CAPS Take 1 capsule by mouth daily before breakfast. 90 capsule 4 07/06/2021   Accu-Chek Softclix Lancets lancets Use 1 lancet to check blood sugar 4 times daily 100 each 5    Blood Glucose Monitoring Suppl (ACCU-CHEK GUIDE) w/Device KIT Use glucose meter to check blood sugar 4 times daily 1 kit 0    glucose blood (ACCU-CHEK GUIDE) test strip Use 1 test strip to check blood sugar 4 times daily 100 each 5      Review of Systems   All systems reviewed and negative except as stated in HPI  Blood pressure 137/69, pulse (!) 106, temperature 97.8 F (36.6 C), temperature source Oral, resp. rate 16, last menstrual period 10/17/2020, SpO2 98 %. General appearance: alert, cooperative, and no distress Lungs: normal work of breathing Heart: regular rate and rhythm Abdomen: gravid Pelvic: cervical exam deferred (pt requests to eat first).  Extremities: Homans sign is negative, no sign of DVT Fetal monitoringBaseline: 130 bpm, Variability: Good {> 6 bpm), Accelerations: no, and Decelerations: Absent Uterine activityFrequency: Every 3-4 minutes and Intensity: unfeeling     Prenatal labs: ABO, Rh: --/--/A POS (10/13 2263) Antibody:  NEG (10/13 0836) Rubella: 2.83 (04/06 1353) RPR: NON REACTIVE (10/13 0836)  HBsAg: Negative (04/06 1353)  HIV: Non Reactive (08/10 1110)  GBS: Negative/-- (10/07 1047)  1 hr Glucola failed Genetic screening: NIPS low risk, AFP neg, Horizon silent alpha thal carrier and sickle cell trait Anatomy US normal  Prenatal Transfer Tool  Maternal Diabetes: Yes:  Diabetes Type:  Diet controlled Genetic Screening: alpha thal carrier and sickle cell trait Maternal Ultrasounds/Referrals: Normal Fetal Ultrasounds or other Referrals:  None  Maternal Substance Abuse:  No Significant Maternal Medications:  Meds include: Protonix Significant Maternal Lab Results: Group B Strep negative  Results for orders placed or performed during the hospital encounter of 07/07/21 (from the past 24 hour(s))  Urinalysis, Routine w reflex microscopic Urine, Clean Catch   Collection Time: 07/07/21  8:26 AM  Result Value Ref Range   Color, Urine YELLOW YELLOW   APPearance HAZY (A) CLEAR   Specific Gravity, Urine 1.008 1.005 - 1.030   pH 7.0 5.0 - 8.0   Glucose, UA NEGATIVE NEGATIVE mg/dL   Hgb urine dipstick NEGATIVE NEGATIVE   Bilirubin Urine NEGATIVE NEGATIVE   Ketones, ur 5 (A) NEGATIVE mg/dL   Protein, ur 30 (A) NEGATIVE mg/dL   Nitrite NEGATIVE NEGATIVE   Leukocytes,Ua NEGATIVE NEGATIVE   RBC / HPF 0-5 0 - 5 RBC/hpf   WBC, UA 0-5 0 - 5 WBC/hpf   Bacteria, UA MANY (A) NONE SEEN   Squamous Epithelial / LPF 11-20 0 - 5  Protein / creatinine ratio, urine   Collection Time: 07/07/21  8:26 AM  Result Value Ref Range   Creatinine, Urine 59.89 mg/dL   Total Protein, Urine 27 mg/dL   Protein Creatinine Ratio 0.45 (H) 0.00 - 0.15 mg/mg[Cre]  CBC   Collection Time: 07/07/21  8:36 AM  Result Value Ref Range   WBC 8.8 4.0 - 10.5 K/uL   RBC 4.24 3.87 - 5.11 MIL/uL   Hemoglobin 8.9 (L) 12.0 - 15.0 g/dL   HCT 30.5 (L) 36.0 - 46.0 %   MCV 71.9 (L) 80.0 - 100.0 fL   MCH 21.0 (L) 26.0 - 34.0 pg   MCHC 29.2 (L) 30.0  - 36.0 g/dL   RDW 17.2 (H) 11.5 - 15.5 %   Platelets 326 150 - 400 K/uL   nRBC 0.2 0.0 - 0.2 %  Comprehensive metabolic panel   Collection Time: 07/07/21  8:36 AM  Result Value Ref Range   Sodium 136 135 - 145 mmol/L   Potassium 4.0 3.5 - 5.1 mmol/L   Chloride 104 98 - 111 mmol/L   CO2 23 22 - 32 mmol/L   Glucose, Bld 87 70 - 99 mg/dL   BUN 5 (L) 6 - 20 mg/dL   Creatinine, Ser 0.51 0.44 - 1.00 mg/dL   Calcium 9.3 8.9 - 10.3 mg/dL   Total Protein 6.4 (L) 6.5 - 8.1 g/dL   Albumin 2.5 (L) 3.5 - 5.0 g/dL   AST 16 15 - 41 U/L   ALT 15 0 - 44 U/L   Alkaline Phosphatase 102 38 - 126 U/L   Total Bilirubin 0.4 0.3 - 1.2 mg/dL   GFR, Estimated >60 >60 mL/min   Anion gap 9 5 - 15  RPR   Collection Time: 07/07/21  8:36 AM  Result Value Ref Range   RPR Ser Ql NON REACTIVE NON REACTIVE  Type and screen [place]   Collection Time: 07/07/21  8:36 AM  Result Value Ref Range   ABO/RH(D) A POS    Antibody Screen NEG    Sample Expiration      07/10/2021,2359 Performed at [facility], [address] [place], [place] [address]   Resp Panel by RT-PCR (Flu A&B, Covid) Nasopharyngeal Swab   Collection Time: 07/07/21 11:29 AM   Specimen: Nasopharyngeal Swab; Nasopharyngeal(NP) swabs in vial transport medium  Result Value Ref Range   SARS Coronavirus 2 by RT PCR NEGATIVE NEGATIVE   Influenza A by PCR NEGATIVE NEGATIVE  Influenza B by PCR NEGATIVE NEGATIVE  Glucose, capillary   Collection Time: 07/07/21 12:49 PM  Result Value Ref Range   Glucose-Capillary 80 70 - 99 mg/dL    Patient Active Problem List   Diagnosis Date Noted   Encounter for supervision of high risk pregnancy in third trimester, antepartum 07/07/2021   Diet controlled gestational diabetes mellitus (GDM) in third trimester 05/18/2021   Red Chart Rounds Patient 05/13/2021   Alpha thalassemia silent carrier 02/23/2021   SOB (shortness of breath) 02/12/2021   Heart failure with preserved ejection  fraction ([place]) 02/12/2021   Morbid obesity ([place]) 02/12/2021   Sickle cell trait ([place])    Chronic hypertension affecting pregnancy    Acid reflux    History of C-section 02/02/2021   Supervision of high risk pregnancy, antepartum 12/15/2020   Morbid obesity with BMI of 50.0-59.9, adult ([place]) 01/14/2016   Essential hypertension 29/93/7169   Acute diastolic CHF (congestive heart failure) ([place]) 10/18/2013   Ptyalism 04/11/2013    Assessment/Plan:  Lennie Muckle Vokes is a 35 y.o. G3P1011 at 52w4dhere for IOL/TOLAC for cHTN with superimposed pre-eclampsia with severe features (severe range BP)  #Labor: Confirmed patient still desires TOLAC. Will perform cervical evaluation and assessment of fetal lie once patient settled in room. Likely will start with foley bulb and pitocin.  #Pain: Desires fentanyl as needed, epidural   #FWB: Cat 1 #ID:  GBS neg #MOF: bottle #MOC:pp IUD #Circ:  yes  #pre-E with SF: BP intermittently in severe range prior to Mg with UPC elevated at 0.45 on admission. Given 614mlabetolol and started on Mg infusion with BP currently stable and intermittently moderately elevated without any further severe range BP. Continue to monitor.  #Hx CHFpEF:  Diagnosed in post partum period during last pregnancy (7 years ago) [facility] Last TTE in 02/2021 with normal EF.  -Per Cardiology recommendation will do IV Lasix post partum and then PO Torsemide (given patient's previous allergic reaction to PO lasix) for total of 7 days - has follow up with OB Cards on 10/28  #GDMA2: On Metformin during pregnancy.  - In labor glucose q4hr BG and then q2hr when in active labor   AnRenard MatterMD, PGY-1, Faculty Service 07/07/2021, 2:05 PM  GME ATTESTATION:  I saw and evaluated the patient. I agree with the findings and the plan of care as documented in the resident's note and have made necessary changes as appropriate.  AnRenard MatterMD, MPH OB Fellow, [facility]0/13/2022 2:05 PM

## 2021-07-08 ENCOUNTER — Inpatient Hospital Stay (HOSPITAL_COMMUNITY): Payer: BC Managed Care – PPO | Admitting: Anesthesiology

## 2021-07-08 LAB — COMPREHENSIVE METABOLIC PANEL
ALT: 14 U/L (ref 0–44)
AST: 40 U/L (ref 15–41)
Albumin: 2.6 g/dL — ABNORMAL LOW (ref 3.5–5.0)
Alkaline Phosphatase: 109 U/L (ref 38–126)
Anion gap: 7 (ref 5–15)
BUN: 5 mg/dL — ABNORMAL LOW (ref 6–20)
CO2: 24 mmol/L (ref 22–32)
Calcium: 7.9 mg/dL — ABNORMAL LOW (ref 8.9–10.3)
Chloride: 101 mmol/L (ref 98–111)
Creatinine, Ser: 0.52 mg/dL (ref 0.44–1.00)
GFR, Estimated: >60 mL/min (ref >60)
Glucose, Bld: 124 mg/dL — ABNORMAL HIGH (ref 70–99)
Potassium: 4.8 mmol/L (ref 3.5–5.1)
Sodium: 132 mmol/L — ABNORMAL LOW (ref 135–145)
Total Bilirubin: 1 mg/dL (ref 0.3–1.2)
Total Protein: 6.6 g/dL (ref 6.5–8.1)

## 2021-07-08 LAB — CBC WITH DIFFERENTIAL/PLATELET
Abs Immature Granulocytes: 0.09 10*3/uL — ABNORMAL HIGH (ref 0.00–0.07)
Basophils Absolute: 0 10*3/uL (ref 0.0–0.1)
Basophils Relative: 0 %
Eosinophils Absolute: 0.1 10*3/uL (ref 0.0–0.5)
Eosinophils Relative: 1 %
HCT: 30 % — ABNORMAL LOW (ref 36.0–46.0)
Hemoglobin: 9.1 g/dL — ABNORMAL LOW (ref 12.0–15.0)
Immature Granulocytes: 1 %
Lymphocytes Relative: 12 %
Lymphs Abs: 1.4 10*3/uL (ref 0.7–4.0)
MCH: 21.5 pg — ABNORMAL LOW (ref 26.0–34.0)
MCHC: 30.3 g/dL (ref 30.0–36.0)
MCV: 70.9 fL — ABNORMAL LOW (ref 80.0–100.0)
Monocytes Absolute: 0.9 10*3/uL (ref 0.1–1.0)
Monocytes Relative: 7 %
Neutro Abs: 9.5 10*3/uL — ABNORMAL HIGH (ref 1.7–7.7)
Neutrophils Relative %: 79 %
Platelets: 338 10*3/uL (ref 150–400)
RBC: 4.23 MIL/uL (ref 3.87–5.11)
RDW: 17.3 % — ABNORMAL HIGH (ref 11.5–15.5)
WBC: 12 10*3/uL — ABNORMAL HIGH (ref 4.0–10.5)
nRBC: 0 % (ref 0.0–0.2)

## 2021-07-08 LAB — MAGNESIUM: Magnesium: 5.2 mg/dL — ABNORMAL HIGH (ref 1.7–2.4)

## 2021-07-08 LAB — GLUCOSE, CAPILLARY
Glucose-Capillary: 142 mg/dL — ABNORMAL HIGH (ref 70–99)
Glucose-Capillary: 113 mg/dL — ABNORMAL HIGH (ref 70–99)
Glucose-Capillary: 85 mg/dL (ref 70–99)
Glucose-Capillary: 106 mg/dL — ABNORMAL HIGH (ref 70–99)
Glucose-Capillary: 116 mg/dL — ABNORMAL HIGH (ref 70–99)

## 2021-07-08 MED ORDER — FENTANYL-BUPIVACAINE-NACL 0.5-0.125-0.9 MG/250ML-% EP SOLN
EPIDURAL | Status: DC | PRN
  Administered 2021-07-08: 10.5 mL/h via EPIDURAL

## 2021-07-08 MED ORDER — FUROSEMIDE 10 MG/ML IJ SOLN
20.0000 mg | Freq: Once | INTRAMUSCULAR | Status: AC
  Administered 2021-07-08: 20 mg via INTRAVENOUS
  Filled 2021-07-08: qty 2

## 2021-07-08 MED ORDER — GLYCOPYRROLATE 0.2 MG/ML IJ SOLN
0.2000 mg | INTRAMUSCULAR | Status: DC | PRN
  Administered 2021-07-08 (×2): 0.2 mg via INTRAVENOUS
  Filled 2021-07-08 (×3): qty 1

## 2021-07-08 MED ORDER — LIDOCAINE HCL (PF) 1 % IJ SOLN
INTRAMUSCULAR | Status: DC | PRN
  Administered 2021-07-08 (×2): 4 mL via EPIDURAL

## 2021-07-08 MED ORDER — FENTANYL-BUPIVACAINE-NACL 0.5-0.125-0.9 MG/250ML-% EP SOLN
EPIDURAL | Status: AC
  Administered 2021-07-09: 12 mL/h via EPIDURAL
  Filled 2021-07-08: qty 250

## 2021-07-08 NOTE — Progress Notes (Signed)
Labor Progress Note Suzanne [name] is a 35 y.o. G3P1011 at [redacted]w[redacted]d presented for IOL/TOLAC for preE with SF  S: Comfortable with epidural. No concerns  O:  BP 131/80   Pulse (!) 103   Temp 98.2 F (36.8 C) (Oral)   Resp 18   Ht 4\' 11"  (1.499 m)   Wt 131.7 kg   LMP 10/17/2020   SpO2 95%   BMI 58.63 kg/m  EFM: 125/good variability/no accels/no decels  CVE: Dilation: 5 Effacement (%): 60 Cervical Position: Posterior Station: -3 Presentation: Vertex Exam by:: Dr [email or web address]   A&P: 35 y.o. 36 [redacted]w[redacted]d IOL for preE with SF #Labor: Progressing well. Foley bulb now out. Will uptitrate pitocin. Recheck in 4 hrs. #PreE with SF: Continue mag. Labetalol as needed. Pressures overall well controlled with intermittent mild to moderate elevations #A2GDMA: glucose WNL. Continue q4 glucose checks #Pain: epidural in place #FWB: cat 1 #GBS negative  [redacted]w[redacted]d, MD, PGY-1, Faculty Service 5:18 PM

## 2021-07-08 NOTE — Anesthesia Preprocedure Evaluation (Addendum)
Anesthesia Evaluation  Patient identified by MRN, date of birth, ID band Patient awake    Reviewed: Allergy & Precautions, NPO status , Patient's Chart, lab work & pertinent test results, reviewed documented beta blocker date and time   Airway Mallampati: III  TM Distance: >3 FB Neck ROM: Full    Dental no notable dental hx. (+) Teeth Intact, Dental Advisory Given   Pulmonary neg pulmonary ROS,    Pulmonary exam normal breath sounds clear to auscultation       Cardiovascular hypertension, Pt. on medications +CHF  Normal cardiovascular exam Rhythm:Regular Rate:Normal  EKG 02/11/21 Sinus tachycardia, poor R wave progression  Echo 02/25/21 1. Left ventricular ejection fraction, by estimation, is 60 to 65%. The left ventricle has normal function. The left ventricle has no regional wall motion abnormalities. Left ventricular diastolic parameters were normal. The average left ventricular global longitudinal strain is -23.7 %. The global longitudinal strain is normal.  2. Right ventricular systolic function is normal. The right ventricular  size is normal. Tricuspid regurgitation signal is inadequate for assessing PA pressure.  3. The mitral valve is normal in structure. No evidence of mitral valve regurgitation. No evidence of mitral stenosis.  4. The aortic valve is normal in structure. Aortic valve regurgitation is not visualized. No aortic stenosis is present.  5. The inferior vena cava is normal in size with greater than 50% respiratory variability, suggesting right atrial pressure of 3 mmHg.    Neuro/Psych negative neurological ROS  negative psych ROS   GI/Hepatic Neg liver ROS, GERD  Medicated and Controlled,  Endo/Other  diabetes, Well Controlled, GestationalMorbid obesity  Renal/GU negative Renal ROS  negative genitourinary   Musculoskeletal   Abdominal (+) + obese,   Peds  Hematology  (+) Sickle cell trait and  anemia , Hx/o blood transfusion in the past   Anesthesia Other Findings   Reproductive/Obstetrics (+) Pregnancy Hx/o previous C/Section Pre Eclampsia with severe features on MgSO4                           Anesthesia Physical Anesthesia Plan  ASA: 3 and emergent  Anesthesia Plan: Epidural   Post-op Pain Management:    Induction:   PONV Risk Score and Plan: 4 or greater and Treatment may vary due to age or medical condition and Scopolamine patch - Pre-op  Airway Management Planned: Natural Airway  Additional Equipment:   Intra-op Plan:   Post-operative Plan:   Informed Consent: I have reviewed the patients History and Physical, chart, labs and discussed the procedure including the risks, benefits and alternatives for the proposed anesthesia with the patient or authorized representative who has indicated his/her understanding and acceptance.     Dental advisory given  Plan Discussed with: Anesthesiologist and CRNA  Anesthesia Plan Comments: (Patient for C/Section for arrest of dilation/descent. Will use epidural for C/Section. M. Malen Gauze, MD)      Anesthesia Quick Evaluation

## 2021-07-08 NOTE — Anesthesia Procedure Notes (Signed)
Epidural Patient location during procedure: OB Start time: 07/08/2021 10:05 AM End time: 07/08/2021 10:16 AM  Staffing Anesthesiologist: Mal Amabile, MD Performed: anesthesiologist   Preanesthetic Checklist Completed: patient identified, IV checked, site marked, risks and benefits discussed, surgical consent, monitors and equipment checked, pre-op evaluation and timeout performed  Epidural Patient position: sitting Prep: DuraPrep and site prepped and draped Patient monitoring: continuous pulse ox and blood pressure Approach: midline Location: L3-L4 Injection technique: LOR air  Needle:  Needle type: Tuohy  Needle gauge: 17 G Needle length: 9 cm and 9 Needle insertion depth: 5 cm and 11 cm Catheter type: closed end flexible Catheter size: 19 Gauge Catheter at skin depth: 10 and 17 cm Test dose: negative and Other  Assessment Events: blood not aspirated, injection not painful, no injection resistance, no paresthesia and negative IV test  Additional Notes Patient identified. Risks and benefits discussed including failed block, incomplete  Pain control, post dural puncture headache, nerve damage, paralysis, blood pressure Changes, nausea, vomiting, reactions to medications-both toxic and allergic and post Partum back pain. All questions were answered. Patient expressed understanding and wished to proceed. Sterile technique was used throughout procedure. Epidural site was Dressed with sterile barrier dressing. No paresthesias, signs of intravascular injection Or signs of intrathecal spread were encountered.  Patient was more comfortable after the epidural was dosed. Please see RN's note for documentation of vital signs and FHR which are stable. Reason for block:procedure for pain

## 2021-07-08 NOTE — Progress Notes (Addendum)
LABOR PROGRESS NOTE  Suzanne [name] is a 35 y.o. G3P1011 at [redacted]w[redacted]d  presented for IOL/TOLAC for cHTN w/ si preE w/ SF.   Subjective: Comfortable with epidural but feeling nauseous. Requesting nausea medication.  Objective: BP 132/75   Pulse (!) 115   Temp 98.1 F (36.7 C) (Oral)   Resp 18   Ht 4\' 11"  (1.499 m)   Wt 131.7 kg   LMP 10/17/2020   SpO2 95%   BMI 58.63 kg/m  or  Vitals:   07/08/21 1932 07/08/21 1947 07/08/21 2002 07/08/21 2032  BP: 133/74 (!) 142/99 137/76 132/75  Pulse: (!) 109 (!) 121 (!) 119 (!) 115  Resp: 18  18   Temp:  98.3 F (36.8 C) 98.1 F (36.7 C)   TempSrc:  Axillary Oral   SpO2:      Weight:      Height:       Dilation: 5.5 Effacement (%): 70 Cervical Position: Posterior Station: -2 Presentation: Vertex Exam by:: [email or web address], RN FHT: baseline rate 135, moderate variability, -accels, -decels Toco: irregular q3-5 min  Labs: Lab Results  Component Value Date   WBC 12.0 (H) 07/08/2021   HGB 9.1 (L) 07/08/2021   HCT 30.0 (L) 07/08/2021   MCV 70.9 (L) 07/08/2021   PLT 338 07/08/2021    Patient Active Problem List   Diagnosis Date Noted   Encounter for supervision of high risk pregnancy in third trimester, antepartum 07/07/2021   Diet controlled gestational diabetes mellitus (GDM) in third trimester 05/18/2021   Red Chart Rounds Patient 05/13/2021   Alpha thalassemia silent carrier 02/23/2021   SOB (shortness of breath) 02/12/2021   Heart failure with preserved ejection fraction (H[facility]) 02/12/2021   Morbid obesity (H[facility]) 02/12/2021   Sickle cell trait ([facility])    Chronic hypertension affecting pregnancy    Acid reflux    History of C-section 02/02/2021   Supervision of high risk pregnancy, antepartum 12/15/2020   Morbid obesity with BMI of 50.0-59.9, adult (H[facility]) 01/14/2016   Essential hypertension 12/17/2014   Acute diastolic CHF (congestive heart failure) ([facility]) 10/18/2013   Ptyalism 04/11/2013    Assessment / Plan: 35 y.o.  G3P1011 at 33y5d here for IOL/TOLAC for cHTN w/ si preE w/ SF by blood pressure criteria.   Labor: Progressing well. Continue with Pitocin. Zofran onboard for nausea.  Fetal Wellbeing:  Category I Pain Control:  epidural Anticipated MOD:  vaginal #GBS negative cHTN w/ si preE w/ SF: Labetalol prn. BP checks q67min. Well controlled at this time.   35m, DO 07/08/2021, 9:14 PM PGY-1, [facility]

## 2021-07-08 NOTE — Progress Notes (Signed)
Suzanne [name] is a 35 y.o. G3P1011 at [redacted]w[redacted]d admitted for IOL for CHTN with SIPE   Subjective: Pt comfortable with epidural. Family in room for support.   Objective: BP 108/71   Pulse (!) 101   Temp 98.2 F (36.8 C) (Oral)   Resp 18   Ht 4\' 11"  (1.499 m)   Wt 131.7 kg   LMP 10/17/2020   SpO2 95%   BMI 58.63 kg/m  I/O last 3 completed shifts: In: 2960.7 [P.O.:879; I.V.:1961.7; Other:120] Out: 4575 [Urine:4425; Emesis/NG output:150] Total I/O In: -  Out: 1000 [Urine:1000]  FHT:  FHR: 130 bpm, variability: moderate with periods of minimal,  accelerations:  Present,  decelerations:  Absent UC:   irregular, every 2-5 minutes, mild to palpation SVE:   Dilation: 1 Effacement (%): 50 Exam by:: [email or web address], CNM Foley balloon placed without difficulty. Pt tolerated well.  Labs: Lab Results  Component Value Date   WBC 12.0 (H) 07/08/2021   HGB 9.1 (L) 07/08/2021   HCT 30.0 (L) 07/08/2021   MCV 70.9 (L) 07/08/2021   PLT 338 07/08/2021    Assessment / Plan: Induction of labor due to [facility] with SIPE Foley balloon in place  Labor:  Lower Pitocin from 18 mu/min to 6 and stay at 6 while foley bulb in place. Titrate up by 2 milliunits per protocol after foley comes out.    Preeclampsia:  labs stable Fetal Wellbeing:  Category I Pain Control:  Epidural I/D:   Gbs neg Anticipated MOD:  NSVD  [facility] 07/08/2021, 2:26 PM

## 2021-07-08 NOTE — Progress Notes (Signed)
Labor Progress Note Suzanne [name] is a 35 y.o. G3P1011 at [redacted]w[redacted]d presented for TOLAC/IOL for cHTN superimposed with pre-e with severe features (BP).   S: Checked earlier this evening, still essentially closed/thick. Just received some IV fent and will trial FB placement. Starting to feel some contractions.   O:  BP (!) 141/77   Pulse (!) 106   Temp 97.6 F (36.4 C) (Axillary)   Resp 18   Ht 4\' 11"  (1.499 m)   Wt 131.7 kg   LMP 10/17/2020   SpO2 94%   BMI 58.63 kg/m  EFM: 135/min-mod/none/none   CVE: Dilation: Closed Effacement (%): 30 Cervical Position: Posterior Presentation: Vertex Exam by:: [email or web address]   A&P: 35 y.o. G3P1011 [redacted]w[redacted]d  #Labor: Confirmed vertex with BSUS on first cervical check. Attempted to place FB with speculum, however despite several techniques-unable to visualize cervix for placement. Will continue to titrate pit 2x2 and re-try in the next few hours pending check either manual or with different speculum.  #Pain: PRN, IV fent  #FWB: Cat II with minimal variability--just received IV fent and on Mg, will monitor closely  #GBS negative  #cHTN with SIPE + SF: No further severe ranges since this am. Cont labetalol and Mg. No current concerning sx.    #A2GDM: CBGs <120. Cont monitoring.   [redacted]w[redacted]d, DO 1:32 AM

## 2021-07-08 NOTE — Progress Notes (Signed)
Labor Progress Note Suzanne [name] is a 35 y.o. G3P1011 at [redacted]w[redacted]d presented for TOLAC/IOL for cHTN superimposed with pre-e with severe features.   S: Doing well. Feeling contractions stronger and more frequent.   O:  BP 134/84   Pulse 100   Temp 97.6 F (36.4 C) (Oral)   Resp 20   Ht 4\' 11"  (1.499 m)   Wt 131.7 kg   LMP 10/17/2020   SpO2 94%   BMI 58.63 kg/m  EFM: 135/mod/none/none  CVE: Dilation: Closed Effacement (%): 30 Cervical Position: Posterior Presentation: Vertex Exam by:: [email or web address]   A&P: 35 y.o. G3P1011 [redacted]w[redacted]d  #Labor: Cervix still quite posterior and feels closed on internal os, about 0.5-1 cm external. Will cont with pit as is with contractions every 2-3 min currently. Hopeful for FB via speculum on next check (hopeful internal will be open at this point) after one unsuccessful attempt thus far. Patient is in agreement with this.  #Pain: PRN  #FWB: Cat 1 currently, periods of Cat II with minimal variability with Mg and fent, returns appropriately  #GBS negative  #cHTN with SIPE + SF: No further persistent severe ranges since this am. Cont labetalol and Mg. No current concerning sx.    #A2GDM: One recent CBG >120 (142) after snacks, otherwise remainder <120. If persistent, will add SSI vs endotool.   [redacted]w[redacted]d, DO 4:36 AM

## 2021-07-09 LAB — GLUCOSE, CAPILLARY
Glucose-Capillary: 151 mg/dL — ABNORMAL HIGH (ref 70–99)
Glucose-Capillary: 115 mg/dL — ABNORMAL HIGH (ref 70–99)
Glucose-Capillary: 106 mg/dL — ABNORMAL HIGH (ref 70–99)
Glucose-Capillary: 97 mg/dL (ref 70–99)
Glucose-Capillary: 107 mg/dL — ABNORMAL HIGH (ref 70–99)
Glucose-Capillary: 107 mg/dL — ABNORMAL HIGH (ref 70–99)

## 2021-07-09 LAB — CBC WITH DIFFERENTIAL/PLATELET
Abs Immature Granulocytes: 0.14 10*3/uL — ABNORMAL HIGH (ref 0.00–0.07)
Basophils Absolute: 0 10*3/uL (ref 0.0–0.1)
Basophils Relative: 0 %
Eosinophils Absolute: 0.1 10*3/uL (ref 0.0–0.5)
Eosinophils Relative: 1 %
HCT: 31.4 % — ABNORMAL LOW (ref 36.0–46.0)
Hemoglobin: 9.6 g/dL — ABNORMAL LOW (ref 12.0–15.0)
Immature Granulocytes: 1 %
Lymphocytes Relative: 16 %
Lymphs Abs: 1.8 10*3/uL (ref 0.7–4.0)
MCH: 21.5 pg — ABNORMAL LOW (ref 26.0–34.0)
MCHC: 30.6 g/dL (ref 30.0–36.0)
MCV: 70.2 fL — ABNORMAL LOW (ref 80.0–100.0)
Monocytes Absolute: 1.1 10*3/uL — ABNORMAL HIGH (ref 0.1–1.0)
Monocytes Relative: 10 %
Neutro Abs: 8.2 10*3/uL — ABNORMAL HIGH (ref 1.7–7.7)
Neutrophils Relative %: 72 %
Platelets: 361 10*3/uL (ref 150–400)
RBC: 4.47 MIL/uL (ref 3.87–5.11)
RDW: 17.4 % — ABNORMAL HIGH (ref 11.5–15.5)
WBC: 11.3 10*3/uL — ABNORMAL HIGH (ref 4.0–10.5)
nRBC: 0.3 % — ABNORMAL HIGH (ref 0.0–0.2)

## 2021-07-09 LAB — COMPREHENSIVE METABOLIC PANEL
ALT: 15 U/L (ref 0–44)
ALT: 15 U/L (ref 0–44)
AST: 19 U/L (ref 15–41)
AST: 21 U/L (ref 15–41)
Albumin: 2.4 g/dL — ABNORMAL LOW (ref 3.5–5.0)
Albumin: 2.3 g/dL — ABNORMAL LOW (ref 3.5–5.0)
Alkaline Phosphatase: 119 U/L (ref 38–126)
Alkaline Phosphatase: 116 U/L (ref 38–126)
Anion gap: 9 (ref 5–15)
Anion gap: 11 (ref 5–15)
BUN: 6 mg/dL (ref 6–20)
BUN: 7 mg/dL (ref 6–20)
CO2: 25 mmol/L (ref 22–32)
CO2: 23 mmol/L (ref 22–32)
Calcium: 7.9 mg/dL — ABNORMAL LOW (ref 8.9–10.3)
Calcium: 7.7 mg/dL — ABNORMAL LOW (ref 8.9–10.3)
Chloride: 99 mmol/L (ref 98–111)
Chloride: 98 mmol/L (ref 98–111)
Creatinine, Ser: 0.85 mg/dL (ref 0.44–1.00)
Creatinine, Ser: 0.87 mg/dL (ref 0.44–1.00)
GFR, Estimated: >60 mL/min (ref >60)
GFR, Estimated: >60 mL/min (ref >60)
Glucose, Bld: 96 mg/dL (ref 70–99)
Glucose, Bld: 101 mg/dL — ABNORMAL HIGH (ref 70–99)
Potassium: 4.2 mmol/L (ref 3.5–5.1)
Potassium: 3.9 mmol/L (ref 3.5–5.1)
Sodium: 133 mmol/L — ABNORMAL LOW (ref 135–145)
Sodium: 132 mmol/L — ABNORMAL LOW (ref 135–145)
Total Bilirubin: 0.4 mg/dL (ref 0.3–1.2)
Total Bilirubin: 0.6 mg/dL (ref 0.3–1.2)
Total Protein: 6.6 g/dL (ref 6.5–8.1)
Total Protein: 6.1 g/dL — ABNORMAL LOW (ref 6.5–8.1)

## 2021-07-09 LAB — MAGNESIUM: Magnesium: 6.8 mg/dL (ref 1.7–2.4)

## 2021-07-09 MED ORDER — EPHEDRINE 5 MG/ML INJ: 10.0000 mg | INTRAVENOUS | Status: DC | PRN

## 2021-07-09 MED ORDER — FENTANYL-BUPIVACAINE-NACL 0.5-0.125-0.9 MG/250ML-% EP SOLN
12.0000 mL/h | EPIDURAL | Status: DC | PRN
  Administered 2021-07-10: 12 mL/h via EPIDURAL
  Filled 2021-07-09 (×2): qty 250

## 2021-07-09 MED ORDER — DIPHENHYDRAMINE HCL 50 MG/ML IJ SOLN: 12.5000 mg | INTRAMUSCULAR | Status: DC | PRN

## 2021-07-09 MED ORDER — PHENYLEPHRINE 40 MCG/ML (10ML) SYRINGE FOR IV PUSH (FOR BLOOD PRESSURE SUPPORT): 80.0000 ug | PREFILLED_SYRINGE | INTRAVENOUS | Status: DC | PRN

## 2021-07-09 MED ORDER — LACTATED RINGERS IV SOLN: 500.0000 mL | Freq: Once | INTRAVENOUS | Status: DC

## 2021-07-09 MED ORDER — FUROSEMIDE 10 MG/ML IJ SOLN
20.0000 mg | Freq: Once | INTRAMUSCULAR | Status: AC
  Administered 2021-07-09: 20 mg via INTRAVENOUS
  Filled 2021-07-09: qty 2

## 2021-07-09 MED ORDER — LACTATED RINGERS IV SOLN: INTRAVENOUS | Status: DC

## 2021-07-09 NOTE — Progress Notes (Signed)
Suzanne [name] is a 35 y.o. G3P1011 at [redacted]w[redacted]d TOLAC/IOL 2/2 cHTN w/ SIPE w/ SF.  Objective: BP (!) 112/37   Pulse 89   Temp 98 F (36.7 C) (Oral)   Resp 20   Ht 4\' 11"  (1.499 m)   Wt 131.7 kg   LMP 10/17/2020   SpO2 94%   BMI 58.63 kg/m  I/O last 3 completed shifts: In: 3929.3 [P.O.:761; I.V.:3168.3] Out: 6335 [Urine:6185; Emesis/NG output:150] Total I/O In: 1094 [P.O.:370; I.V.:657.6; Other:66.4] Out: 1275 [Urine:1275]  SVE:   Dilation: 7 Effacement (%): 100 Station: -2 Exam by:: dr [email or web address]   Assessment / Plan: At assessment at bedside - found to have late decelerations with last few contractions. Turned patient and cervix checked. 7 cm dilated, effacement 100%, and station descended to -2. After check FSE found to be not reading appropriately (going between FHR 70s and 150s).   FSE replaced which also showed FHR fluctuating between 80s and 150s.  In between FSE replacements external fetal monitor reading FHR in 60s. At that time due to concern for FHR pitocin stopped and terbutaline given. HR recovered to 120s (which was previous baseline).  Pitocin kept off for allowing for fetal recovery.   Georgian Co 07/09/2021, 2:18 PM

## 2021-07-09 NOTE — Progress Notes (Signed)
Gabrianna L Downard is a 35 y.o. G3P1011 at [redacted]w[redacted]d admitted for induction of labor due to [facility].  Subjective: Pt comfortable with epidural. Family in room for support.  Objective: BP (!) 142/78   Pulse (!) 112   Temp 98 F (36.7 C) (Oral)   Resp 20   Ht 4\' 11"  (1.499 m)   Wt 131.7 kg   LMP 10/17/2020   SpO2 97%   BMI 58.63 kg/m  I/O last 3 completed shifts: In: 3929.3 [P.O.:761; I.V.:3168.3] Out: 6335 [Urine:6185; Emesis/NG output:150] Total I/O In: 1675.1 [P.O.:620; I.V.:933.5; Other:121.6] Out: 1563 [Urine:1563]  FHT:  FHR: 145 bpm, variability: moderate with periods of minimal,  accelerations:  Abscent,  decelerations:  Absent UC:   irregular, every 20-30 minutes SVE:   Dilation: 7 Effacement (%): 100 Station: -2 Exam by:: dr [email or web address]  Labs: Lab Results  Component Value Date   WBC 11.3 (H) 07/09/2021   HGB 9.6 (L) 07/09/2021   HCT 31.4 (L) 07/09/2021   MCV 70.2 (L) 07/09/2021   PLT 361 07/09/2021    Assessment / Plan: Induction of labor due to preeclampsia  Labor:  Labor progress stalled but Pitocin off x 3-4 hours due to earlier deceleration and acuity of other patients at the time/providers in OR. It is safe now to resume IOL so Pitocin restarted at 4 milliunits/min and to increase by 2 per protocol.  Plan of care reviewed with Dr 07/11/2021.      Preeclampsia:  labs stable Fetal Wellbeing:  Category II Pain Control:  Epidural I/D:   Anticipated MOD:  NSVD  Shawnie Pons 07/09/2021, 6:47 PM

## 2021-07-09 NOTE — Progress Notes (Signed)
Labor Progress Note Suzanne [name] is a 35 y.o. G3P1011 at [redacted]w[redacted]d presented for IOL due to cHTN with superimposed pre-e with severe features.   S: Doing well. Tired of being pregnant.   O:  BP 130/73   Pulse 97   Temp 97.7 F (36.5 C) (Oral)   Resp 18   Ht 4\' 11"  (1.499 m)   Wt 131.7 kg   LMP 10/17/2020   SpO2 95%   BMI 58.63 kg/m  EFM: 120/mod/none/none  CVE: Dilation: 6 Effacement (%): 70 Cervical Position: Posterior Station: -2 Presentation: Vertex Exam by:: Dr. [email or web address]   A&P: 35 y.o. 34 [redacted]w[redacted]d  #Labor: S/p approximate 3 hour pit break after being on pit for ~34 hours. Cervix largely unchanged since last check, loose bag of water present at check however head not well applied to cervix. Re-scanned presentation with BSUS, vertex. Plan to restart pit at 4, as 2x2. Hopeful to AROM on next check with a contraction.  #Pain: Epidural in place   #FWB: Cat 1  #GBS negative  #cHTN with SIPE + SF: BP largely normalized since epidural placement. Cont labetalol PRN and Mg. No current concerning sx. Recheck labs later this morning, q24.    #A2GDM: CBGs have been appropriate (<120), with exception of recent 151 shortly after drinking ginger ale. If persistent, considering endotool vs SSI.   #HFpEF: Received IV lasix x1 with appropriate diuresis earlier. Plan to redose additional at delivery.     [redacted]w[redacted]d, DO 5:02 AM

## 2021-07-09 NOTE — Progress Notes (Signed)
Labor Progress Note Suzanne [name] is a 35 y.o. G3P1011 at [redacted]w[redacted]d presented for IOL 2/2 cHTN w/ SIPE w/ SF.   S: She is doing ok, she would like to further progress her labor.   O:  BP 130/79   Pulse 92   Temp 97.7 F (36.5 C) (Oral)   Resp (!) 22   Ht 4\' 11"  (1.499 m)   Wt 131.7 kg   LMP 10/17/2020   SpO2 97%   BMI 58.63 kg/m  EFM: 110bpm/15x15/no decels, every 4 mins  CVE: Dilation: 6 Effacement (%): 70 Cervical Position: Posterior Station: Ballotable, -3 Presentation: Vertex Exam by:: dr [email or web address]   A&P: 35 y.o. 34 [redacted]w[redacted]d presented for IOL 2/2 cHTN w/ SIPE w/ SF.  #Labor: s/p 3 hour pit break. IOL since 10/13.  Cervical exam unchanged. Noted cephalic presentation on previous BSUS. Present part is not palpable at this time. Discuss AROM and placement of IUPC & FSE w/ patient. She agrees with plan. Dr. 11/13 for rupture due to station of fetus. AROM w/ clear fluid and internal monitors successfully placed #Pain: Epidural #FWB: Cat I #GBS negative  cHTN w/ SIPE w/ SF BP most recently 130/79 -Continue labetalol PRN -Continue Mg gtt -AM labs hemolyzed repeat mag and creatinine, CBC wnl  A2GDM CBG most recently 115 -Continue to monitor CBG q4h  HFpEF LE edema worsening per patient  -KVO fluids -Redose IV lasix now  -Monitor UOP  Suzanne Lakey Autry-Lott, DO 9:47 AM

## 2021-07-09 NOTE — Progress Notes (Signed)
Called by RN due to patient reporting bothersome leg swelling (R>L, this is her usual pattern) and sense of leg heaviness since having her epidural. She does feel like her leg swelling has gotten worse in the past several hours, "can feel the fluid on them". No current SOB or CP.   Blood pressure 131/70, pulse (!) 107, temperature 97.7 F (36.5 C), resp. rate 19, height 4\' 11"  (1.499 m), weight 131.7 kg, last menstrual period 10/17/2020, SpO2 95 %.   Gen: Sitting up comfortable, NAD Card: regular pulse  Pulm: Normal WOB, speaking in full sentences without difficulty  Extremities: 2+ pitting edema b/l to upper shin, calves visually equal in size, non-tender to palpation   A/P:  Worsening leg edema, in the setting of known HFpEF: Will give one dose Lasix IV 20mg  and assess response. Elevate and move legs when possible (as able with epidural). Low concern for DVT at this time. Heaviness is likely in setting of her epidural, she has noted more prominent effects on her R. Monitor closely.   10/19/2020, DO

## 2021-07-09 NOTE — Progress Notes (Addendum)
LABOR PROGRESS NOTE  Suzanne [name] is a 35 y.o. G3P1011 at [redacted]w[redacted]d  presented for IOL/TOLAC for cHTN w/ si preE w/ SF.   Subjective: Comfortable with epidural. Feels that epidural is more prominently affecting her right side.   Objective: BP 124/66   Pulse 99   Temp 97.7 F (36.5 C)   Resp 17   Ht 4\' 11"  (1.499 m)   Wt 131.7 kg   LMP 10/17/2020   SpO2 95%   BMI 58.63 kg/m  Vitals:   07/08/21 2232 07/08/21 2302 07/09/21 0002 07/09/21 0032  BP: 135/72 124/76 127/69 124/66  Pulse: (!) 101 (!) 102 (!) 102 99  Resp:  18 17   Temp:   97.7 F (36.5 C)   TempSrc:      SpO2:      Weight:      Height:       Dilation: 6 Effacement (%): 70 Cervical Position: Posterior Station: -2 Presentation: Vertex Exam by:: K.Louis-Charles, RN FHT: baseline rate 130, moderate variability, -accels, late decels Toco: q3-4 min Chest: no increased work of breathing Extremities: 2+ pitting edema  Labs: Lab Results  Component Value Date   WBC 12.0 (H) 07/08/2021   HGB 9.1 (L) 07/08/2021   HCT 30.0 (L) 07/08/2021   MCV 70.9 (L) 07/08/2021   PLT 338 07/08/2021    Patient Active Problem List   Diagnosis Date Noted   Encounter for supervision of high risk pregnancy in third trimester, antepartum 07/07/2021   Diet controlled gestational diabetes mellitus (GDM) in third trimester 05/18/2021   Red Chart Rounds Patient 05/13/2021   Alpha thalassemia silent carrier 02/23/2021   SOB (shortness of breath) 02/12/2021   Heart failure with preserved ejection fraction (HCC) 02/12/2021   Morbid obesity (HCC) 02/12/2021   Sickle cell trait (HCC)    Chronic hypertension affecting pregnancy    Acid reflux    History of C-section 02/02/2021   Supervision of high risk pregnancy, antepartum 12/15/2020   Morbid obesity with BMI of 50.0-59.9, adult (HCC) 01/14/2016   Essential hypertension 12/17/2014   Acute diastolic CHF (congestive heart failure) (HCC) 10/18/2013   Ptyalism 04/11/2013    Assessment  / Plan: 35 y.o. G3P1011 at [redacted]w[redacted]d here for IOL/TOLAC for cHTN w/ si preE w/ SF.   Labor: slowly progressing to favorable cervix. Pitocin break x 2hrs.  Fetal Wellbeing:  Category II Pain Control:  Epidural Anticipated MOD:  Vaginal #GBS negative cHTN w/ si preE w/ SF: Labetalol prn. Continue BP checks q40min. Well controlled at this time. Given 20mg  Lasix IV @2333  for pitting edema. Continue to monitor clinically.  31m, DO 07/09/2021, 1:41 AM PGY-1, [facility]

## 2021-07-10 ENCOUNTER — Encounter (HOSPITAL_COMMUNITY): Payer: Self-pay | Admitting: Obstetrics and Gynecology

## 2021-07-10 ENCOUNTER — Encounter (HOSPITAL_COMMUNITY)
Admission: AD | Disposition: A | Discharge status: Home / Self Care | Payer: Self-pay | Source: Home / Self Care | Attending: Family Medicine

## 2021-07-10 DIAGNOSIS — Z3A38 38 weeks gestation of pregnancy: Secondary | ICD-10-CM

## 2021-07-10 DIAGNOSIS — O1002 Pre-existing essential hypertension complicating childbirth: Secondary | ICD-10-CM

## 2021-07-10 DIAGNOSIS — O34211 Maternal care for low transverse scar from previous cesarean delivery: Secondary | ICD-10-CM

## 2021-07-10 DIAGNOSIS — O1414 Severe pre-eclampsia complicating childbirth: Secondary | ICD-10-CM

## 2021-07-10 DIAGNOSIS — O24424 Gestational diabetes mellitus in childbirth, insulin controlled: Secondary | ICD-10-CM

## 2021-07-10 LAB — CBC WITH DIFFERENTIAL/PLATELET
Abs Immature Granulocytes: 0 10*3/uL (ref 0.00–0.07)
Basophils Absolute: 0 10*3/uL (ref 0.0–0.1)
Basophils Relative: 0 %
Eosinophils Absolute: 0 10*3/uL (ref 0.0–0.5)
Eosinophils Relative: 0 %
HCT: 27 % — ABNORMAL LOW (ref 36.0–46.0)
Hemoglobin: 8.3 g/dL — ABNORMAL LOW (ref 12.0–15.0)
Lymphocytes Relative: 4 %
Lymphs Abs: 0.8 10*3/uL (ref 0.7–4.0)
MCH: 21.6 pg — ABNORMAL LOW (ref 26.0–34.0)
MCHC: 30.7 g/dL (ref 30.0–36.0)
MCV: 70.3 fL — ABNORMAL LOW (ref 80.0–100.0)
Monocytes Absolute: 0.8 10*3/uL (ref 0.1–1.0)
Monocytes Relative: 4 %
Neutro Abs: 19.5 10*3/uL — ABNORMAL HIGH (ref 1.7–7.7)
Neutrophils Relative %: 92 %
Platelets: 369 10*3/uL (ref 150–400)
RBC: 3.84 MIL/uL — ABNORMAL LOW (ref 3.87–5.11)
RDW: 17.3 % — ABNORMAL HIGH (ref 11.5–15.5)
WBC: 21.2 10*3/uL — ABNORMAL HIGH (ref 4.0–10.5)
nRBC: 0 /100 WBC
nRBC: 0.1 % (ref 0.0–0.2)

## 2021-07-10 LAB — COMPREHENSIVE METABOLIC PANEL
ALT: 14 U/L (ref 0–44)
AST: 24 U/L (ref 15–41)
Albumin: 1.9 g/dL — ABNORMAL LOW (ref 3.5–5.0)
Alkaline Phosphatase: 99 U/L (ref 38–126)
Anion gap: 10 (ref 5–15)
BUN: 13 mg/dL (ref 6–20)
CO2: 22 mmol/L (ref 22–32)
Calcium: 7.6 mg/dL — ABNORMAL LOW (ref 8.9–10.3)
Chloride: 98 mmol/L (ref 98–111)
Creatinine, Ser: 1.52 mg/dL — ABNORMAL HIGH (ref 0.44–1.00)
GFR, Estimated: 46 mL/min — ABNORMAL LOW (ref >60)
Glucose, Bld: 116 mg/dL — ABNORMAL HIGH (ref 70–99)
Potassium: 4.4 mmol/L (ref 3.5–5.1)
Sodium: 130 mmol/L — ABNORMAL LOW (ref 135–145)
Total Bilirubin: 0.6 mg/dL (ref 0.3–1.2)
Total Protein: 5.6 g/dL — ABNORMAL LOW (ref 6.5–8.1)

## 2021-07-10 LAB — CBC
HCT: 28.5 % — ABNORMAL LOW (ref 36.0–46.0)
Hemoglobin: 8.6 g/dL — ABNORMAL LOW (ref 12.0–15.0)
MCH: 21.4 pg — ABNORMAL LOW (ref 26.0–34.0)
MCHC: 30.2 g/dL (ref 30.0–36.0)
MCV: 70.9 fL — ABNORMAL LOW (ref 80.0–100.0)
Platelets: 358 10*3/uL (ref 150–400)
RBC: 4.02 MIL/uL (ref 3.87–5.11)
RDW: 17.2 % — ABNORMAL HIGH (ref 11.5–15.5)
WBC: 18.9 10*3/uL — ABNORMAL HIGH (ref 4.0–10.5)
nRBC: 0.1 % (ref 0.0–0.2)

## 2021-07-10 LAB — GLUCOSE, CAPILLARY
Glucose-Capillary: 97 mg/dL (ref 70–99)
Glucose-Capillary: 92 mg/dL (ref 70–99)
Glucose-Capillary: 108 mg/dL — ABNORMAL HIGH (ref 70–99)

## 2021-07-10 LAB — MAGNESIUM: Magnesium: 8.3 mg/dL (ref 1.7–2.4)

## 2021-07-10 SURGERY — Surgical Case
Anesthesia: Epidural

## 2021-07-10 MED ORDER — DIBUCAINE (PERIANAL) 1 % EX OINT: 1.0000 "application " | TOPICAL_OINTMENT | CUTANEOUS | Status: DC | PRN

## 2021-07-10 MED ORDER — ACETAMINOPHEN 325 MG PO TABS: 650.0000 mg | ORAL_TABLET | ORAL | Status: DC | PRN

## 2021-07-10 MED ORDER — NALBUPHINE HCL 10 MG/ML IJ SOLN: 5.0000 mg | Freq: Once | INTRAMUSCULAR | Status: DC | PRN

## 2021-07-10 MED ORDER — FENTANYL CITRATE (PF) 100 MCG/2ML IJ SOLN
INTRAMUSCULAR | Status: AC
  Filled 2021-07-10: qty 2

## 2021-07-10 MED ORDER — FENTANYL CITRATE (PF) 100 MCG/2ML IJ SOLN
INTRAMUSCULAR | Status: DC | PRN
  Administered 2021-07-10: 100 ug via INTRAVENOUS

## 2021-07-10 MED ORDER — OXYTOCIN-SODIUM CHLORIDE 30-0.9 UT/500ML-% IV SOLN
INTRAVENOUS | Status: DC | PRN
  Administered 2021-07-10: 30 [IU] via INTRAVENOUS

## 2021-07-10 MED ORDER — TRANEXAMIC ACID-NACL 1000-0.7 MG/100ML-% IV SOLN
INTRAVENOUS | Status: DC | PRN
  Administered 2021-07-10: 1000 mg via INTRAVENOUS

## 2021-07-10 MED ORDER — WITCH HAZEL-GLYCERIN EX PADS: 1.0000 "application " | MEDICATED_PAD | CUTANEOUS | Status: DC | PRN

## 2021-07-10 MED ORDER — PRENATAL MULTIVITAMIN CH
1.0000 | ORAL_TABLET | Freq: Every day | ORAL | Status: DC
  Administered 2021-07-10 – 2021-07-13 (×4): 1 via ORAL
  Filled 2021-07-10 (×4): qty 1

## 2021-07-10 MED ORDER — PHENYLEPHRINE HCL-NACL 20-0.9 MG/250ML-% IV SOLN
INTRAVENOUS | Status: DC | PRN
  Administered 2021-07-10: 100 ug/min via INTRAVENOUS

## 2021-07-10 MED ORDER — TETANUS-DIPHTH-ACELL PERTUSSIS 5-2.5-18.5 LF-MCG/0.5 IM SUSY
0.5000 mL | PREFILLED_SYRINGE | Freq: Once | INTRAMUSCULAR | Status: AC
  Administered 2021-07-11: 0.5 mL via INTRAMUSCULAR
  Filled 2021-07-10: qty 0.5

## 2021-07-10 MED ORDER — BUPIVACAINE HCL (PF) 0.25 % IJ SOLN: INTRAMUSCULAR | Status: DC | PRN

## 2021-07-10 MED ORDER — LIDOCAINE-EPINEPHRINE (PF) 2 %-1:200000 IJ SOLN
INTRAMUSCULAR | Status: DC | PRN
  Administered 2021-07-10: 4 mL via INTRADERMAL

## 2021-07-10 MED ORDER — ONDANSETRON HCL 4 MG PO TABS: 4.0000 mg | ORAL_TABLET | ORAL | Status: DC | PRN

## 2021-07-10 MED ORDER — SODIUM BICARBONATE 8.4 % IV SOLN
INTRAVENOUS | Status: DC | PRN
  Administered 2021-07-10: 5 mL via EPIDURAL
  Administered 2021-07-10 (×2): 4 mL via EPIDURAL
  Administered 2021-07-10: 5 mL via EPIDURAL

## 2021-07-10 MED ORDER — LACTATED RINGERS IV SOLN: INTRAVENOUS | Status: DC | PRN

## 2021-07-10 MED ORDER — SCOPOLAMINE 1 MG/3DAYS TD PT72
MEDICATED_PATCH | TRANSDERMAL | Status: DC | PRN
  Administered 2021-07-10: 1 via TRANSDERMAL

## 2021-07-10 MED ORDER — DEXAMETHASONE SODIUM PHOSPHATE 10 MG/ML IJ SOLN
INTRAMUSCULAR | Status: DC | PRN
  Administered 2021-07-10: 10 mg via INTRAVENOUS

## 2021-07-10 MED ORDER — MORPHINE SULFATE (PF) 0.5 MG/ML IJ SOLN
INTRAMUSCULAR | Status: DC | PRN
  Administered 2021-07-10: 3 mg via EPIDURAL

## 2021-07-10 MED ORDER — ORAL CARE MOUTH RINSE
15.0000 mL | Freq: Two times a day (BID) | OROMUCOSAL | Status: DC
  Administered 2021-07-10 – 2021-07-12 (×3): 15 mL via OROMUCOSAL

## 2021-07-10 MED ORDER — ONDANSETRON HCL 4 MG/2ML IJ SOLN
INTRAMUSCULAR | Status: DC | PRN
  Administered 2021-07-10: 4 mg via INTRAVENOUS

## 2021-07-10 MED ORDER — MORPHINE SULFATE (PF) 0.5 MG/ML IJ SOLN
INTRAMUSCULAR | Status: AC
  Filled 2021-07-10: qty 10

## 2021-07-10 MED ORDER — IBUPROFEN 600 MG PO TABS
600.0000 mg | ORAL_TABLET | Freq: Four times a day (QID) | ORAL | Status: DC
  Administered 2021-07-10 – 2021-07-13 (×12): 600 mg via ORAL
  Filled 2021-07-10 (×13): qty 1

## 2021-07-10 MED ORDER — DEXMEDETOMIDINE (PRECEDEX) IN NS 20 MCG/5ML (4 MCG/ML) IV SYRINGE
PREFILLED_SYRINGE | INTRAVENOUS | Status: DC | PRN
  Administered 2021-07-10: 8 ug via INTRAVENOUS
  Administered 2021-07-10: 4 ug via INTRAVENOUS
  Administered 2021-07-10: 8 ug via INTRAVENOUS

## 2021-07-10 MED ORDER — SODIUM CHLORIDE 0.9 % IV SOLN: 500.0000 mg | INTRAVENOUS | Status: DC

## 2021-07-10 MED ORDER — EPHEDRINE SULFATE 50 MG/ML IJ SOLN
INTRAMUSCULAR | Status: DC | PRN
  Administered 2021-07-10 (×2): 5 mg via INTRAVENOUS

## 2021-07-10 MED ORDER — NALBUPHINE HCL 10 MG/ML IJ SOLN: 5.0000 mg | INTRAMUSCULAR | Status: DC | PRN

## 2021-07-10 MED ORDER — SODIUM CHLORIDE 0.9% FLUSH: 3.0000 mL | INTRAVENOUS | Status: DC | PRN

## 2021-07-10 MED ORDER — DIPHENHYDRAMINE HCL 50 MG/ML IJ SOLN: 12.5000 mg | INTRAMUSCULAR | Status: DC | PRN

## 2021-07-10 MED ORDER — ONDANSETRON HCL 4 MG/2ML IJ SOLN
4.0000 mg | Freq: Three times a day (TID) | INTRAMUSCULAR | Status: DC | PRN
  Filled 2021-07-10: qty 2

## 2021-07-10 MED ORDER — SENNOSIDES-DOCUSATE SODIUM 8.6-50 MG PO TABS
2.0000 | ORAL_TABLET | ORAL | Status: DC
  Administered 2021-07-10 – 2021-07-13 (×4): 2 via ORAL
  Filled 2021-07-10 (×4): qty 2

## 2021-07-10 MED ORDER — FUROSEMIDE 10 MG/ML IJ SOLN
40.0000 mg | Freq: Once | INTRAMUSCULAR | Status: AC
  Administered 2021-07-10: 40 mg via INTRAVENOUS
  Filled 2021-07-10: qty 4

## 2021-07-10 MED ORDER — ZOLPIDEM TARTRATE 5 MG PO TABS: 5.0000 mg | ORAL_TABLET | Freq: Every evening | ORAL | Status: DC | PRN

## 2021-07-10 MED ORDER — KETOROLAC TROMETHAMINE 30 MG/ML IJ SOLN
30.0000 mg | Freq: Four times a day (QID) | INTRAMUSCULAR | Status: AC | PRN
Start: 2021-07-10 — End: 2021-07-11
  Administered 2021-07-10: 30 mg via INTRAMUSCULAR

## 2021-07-10 MED ORDER — FUROSEMIDE 10 MG/ML IJ SOLN
20.0000 mg | Freq: Once | INTRAMUSCULAR | Status: AC
  Administered 2021-07-10: 20 mg via INTRAVENOUS
  Filled 2021-07-10: qty 2

## 2021-07-10 MED ORDER — COCONUT OIL OIL: 1.0000 "application " | TOPICAL_OIL | Status: DC | PRN

## 2021-07-10 MED ORDER — KETOROLAC TROMETHAMINE 30 MG/ML IJ SOLN
INTRAMUSCULAR | Status: AC
  Filled 2021-07-10: qty 1

## 2021-07-10 MED ORDER — LACTATED RINGERS IV SOLN: INTRAVENOUS | Status: DC

## 2021-07-10 MED ORDER — FENTANYL CITRATE (PF) 100 MCG/2ML IJ SOLN
25.0000 ug | INTRAMUSCULAR | Status: DC | PRN
  Administered 2021-07-10 (×2): 50 ug via INTRAVENOUS

## 2021-07-10 MED ORDER — DIPHENHYDRAMINE HCL 25 MG PO CAPS
25.0000 mg | ORAL_CAPSULE | ORAL | Status: DC | PRN
  Filled 2021-07-10: qty 1

## 2021-07-10 MED ORDER — SOD CITRATE-CITRIC ACID 500-334 MG/5ML PO SOLN
30.0000 mL | ORAL | Status: AC
  Administered 2021-07-10: 30 mL via ORAL

## 2021-07-10 MED ORDER — NALOXONE HCL 0.4 MG/ML IJ SOLN: 0.4000 mg | INTRAMUSCULAR | Status: DC | PRN

## 2021-07-10 MED ORDER — BENZOCAINE-MENTHOL 20-0.5 % EX AERO: 1.0000 "application " | INHALATION_SPRAY | CUTANEOUS | Status: DC | PRN

## 2021-07-10 MED ORDER — MEDROXYPROGESTERONE ACETATE 150 MG/ML IM SUSP
150.0000 mg | INTRAMUSCULAR | Status: DC | PRN
Start: 2021-07-10 — End: 2021-07-13

## 2021-07-10 MED ORDER — FUROSEMIDE 10 MG/ML IJ SOLN
INTRAMUSCULAR | Status: DC | PRN
  Administered 2021-07-10: 40 mg via INTRAMUSCULAR

## 2021-07-10 MED ORDER — SODIUM CHLORIDE 0.9 % IV SOLN: INTRAVENOUS | Status: DC | PRN

## 2021-07-10 MED ORDER — ONDANSETRON HCL 4 MG/2ML IJ SOLN
4.0000 mg | INTRAMUSCULAR | Status: DC | PRN
  Administered 2021-07-10: 4 mg via INTRAVENOUS

## 2021-07-10 MED ORDER — SIMETHICONE 80 MG PO CHEW: 80.0000 mg | CHEWABLE_TABLET | ORAL | Status: DC | PRN

## 2021-07-10 MED ORDER — SODIUM CHLORIDE 0.9 % IV SOLN
INTRAVENOUS | Status: AC
  Filled 2021-07-10: qty 500

## 2021-07-10 MED ORDER — NALOXONE HCL 4 MG/10ML IJ SOLN
1.0000 ug/kg/h | INTRAVENOUS | Status: DC | PRN
  Filled 2021-07-10: qty 5

## 2021-07-10 MED ORDER — MAGNESIUM SULFATE BOLUS VIA INFUSION
1.0000 g | Freq: Once | INTRAVENOUS | Status: DC
  Filled 2021-07-10: qty 1000

## 2021-07-10 MED ORDER — MEPERIDINE HCL 25 MG/ML IJ SOLN: 6.2500 mg | INTRAMUSCULAR | Status: DC | PRN

## 2021-07-10 MED ORDER — MAGNESIUM SULFATE 40 GM/1000ML IV SOLN
INTRAVENOUS | Status: AC
  Filled 2021-07-10: qty 1000

## 2021-07-10 MED ORDER — SODIUM CHLORIDE 0.9 % IR SOLN
Status: DC | PRN
  Administered 2021-07-10: 1

## 2021-07-10 MED ORDER — DEXTROSE 5 % IV SOLN
INTRAVENOUS | Status: DC | PRN
  Administered 2021-07-10: 3 g via INTRAVENOUS

## 2021-07-10 MED ORDER — LEVONORGESTREL 20.1 MCG/DAY IU IUD
INTRAUTERINE_SYSTEM | INTRAUTERINE | Status: AC
  Filled 2021-07-10: qty 1

## 2021-07-10 MED ORDER — SODIUM CHLORIDE 0.9% FLUSH
3.0000 mL | Freq: Two times a day (BID) | INTRAVENOUS | Status: DC
  Administered 2021-07-11 – 2021-07-12 (×4): 3 mL via INTRAVENOUS

## 2021-07-10 MED ORDER — KETOROLAC TROMETHAMINE 30 MG/ML IJ SOLN: 30.0000 mg | Freq: Four times a day (QID) | INTRAMUSCULAR | Status: AC | PRN

## 2021-07-10 MED ORDER — STERILE WATER FOR IRRIGATION IR SOLN
Status: DC | PRN
  Administered 2021-07-10: 1

## 2021-07-10 MED ORDER — SODIUM CHLORIDE 0.9 % IV SOLN: 250.0000 mL | INTRAVENOUS | Status: DC | PRN

## 2021-07-10 MED ORDER — MEASLES, MUMPS & RUBELLA VAC IJ SOLR: 0.5000 mL | Freq: Once | INTRAMUSCULAR | Status: DC

## 2021-07-10 MED ORDER — DIPHENHYDRAMINE HCL 25 MG PO CAPS: 25.0000 mg | ORAL_CAPSULE | Freq: Four times a day (QID) | ORAL | Status: DC | PRN

## 2021-07-10 MED ORDER — CEFAZOLIN IN SODIUM CHLORIDE 3-0.9 GM/100ML-% IV SOLN
3.0000 g | INTRAVENOUS | Status: DC
  Filled 2021-07-10: qty 100

## 2021-07-10 MED ORDER — MAGNESIUM SULFATE 40 GM/1000ML IV SOLN: 1.0000 g/h | INTRAVENOUS | Status: DC

## 2021-07-10 MED ORDER — PHENYLEPHRINE HCL (PRESSORS) 10 MG/ML IV SOLN
INTRAVENOUS | Status: DC | PRN
  Administered 2021-07-10 (×2): 120 ug via INTRAVENOUS
  Administered 2021-07-10: 80 ug via INTRAVENOUS
  Administered 2021-07-10 (×2): 120 ug via INTRAVENOUS
  Administered 2021-07-10: 80 ug via INTRAVENOUS
  Administered 2021-07-10: 120 ug via INTRAVENOUS

## 2021-07-10 SURGICAL SUPPLY — 44 items
ADH SKN CLS APL DERMABOND .7 (GAUZE/BANDAGES/DRESSINGS) ×2
CANISTER WOUND CARE 500ML ATS (WOUND CARE) ×2 IMPLANT
CHLORAPREP W/TINT 26ML (MISCELLANEOUS) ×2 IMPLANT
CLAMP CORD UMBIL (MISCELLANEOUS) IMPLANT
CLOTH BEACON ORANGE TIMEOUT ST (SAFETY) ×2 IMPLANT
DERMABOND ADVANCED (GAUZE/BANDAGES/DRESSINGS) ×2
DERMABOND ADVANCED .7 DNX12 (GAUZE/BANDAGES/DRESSINGS) ×2 IMPLANT
DRESSING PREVENA PLUS CUSTOM (GAUZE/BANDAGES/DRESSINGS) ×1 IMPLANT
DRSG OPSITE POSTOP 4X10 (GAUZE/BANDAGES/DRESSINGS) ×2 IMPLANT
DRSG PREVENA PLUS CUSTOM (GAUZE/BANDAGES/DRESSINGS) ×2
ELECT REM PT RETURN 9FT ADLT (ELECTROSURGICAL) ×2
ELECTRODE REM PT RTRN 9FT ADLT (ELECTROSURGICAL) ×1 IMPLANT
EXTRACTOR VACUUM BELL STYLE (SUCTIONS) IMPLANT
GLOVE BIOGEL PI IND STRL 7.0 (GLOVE) ×1 IMPLANT
GLOVE BIOGEL PI IND STRL 8 (GLOVE) ×1 IMPLANT
GLOVE BIOGEL PI INDICATOR 7.0 (GLOVE) ×1
GLOVE BIOGEL PI INDICATOR 8 (GLOVE) ×1
GLOVE ECLIPSE 8.0 STRL XLNG CF (GLOVE) ×2 IMPLANT
GOWN STRL REUS W/TWL LRG LVL3 (GOWN DISPOSABLE) ×4 IMPLANT
KIT ABG SYR 3ML LUER SLIP (SYRINGE) ×2 IMPLANT
MAT PREVALON FULL STRYKER (MISCELLANEOUS) ×2 IMPLANT
NEEDLE HYPO 18GX1.5 BLUNT FILL (NEEDLE) ×2 IMPLANT
NEEDLE HYPO 22GX1.5 SAFETY (NEEDLE) ×2 IMPLANT
NEEDLE HYPO 25X5/8 SAFETYGLIDE (NEEDLE) ×2 IMPLANT
NS IRRIG 1000ML POUR BTL (IV SOLUTION) ×2 IMPLANT
PACK C SECTION WH (CUSTOM PROCEDURE TRAY) ×2 IMPLANT
PAD OB MATERNITY 4.3X12.25 (PERSONAL CARE ITEMS) ×2 IMPLANT
PENCIL SMOKE EVAC W/HOLSTER (ELECTROSURGICAL) ×2 IMPLANT
RETRACTOR TRAXI PANNICULUS (MISCELLANEOUS) ×2 IMPLANT
RTRCTR C-SECT PINK 25CM LRG (MISCELLANEOUS) IMPLANT
SUT CHROMIC 0 CT 1 (SUTURE) ×2 IMPLANT
SUT MNCRL 0 VIOLET CTX 36 (SUTURE) ×5 IMPLANT
SUT MONOCRYL 0 CTX 36 (SUTURE) ×10
SUT PLAIN 2 0 (SUTURE)
SUT PLAIN 2 0 XLH (SUTURE) IMPLANT
SUT PLAIN ABS 2-0 CT1 27XMFL (SUTURE) IMPLANT
SUT VIC AB 0 CTX 36 (SUTURE) ×2
SUT VIC AB 0 CTX36XBRD ANBCTRL (SUTURE) ×1 IMPLANT
SUT VIC AB 4-0 KS 27 (SUTURE) IMPLANT
SYR 20CC LL (SYRINGE) ×4 IMPLANT
TOWEL OR 17X24 6PK STRL BLUE (TOWEL DISPOSABLE) ×2 IMPLANT
TRAXI PANNICULUS RETRACTOR (MISCELLANEOUS) ×2
TRAY FOLEY W/BAG SLVR 14FR LF (SET/KITS/TRAYS/PACK) IMPLANT
WATER STERILE IRR 1000ML POUR (IV SOLUTION) ×2 IMPLANT

## 2021-07-10 NOTE — Progress Notes (Signed)
Labor Progress Note Suzanne [name] is a 35 y.o. G3P1011 at [redacted]w[redacted]d presented for IOL/TOLAC for cHTN with SIPE +SF.   S: Tired. Per RN, minimal UOP in the past two or so hours.   O:  BP 121/78   Pulse 98   Temp 98.8 F (37.1 C) (Oral)   Resp 16   Ht 4\' 11"  (1.499 m)   Wt 131.7 kg   LMP 10/17/2020   SpO2 94%   BMI 58.63 kg/m  EFM: 120/periods of absent to minimal var/none/none Ctx every 3-4 min   CVE: Dilation: 9 Effacement (%): 90 Cervical Position: Posterior Station: -1 Presentation: Vertex Exam by:: Molly Savarino, DO   A&P: 35 y.o. G3P1011 [redacted]w[redacted]d  #Labor: Unchanged since last check approximately 2 hours and essentially since 2300. ROM for 18 hours now. Same fetal caput and malpositioning noted despite attempts to change maternal position/peanut etc. Pit now at 10, titrating as possible.  #Pain: Epidural  #Contraception: Still plan for post-placental IUD  #FWB: Cat II, on Mg, watching closely   #HFpEF: hypervolemic on exam, give additional IV lasix.   #A2GDM: CBGs <120.   #cHTN, SIPE + SF: Asymptomatic. BP WNL. Mg just recently re-started at 1g/hr.   Discussed the above with Dr. [id number]. Plan is to continue titrating pit as tolerated, give lasix IV x1, and recheck in 2 hours. If cervix unchanged, plan to proceed with repeat c-section. Patient is aware of plan.   [name], DO 4:18 AM

## 2021-07-10 NOTE — Progress Notes (Addendum)
Provider attempted to place IUD but provider was unable to.

## 2021-07-10 NOTE — Anesthesia Postprocedure Evaluation (Signed)
Anesthesia Post Note  Patient: Suzanne [name]  Procedure(s) Performed: CESAREAN SECTION     Patient location during evaluation: PACU Anesthesia Type: Epidural Level of consciousness: oriented and awake and alert Pain management: pain level controlled Vital Signs Assessment: post-procedure vital signs reviewed and stable Respiratory status: spontaneous breathing, respiratory function stable and nonlabored ventilation Cardiovascular status: blood pressure returned to baseline and stable Postop Assessment: no headache, no backache, no apparent nausea or vomiting, epidural receding and patient able to bend at knees Anesthetic complications: no   No notable events documented.   Pain Goal:                   Finola Rosal A.

## 2021-07-10 NOTE — Progress Notes (Signed)
Labor progress note: IOL for cHTN w/ SIPE +SF, known A2GDM and TOLAC.   Largely unchanged since last check.   Still about 9/90/-1. Fetal Caput present and head unevenly applied to cervix, right side more inferior in pelvis compared to left. Previously noted to be +1 per RN, however additionally had her check again as well and felt station was the same as previous. Pt denies any abdominal or chest pains. Placed into left lateral with peanut to hopefully allow better fetal positioning.   FHT: 130/minimal/none/none. Ctx every 4-5 min.  Cat II strip due to variability--has been on Mg.   BP and CBGs appropriate without any concerning s/sx. Will continue to monitor and increase pit as tolerated.     Allayne Stack, DO

## 2021-07-10 NOTE — Lactation Note (Signed)
This note was copied from a baby's chart. Lactation Consultation Note  Patient Name: Suzanne [name] [id number] Date: 07/10/2021   Age:33 hours  LC checked with RN, Francis Dowse, mom selection formula only   Maternal Data    Feeding    LATCH Score                    Lactation Tools Discussed/Used    Interventions    Discharge    Consult Status      Doug Bucklin  Nicholson-Springer 07/10/2021, 11:23 AM

## 2021-07-10 NOTE — Progress Notes (Signed)
Critical lab value called to me at 1430 by lab. Magnesium level of 8.3. Dr Shawnie Pons notified.

## 2021-07-10 NOTE — Progress Notes (Signed)
Loss of DTR per RN in the past hour. Minimal UOP currently. Has been on Mg since 10/13, most recent level 6.8mg /dl >02 hours ago. Most recent Cr 0.87 from 0.52 on 10/14.    Discussed with Dr. [name]. Will discontinue Mag for one hour and restart at 1 g/hr at that time.   Allayne Stack, DO

## 2021-07-10 NOTE — Progress Notes (Signed)
Patient ID: Suzanne [name], female   DOB: [birth year], 35 y.o.   MRN: [id number] Pt has been at 9 cm for about 6 hours or so inadequate labor but have not been able to get adequate it seems Dr Annia Friendly related asynclitism as well We were going to reassess 1 last time in about an hour but patient is now ready to go forward with a C section On magnesium for pre eclampsia severe features Hx PP CHF, has been given lasix now 3 timesin labor and will continue postpartum  [name], MD 07/10/2021 5:20 AM

## 2021-07-10 NOTE — Progress Notes (Signed)
Patient requested cervix to be rechecked sooner than anticipated. Contractions still inadequate, FHT 120/minimal var/none/none. Cervix unchanged. Discussed with Dr. [name], plan to proceed with C-section.   The risks of cesarean section discussed with the patient included but were not limited to: bleeding which may require transfusion or reoperation; infection which may require antibiotics; injury to bowel, bladder, ureters or other surrounding organs; injury to the fetus; need for additional procedures including hysterectomy in the event of a life-threatening hemorrhage; incisional problems, thromboembolic phenomenon and other postoperative/anesthesia complications. The patient concurred with the proposed plan. Preoperative prophylactic antibiotics and SCDs ordered on call to the OR. To OR when ready.   Plan for pp IUD.   Allayne Stack, DO

## 2021-07-10 NOTE — Discharge Summary (Signed)
Postpartum Discharge Summary  Date of Service updated     Patient Name: Suzanne [name] DOB: 08/14/1986 MRN: [id number]  Date of admission: 07/07/2021 Delivery date:07/10/2021  Delivering provider: Florian Buff  Date of discharge: 07/13/2021  Admitting diagnosis: Encounter for supervision of high risk pregnancy in third trimester, antepartum [O09.93] Intrauterine pregnancy: [redacted]w[redacted]d    Secondary diagnosis:  Active Problems:   Ptyalism   Morbid obesity with BMI of 50.0-59.9, adult ([place]   History of C-section   Sickle cell trait ([place]   Heart failure with preserved ejection fraction ([place]   Red Chart Rounds Patient   Diet controlled gestational diabetes mellitus (GDM) in third trimester   Encounter for supervision of high risk pregnancy in third trimester, antepartum  Additional problems: See below   Discharge diagnosis: Term Pregnancy Delivered, Preeclampsia (severe), CHTN with superimposed preeclampsia, GDM A2, and PPH                                              Post partum procedures: No additional procedures Augmentation: AROM, Pitocin, and IP Foley Complications: [id number] Hospital course: Induction of Labor With Cesarean Section   35y.o. yo [id number] admitted to the hospital 07/07/2021 [facility] in [facility] s/o postpartum HR with preserved EF and A2DM. Patient had a labor course significant for prolonged augmentation course including pit and AROM. The patient went for cesarean section due to  failure to progress/descend at 9cm (asynclitic positioning of fetal head) and ROM >20 hours. Delivery details are as follows: Membrane Rupture Time/Date: 9:56 AM ,07/09/2021   Delivery Method:C-Section, Low Transverse  Details of operation can be found in separate operative Note.    Following her delivery she was started on Magnesium and was on this for 24 hours. She was on IV lasix and then torsemide per Cardiology recommendations  given her h/o postpartum HF and preserved EF.  Once off the Magnesium, her blood pressure was well controlled on Procardia 30 daily. In the s/o her PPBlanchardshe had a starting HgB of 9.6 which dropped to 6.4. She was given 1u PRBC and it rose appropriately to 7.3.  She was started on PO iron following this. Patient otherwise had an uncomplicated postpartum course. She is ambulating, tolerating a regular diet and urinating well.  She was NOT passing gas at the time of discharge but still strongly desired discharge. We discussed the risks of ileus and SBO and after reviewing the risk of possible need to return to MAU, pt desired discharge.   Patient is discharged home in stable condition on 07/13/21.      Newborn Data: Birth date:07/10/2021  Birth time:6:19 AM  Gender:Female  Living status:Living  Apgars:6 ,7  Weight:2945 g                                Magnesium Sulfate received: Yes: Seizure prophylaxis BMZ received: No Rhophylac:No MMR:No T-DaP:Given prenatally Flu: No Transfusion:Yes  Physical exam  Vitals:   07/12/21 1532 07/12/21 1945 07/13/21 0437 07/13/21 0746  BP: (!) 147/82 (!) 140/92 (!) 144/94 139/81  Pulse: (!) 106 (!) 109 (!) 108 (!) 107  Resp: _0 Temp: 98.5 F (36.9 C) 99.1 F (37.3 C) 97.9 F (36.6  C) 98.2 F (36.8 C)  TempSrc: Oral Oral Oral Oral  SpO2: 98% 94%  96%  Weight:      Height:       General: alert, cooperative, and no distress Lochia: appropriate Uterine Fundus: firm Incision: Appears appropriate with Prevena in place DVT Evaluation: No evidence of DVT seen on physical exam. Labs: Lab Results  Component Value Date   WBC 12.9 (H) 07/13/2021   HGB 7.3 (L) 07/13/2021   HCT 23.4 (L) 07/13/2021   MCV 71.8 (L) 07/13/2021   PLT 361 07/13/2021   CMP Latest Ref Rng & Units 07/12/2021  Glucose 70 - 99 mg/dL 83  BUN 6 - 20 mg/dL 20  Creatinine 0.44 - 1.00 mg/dL 0.96  Sodium 135 - 145 mmol/L 139  Potassium 3.5 - 5.1 mmol/L 3.7  Chloride 98 -  111 mmol/L 103  CO2 22 - 32 mmol/L 25  Calcium 8.9 - 10.3 mg/dL 8.8(L)  Total Protein 6.5 - 8.1 g/dL 5.7(L)  Total Bilirubin 0.3 - 1.2 mg/dL 0.3  Alkaline Phos 38 - 126 U/L 73  AST 15 - 41 U/L 19  ALT 0 - 44 U/L 15   Edinburgh Score: Edinburgh Postnatal Depression Scale Screening Tool 07/11/2021  I have been able to laugh and see the funny side of things. 0  I have looked forward with enjoyment to things. 0  I have blamed myself unnecessarily when things went wrong. 0  I have been anxious or worried for no good reason. 0  I have felt scared or panicky for no good reason. 0  Things have been getting on top of me. 0  I have been so unhappy that I have had difficulty sleeping. 0  I have felt sad or miserable. 0  I have been so unhappy that I have been crying. 0  The thought of harming myself has occurred to me. 0  Edinburgh Postnatal Depression Scale Total 0     After visit meds:  Allergies as of 07/13/2021       Reactions   Hydralazine Itching   Lasix [furosemide] Swelling   Mouth and right side of face with oral furosemide. Pt can tolerate IV form        Medication List     STOP taking these medications    Accu-Chek Guide test strip Generic drug: glucose blood   Accu-Chek Guide w/Device Kit   Accu-Chek Softclix Lancets lancets   aspirin EC 81 MG tablet   metFORMIN 500 MG tablet Commonly known as: GLUCOPHAGE       TAKE these medications    acetaminophen 325 MG tablet Commonly known as: Tylenol Take 2 tablets (650 mg total) by mouth every 4 (four) hours as needed for mild pain (for pain scale < 4).   coconut oil Oil Apply 1 application topically as needed.   ferrous sulfate 325 (65 FE) MG tablet Commonly known as: FerrouSul Take 1 tablet (325 mg total) by mouth every other day.   glycopyrrolate 1 MG tablet Commonly known as: ROBINUL Take 2 tablets (2 mg total) by mouth 3 (three) times daily as needed (spitting). What changed:  how much to  take when to take this reasons to take this   ibuprofen 600 MG tablet Commonly known as: ADVIL Take 1 tablet (600 mg total) by mouth every 6 (six) hours.   loratadine 10 MG tablet Commonly known as: CLARITIN Take 1 tablet (10 mg total) by mouth daily.   NIFEdipine 30 MG 24 hr tablet Commonly  known as: ADALAT CC Take 1 tablet (30 mg total) by mouth daily. Start taking on: July 14, 2021   pantoprazole 20 MG tablet Commonly known as: PROTONIX Take 1 tablet (20 mg total) by mouth daily. Start taking on: July 14, 2021 What changed:  medication strength how much to take   torsemide 20 MG tablet Commonly known as: DEMADEX Take 1 tablet (20 mg total) by mouth daily. Continue for 5 more days postpartum and then discontinue Start taking on: July 14, 2021   Vitafol Ultra 29-0.6-0.4-200 MG Caps Take 1 capsule by mouth daily before breakfast.         Discharge home in stable condition Infant Feeding: Bottle Infant Disposition:home with mother Discharge instruction: per After Visit Summary and Postpartum booklet. Activity: Advance as tolerated. Pelvic rest for 6 weeks.  Diet: low salt diet Future Appointments: Future Appointments  Date Time Provider [place]  07/20/2021 11:15 AM Griffin Basil, MD [place] None  07/22/2021  1:00 PM [name], DO [facility] None  08/10/2021  8:35 AM Chancy Milroy, MD [place] None   Follow up Visit:  [place] Follow up.   Specialty: Obstetrics and Gynecology Contact information: [address] [place]        [name], DO .   Specialty: Cardiology Contact information: [address] [place] [address] [phone]                 Message sent to [facility] by Dr Higinio Plan on 07/10/2021:   Please schedule this patient for a In person postpartum visit in 4 weeks with the  following provider: MD. Additional Postpartum F/U:Incision check 1 week and BP check 1 week with removal of Prevena. She will also follow up with cardiology on 10/28 as planned.  High risk pregnancy complicated by: GDM and HTN Delivery mode:  C-Section, Low Transverse  Anticipated Birth Control:  Unsure (considering IUD, ppIUD previously desired but was not able to be placed)    07/13/2021 Radene Gunning, MD

## 2021-07-10 NOTE — Op Note (Signed)
Preoperative diagnosis:  1.  Intrauterine pregnancy at [redacted]w[redacted]d  weeks gestation                                         2.  Chronic hypertension with superimposed preeclampsia with severe features, blood pressure                                         3.  Previous cesarean section                                         4.  Secondary arrest of descent dilatation in the first stage of labor   Postoperative diagnosis:  Same as above plus both inferior and superior extension of low transverse uterine incision  Procedure: Repeat cesarean section  Surgeon:  [name] MD  Assistant: Leticia Penna, DO  Anesthesia: Epidural  Findings:  .    Over a low transverse incision was delivered a viable female with Apgars of 6,7 and 8  weighing pending lbs.  oz. Uterus, tubes and ovaries were all normal.  There were no other significant findings  Description of operation:  Patient was taken to the operating room where she had her epidural dosed for appropriate level for cesarean section.  When adequate anesthetic level was obtained she was prepped and draped in usual sterile fashion and a Foley catheter was placed. A Pfannenstiel skin incision was made and carried down sharply to the rectus fascia which was scored in the midline extended laterally. The fascia was taken off the muscles both superiorly and without difficulty. The muscles were divided.  The peritoneal cavity was entered.  Bladder blade was placed, no bladder flap was created.  A low transverse hysterotomy incision was made and delivered a viable female  infant at [phone] with Apgars of 6/7 and 8 weighing pending lbs  oz.  Cord pH was obtained and was 7.14.   There was an inferior and superior extension of the uterine incision Because the patient was 9 cm for about 5 hours prior to surgery it was not unanticipated that there would be an inferior extension down the lower uterine segment Indeed there was dissecting underneath the bladder without any  bladder involvement However there was also a superior cephalad extension of the uterine incision into the myometrial portion of the uterus and it was significant about two thirds of the way of the uterus and going to the right The uterus defect was closed the inferior portion was closed to the low transverse incision and then the superior portion was closed in 2 layers to the transverse incision An imbricating layer course was placed superiorly No imbricating layer was required inferiorly The original transverse uterine incision was closed without difficulty   There was good resulting hemostasis. The uterus tubes and ovaries were all normal. Peritoneal cavity was irrigated vigorously. The muscles and peritoneum were reapproximated loosely. The fascia was closed using 0 Vicryl in running fashion. Subcutaneous tissue was made hemostatic and irrigated. The skin was closed using 4-0 Vicryl on a Keith needle in a subcuticular fashion.  Dermabond was placed for additional wound integrity and to serve as a barrier. Blood loss for the procedure  was 1018 cc. The patient received Ancef and azithromycin prophylactically. The patient was taken to the recovery room in good stable condition with all counts being correct x3.  Due to her history of a postpartum congestive heart failure with her last baby, she was given Lasix 40 IV Intra-Op and that will be repeated 8 hours postop and then the patient will be placed on oral diuretic  EBL 1018 cc  [name] 07/10/2021 7:12 AM

## 2021-07-10 NOTE — Transfer of Care (Signed)
Immediate Anesthesia Transfer of Care Note  Patient: Suzanne [name]  Procedure(s) Performed: CESAREAN SECTION  Patient Location: PACU  Anesthesia Type:Epidural  Level of Consciousness: awake, alert  and oriented  Airway & Oxygen Therapy: Patient Spontanous Breathing and Patient connected to nasal cannula oxygen  Post-op Assessment: Report given to RN and Post -op Vital signs reviewed and stable  Post vital signs: Reviewed and stable  Last Vitals:  Vitals Value Taken Time  BP 110/59 07/10/21 0800  Temp    Pulse 83 07/10/21 0804  Resp 16 07/10/21 0804  SpO2 92 % 07/10/21 0804  Vitals shown include unvalidated device data.  Last Pain:  Vitals:   07/10/21 0201  TempSrc: Oral  PainSc:          Complications: No notable events documented.

## 2021-07-10 NOTE — Progress Notes (Addendum)
Labor Progress Note Suzanne [name] is a 35 y.o. G3P1011 at [redacted]w[redacted]d presented for IOL/TOLAC for cHTN with SIPE + SF.   Recently checked by RN approximately 1 hour ago and 9 cm/+1 station, despite inadequate measured contractions via IUPC. Increased pit to 8 at that time and placed on her back. Started having subtle late decelerations. Placed on her left lateral side with improvement, but still occasional late present. Decreased back to pit of 6 with resolution. Will maintain here for now and monitor closely.   Recheck cervix:  Dilation: Lip/rim Effacement (%): 90 Cervical Position: Posterior Station: Plus 1 Presentation: Vertex Exam by:: Dahbura, DO  Majority of cervix remaining on the right per Dr. Royal Piedra, placed patient on her right lateral side. Hopeful for vaginal delivery. Will give IV lasix and TXA immediately following delivery.   Allayne Stack, DO 12:01 AM

## 2021-07-11 ENCOUNTER — Encounter (HOSPITAL_COMMUNITY): Payer: Self-pay | Admitting: Obstetrics & Gynecology

## 2021-07-11 LAB — CBC WITH DIFFERENTIAL/PLATELET
Abs Immature Granulocytes: 0.15 10*3/uL — ABNORMAL HIGH (ref 0.00–0.07)
Basophils Absolute: 0 10*3/uL (ref 0.0–0.1)
Basophils Relative: 0 %
Eosinophils Absolute: 0 10*3/uL (ref 0.0–0.5)
Eosinophils Relative: 0 %
HCT: 21.1 % — ABNORMAL LOW (ref 36.0–46.0)
Hemoglobin: 6.4 g/dL — CL (ref 12.0–15.0)
Immature Granulocytes: 1 %
Lymphocytes Relative: 9 %
Lymphs Abs: 1.5 10*3/uL (ref 0.7–4.0)
MCH: 21.3 pg — ABNORMAL LOW (ref 26.0–34.0)
MCHC: 30.3 g/dL (ref 30.0–36.0)
MCV: 70.1 fL — ABNORMAL LOW (ref 80.0–100.0)
Monocytes Absolute: 1.5 10*3/uL — ABNORMAL HIGH (ref 0.1–1.0)
Monocytes Relative: 8 %
Neutro Abs: 14.6 10*3/uL — ABNORMAL HIGH (ref 1.7–7.7)
Neutrophils Relative %: 82 %
Platelets: 323 10*3/uL (ref 150–400)
RBC: 3.01 MIL/uL — ABNORMAL LOW (ref 3.87–5.11)
RDW: 17.1 % — ABNORMAL HIGH (ref 11.5–15.5)
WBC: 17.8 10*3/uL — ABNORMAL HIGH (ref 4.0–10.5)
nRBC: 0 % (ref 0.0–0.2)

## 2021-07-11 LAB — COMPREHENSIVE METABOLIC PANEL
ALT: 12 U/L (ref 0–44)
AST: 20 U/L (ref 15–41)
Albumin: 1.8 g/dL — ABNORMAL LOW (ref 3.5–5.0)
Alkaline Phosphatase: 80 U/L (ref 38–126)
Anion gap: 9 (ref 5–15)
BUN: 20 mg/dL (ref 6–20)
CO2: 23 mmol/L (ref 22–32)
Calcium: 8 mg/dL — ABNORMAL LOW (ref 8.9–10.3)
Chloride: 94 mmol/L — ABNORMAL LOW (ref 98–111)
Creatinine, Ser: 1.16 mg/dL — ABNORMAL HIGH (ref 0.44–1.00)
GFR, Estimated: >60 mL/min (ref >60)
Glucose, Bld: 101 mg/dL — ABNORMAL HIGH (ref 70–99)
Potassium: 4.3 mmol/L (ref 3.5–5.1)
Sodium: 126 mmol/L — ABNORMAL LOW (ref 135–145)
Total Bilirubin: 0.3 mg/dL (ref 0.3–1.2)
Total Protein: 5.3 g/dL — ABNORMAL LOW (ref 6.5–8.1)

## 2021-07-11 LAB — PREPARE RBC (CROSSMATCH)

## 2021-07-11 LAB — GLUCOSE, CAPILLARY: Glucose-Capillary: 100 mg/dL — ABNORMAL HIGH (ref 70–99)

## 2021-07-11 MED ORDER — NIFEDIPINE ER OSMOTIC RELEASE 30 MG PO TB24
30.0000 mg | ORAL_TABLET | Freq: Every day | ORAL | Status: DC
  Administered 2021-07-11 – 2021-07-13 (×3): 30 mg via ORAL
  Filled 2021-07-11 (×3): qty 1

## 2021-07-11 MED ORDER — SODIUM CHLORIDE 0.9% IV SOLUTION: Freq: Once | INTRAVENOUS | Status: AC

## 2021-07-11 MED ORDER — TORSEMIDE 20 MG PO TABS
20.0000 mg | ORAL_TABLET | Freq: Every day | ORAL | Status: DC
  Administered 2021-07-11 – 2021-07-13 (×3): 20 mg via ORAL
  Filled 2021-07-11 (×4): qty 1

## 2021-07-11 MED ORDER — GLYCOPYRROLATE 1 MG PO TABS
2.0000 mg | ORAL_TABLET | Freq: Three times a day (TID) | ORAL | Status: DC | PRN
  Administered 2021-07-11 – 2021-07-12 (×2): 2 mg via ORAL
  Filled 2021-07-11 (×2): qty 2

## 2021-07-11 MED ORDER — FUROSEMIDE 10 MG/ML IJ SOLN
40.0000 mg | Freq: Once | INTRAMUSCULAR | Status: AC
  Administered 2021-07-11: 40 mg via INTRAVENOUS
  Filled 2021-07-11: qty 4

## 2021-07-11 NOTE — Progress Notes (Signed)
Subjective: Postpartum Day 1: Cesarean Delivery Patient reports incisional pain and tolerating PO.   Foley in place with excellent UOP, light yellow  Objective: Vital signs in last 24 hours: Temp:  [97.5 F (36.4 C)-97.9 F (36.6 C)] 97.6 F (36.4 C) (10/17 0725) Pulse Rate:  [71-90] 85 (10/17 0725) Resp:  [12-22] 18 (10/17 0725) BP: (101-177)/(50-85) 139/70 (10/17 0725) SpO2:  [90 %-100 %] 99 % (10/17 0725)  Physical Exam:  General: alert, cooperative, and no distress Lochia: appropriate Uterine Fundus: firm Incision: healing well, no significant drainage, no dehiscence, no significant erythema, Prevena in place DVT Evaluation: No evidence of DVT seen on physical exam.  CBC Latest Ref Rng & Units 07/11/2021 07/10/2021 07/10/2021  WBC 4.0 - 10.5 K/uL 17.8(H) 21.2(H) 18.9(H)  Hemoglobin 12.0 - 15.0 g/dL 6.4(LL) 8.3(L) 8.6(L)  Hematocrit 36.0 - 46.0 % 21.1(L) 27.0(L) 28.5(L)  Platelets 150 - 400 K/uL 323 369 358    CMP Latest Ref Rng & Units 07/11/2021 07/10/2021 07/09/2021  Glucose 70 - 99 mg/dL 008(Q) 761(P) 509(T)  BUN 6 - 20 mg/dL 20 13 7   Creatinine 0.44 - 1.00 mg/dL ) 2.67(T) 2.45(Y  Sodium 135 - 145 mmol/L 126(L) 130(L) 132(L)  Potassium 3.5 - 5.1 mmol/L 4.3 4.4 3.9  Chloride 98 - 111 mmol/L 94(L) 98 98  CO2 22 - 32 mmol/L 23 22 23   Calcium 8.9 - 10.3 mg/dL 8.0(L) 7.6(L) 7.7(L)  Total Protein 6.5 - 8.1 g/dL 5.3(L) 5.6(L) 6.1(L)  Total Bilirubin 0.3 - 1.2 mg/dL 0.3 0.6 0.6  Alkaline Phos 38 - 126 U/L 80 99 116  AST 15 - 41 U/L 20 24 21   ALT 0 - 44 U/L 12 14 15      Assessment/Plan: Status post Cesarean section.   Anemic now that she has equilibrated at 6.4, recheck this evening, may need PRBC based on symptoms, would chase withIV lasix given history of PP CHF  Begin procardia xl 30 and demadex 20 mg as planned  Prevena in place continue .  0.99 07/11/2021, 7:35 AM

## 2021-07-11 NOTE — Progress Notes (Signed)
Spoke with the patient of pros/cons of blood transfusion in s/o chronic HF with preserved EF and goal to minimize fluid overload. With heart disease history and hgb of 6.4 and acute kidney injurying (resolving), I would tranfuse one unit. I think this will also help with potential of third-spacing of fluid given her CHTN/SIPE. She agrees with plan.   We also did circ consent process. Plan will be for ideally circ today but potentially tomorrow depending on visits with family.   Bps well controlled on Procardia and torsemide (plan per OB Cards - note reviewed).   We will check HgB at 2000 but otherwise, anticipate no additional follow up needed for this. Will do some PO iron for home when discharged.

## 2021-07-12 ENCOUNTER — Encounter: Payer: BC Managed Care – PPO | Admitting: Obstetrics & Gynecology

## 2021-07-12 LAB — CBC WITH DIFFERENTIAL/PLATELET
Abs Immature Granulocytes: 0.35 10*3/uL — ABNORMAL HIGH (ref 0.00–0.07)
Basophils Absolute: 0 10*3/uL (ref 0.0–0.1)
Basophils Relative: 0 %
Eosinophils Absolute: 0.1 10*3/uL (ref 0.0–0.5)
Eosinophils Relative: 1 %
HCT: 23 % — ABNORMAL LOW (ref 36.0–46.0)
Hemoglobin: 7.3 g/dL — ABNORMAL LOW (ref 12.0–15.0)
Immature Granulocytes: 2 %
Lymphocytes Relative: 11 %
Lymphs Abs: 1.8 10*3/uL (ref 0.7–4.0)
MCH: 22.6 pg — ABNORMAL LOW (ref 26.0–34.0)
MCHC: 31.7 g/dL (ref 30.0–36.0)
MCV: 71.2 fL — ABNORMAL LOW (ref 80.0–100.0)
Monocytes Absolute: 1.4 10*3/uL — ABNORMAL HIGH (ref 0.1–1.0)
Monocytes Relative: 9 %
Neutro Abs: 11.8 10*3/uL — ABNORMAL HIGH (ref 1.7–7.7)
Neutrophils Relative %: 77 %
Platelets: 333 10*3/uL (ref 150–400)
RBC: 3.23 MIL/uL — ABNORMAL LOW (ref 3.87–5.11)
RDW: 18.5 % — ABNORMAL HIGH (ref 11.5–15.5)
WBC: 15.4 10*3/uL — ABNORMAL HIGH (ref 4.0–10.5)
nRBC: 0.3 % — ABNORMAL HIGH (ref 0.0–0.2)

## 2021-07-12 LAB — COMPREHENSIVE METABOLIC PANEL
ALT: 15 U/L (ref 0–44)
AST: 19 U/L (ref 15–41)
Albumin: 1.8 g/dL — ABNORMAL LOW (ref 3.5–5.0)
Alkaline Phosphatase: 73 U/L (ref 38–126)
Anion gap: 11 (ref 5–15)
BUN: 20 mg/dL (ref 6–20)
CO2: 25 mmol/L (ref 22–32)
Calcium: 8.8 mg/dL — ABNORMAL LOW (ref 8.9–10.3)
Chloride: 103 mmol/L (ref 98–111)
Creatinine, Ser: 0.96 mg/dL (ref 0.44–1.00)
GFR, Estimated: >60 mL/min (ref >60)
Glucose, Bld: 83 mg/dL (ref 70–99)
Potassium: 3.7 mmol/L (ref 3.5–5.1)
Sodium: 139 mmol/L (ref 135–145)
Total Bilirubin: 0.3 mg/dL (ref 0.3–1.2)
Total Protein: 5.7 g/dL — ABNORMAL LOW (ref 6.5–8.1)

## 2021-07-12 LAB — TYPE AND SCREEN
ABO/RH(D): A POS
Antibody Screen: NEGATIVE
Unit division: 0

## 2021-07-12 LAB — BPAM RBC
Blood Product Expiration Date: 202211022359
ISSUE DATE / TIME: 202210171340
Unit Type and Rh: 6200

## 2021-07-12 LAB — SURGICAL PATHOLOGY

## 2021-07-12 MED ORDER — PANTOPRAZOLE SODIUM 20 MG PO TBEC
20.0000 mg | DELAYED_RELEASE_TABLET | Freq: Every day | ORAL | Status: DC
  Administered 2021-07-12 – 2021-07-13 (×2): 20 mg via ORAL
  Filled 2021-07-12 (×2): qty 1

## 2021-07-12 NOTE — Progress Notes (Signed)
Subjective: Postpartum Day 2: Cesarean Delivery Patient reports incisional pain, tolerating PO, + flatus, and no problems voiding.    Objective: Vital signs in last 24 hours: Temp:  [97.6 F (36.4 C)-99.9 F (37.7 C)] 98.3 F (36.8 C) (10/18 0421) Pulse Rate:  [85-110] 103 (10/18 0421) Resp:  [16-20] 20 (10/18 0421) BP: (111-139)/(51-76) 133/76 (10/18 0421) SpO2:  [87 %-100 %] 95 % (10/18 0421)  Physical Exam:  General: alert, cooperative, and no distress Lochia: appropriate Uterine Fundus: firm Incision: healing well, no significant drainage, no dehiscence, no significant erythema, Prevena inplace DVT Evaluation: No evidence of DVT seen on physical exam.  CBC Latest Ref Rng & Units 07/12/2021 07/11/2021 07/10/2021  WBC 4.0 - 10.5 K/uL 15.4(H) 17.8(H) 21.2(H)  Hemoglobin 12.0 - 15.0 g/dL 7.3(L) 6.4(LL) 8.3(L)  Hematocrit 36.0 - 46.0 % 23.0(L) 21.1(L) 27.0(L)  Platelets 150 - 400 K/uL 333 323 369   CMP Latest Ref Rng & Units 07/12/2021 07/11/2021 07/10/2021  Glucose 70 - 99 mg/dL 83 595(G) 387(F)  BUN 6 - 20 mg/dL 20 20 13   Creatinine 0.44 - 1.00 mg/dL 6.43) 3.29(J)  Sodium 135 - 145 mmol/L 139 126(L) 130(L)  Potassium 3.5 - 5.1 mmol/L 3.7 4.3 4.4  Chloride 98 - 111 mmol/L 103 94(L) 98  CO2 22 - 32 mmol/L 25 23 22   Calcium 8.9 - 10.3 mg/dL 1.88(C) ) 7.6(L)  Total Protein 6.5 - 8.1 g/dL 1.6(S) 5.3(L) 5.6(L)  Total Bilirubin 0.3 - 1.2 mg/dL 0.3 0.3 0.6  Alkaline Phos 38 - 126 U/L 73 80 99  AST 15 - 41 U/L 19 20 24   ALT 0 - 44 U/L 15 12 14         Assessment/Plan: Status post Cesarean section. Doing well postoperatively.  Continue current care. Procardia xl 30 + demadex 20 daily Recheck CBC in am 0.6(T 07/12/2021, 7:10 AM

## 2021-07-13 ENCOUNTER — Ambulatory Visit: Payer: BC Managed Care – PPO

## 2021-07-13 ENCOUNTER — Other Ambulatory Visit (HOSPITAL_COMMUNITY): Payer: Self-pay

## 2021-07-13 LAB — CBC WITH DIFFERENTIAL/PLATELET
Abs Immature Granulocytes: 0.49 10*3/uL — ABNORMAL HIGH (ref 0.00–0.07)
Basophils Absolute: 0 10*3/uL (ref 0.0–0.1)
Basophils Relative: 0 %
Eosinophils Absolute: 0.3 10*3/uL (ref 0.0–0.5)
Eosinophils Relative: 2 %
HCT: 23.4 % — ABNORMAL LOW (ref 36.0–46.0)
Hemoglobin: 7.3 g/dL — ABNORMAL LOW (ref 12.0–15.0)
Immature Granulocytes: 4 %
Lymphocytes Relative: 18 %
Lymphs Abs: 2.4 10*3/uL (ref 0.7–4.0)
MCH: 22.4 pg — ABNORMAL LOW (ref 26.0–34.0)
MCHC: 31.2 g/dL (ref 30.0–36.0)
MCV: 71.8 fL — ABNORMAL LOW (ref 80.0–100.0)
Monocytes Absolute: 1.4 10*3/uL — ABNORMAL HIGH (ref 0.1–1.0)
Monocytes Relative: 11 %
Neutro Abs: 8.3 10*3/uL — ABNORMAL HIGH (ref 1.7–7.7)
Neutrophils Relative %: 65 %
Platelets: 361 10*3/uL (ref 150–400)
RBC: 3.26 MIL/uL — ABNORMAL LOW (ref 3.87–5.11)
RDW: 18.9 % — ABNORMAL HIGH (ref 11.5–15.5)
WBC: 12.9 10*3/uL — ABNORMAL HIGH (ref 4.0–10.5)
nRBC: 0.3 % — ABNORMAL HIGH (ref 0.0–0.2)

## 2021-07-13 MED ORDER — ACETAMINOPHEN 325 MG PO TABS: 650.0000 mg | ORAL_TABLET | ORAL | 0 refills | Status: DC | PRN

## 2021-07-13 MED ORDER — IBUPROFEN 600 MG PO TABS: 600.0000 mg | ORAL_TABLET | Freq: Four times a day (QID) | ORAL | 0 refills | Status: DC

## 2021-07-13 MED ORDER — FERROUS SULFATE 325 (65 FE) MG PO TABS
325.0000 mg | ORAL_TABLET | ORAL | 3 refills | Status: DC
  Filled 2021-07-13: qty 30, 60d supply, fill #0

## 2021-07-13 MED ORDER — COCONUT OIL OIL
1.0000 | TOPICAL_OIL | 0 refills | Status: DC | PRN
Start: 2021-07-13 — End: 2021-07-13

## 2021-07-13 MED ORDER — IBUPROFEN 600 MG PO TABS
600.0000 mg | ORAL_TABLET | Freq: Four times a day (QID) | ORAL | 0 refills | Status: DC
  Filled 2021-07-13: qty 30, 8d supply, fill #0

## 2021-07-13 MED ORDER — PANTOPRAZOLE SODIUM 20 MG PO TBEC
20.0000 mg | DELAYED_RELEASE_TABLET | Freq: Every day | ORAL | 1 refills | Status: DC
  Filled 2021-07-13: qty 30, 30d supply, fill #0

## 2021-07-13 MED ORDER — NIFEDIPINE ER 30 MG PO TB24: 30.0000 mg | ORAL_TABLET | Freq: Every day | ORAL | 1 refills | Status: DC

## 2021-07-13 MED ORDER — NIFEDIPINE ER 30 MG PO TB24
30.0000 mg | ORAL_TABLET | Freq: Every day | ORAL | 1 refills | Status: DC
  Filled 2021-07-13: qty 30, 30d supply, fill #0

## 2021-07-13 MED ORDER — COCONUT OIL OIL
1.0000 | TOPICAL_OIL | 0 refills | Status: DC | PRN
Start: 2021-07-13 — End: 2021-08-10

## 2021-07-13 MED ORDER — ACETAMINOPHEN 325 MG PO TABS
650.0000 mg | ORAL_TABLET | ORAL | 0 refills | Status: DC | PRN
  Filled 2021-07-13: qty 60, 5d supply, fill #0

## 2021-07-13 MED ORDER — GLYCOPYRROLATE 1 MG PO TABS: 2.0000 mg | ORAL_TABLET | Freq: Three times a day (TID) | ORAL | 1 refills | Status: DC | PRN

## 2021-07-13 MED ORDER — GLYCOPYRROLATE 1 MG PO TABS
2.0000 mg | ORAL_TABLET | Freq: Three times a day (TID) | ORAL | 1 refills | Status: DC | PRN
Start: 2021-07-13 — End: 2021-08-10
  Filled 2021-07-13: qty 60, 10d supply, fill #0

## 2021-07-13 MED ORDER — TORSEMIDE 20 MG PO TABS: 20.0000 mg | ORAL_TABLET | Freq: Every day | ORAL | 0 refills | Status: DC

## 2021-07-13 MED ORDER — TORSEMIDE 20 MG PO TABS
20.0000 mg | ORAL_TABLET | Freq: Every day | ORAL | 0 refills | Status: DC
  Filled 2021-07-13: qty 5, 5d supply, fill #0

## 2021-07-13 NOTE — Plan of Care (Signed)
  Problem: Health Behavior/Discharge Planning: Goal: Ability to manage health-related needs will improve Outcome: Completed/Met

## 2021-07-14 ENCOUNTER — Other Ambulatory Visit (HOSPITAL_COMMUNITY): Payer: Self-pay

## 2021-07-20 ENCOUNTER — Other Ambulatory Visit: Payer: Self-pay

## 2021-07-20 ENCOUNTER — Ambulatory Visit (INDEPENDENT_AMBULATORY_CARE_PROVIDER_SITE_OTHER): Payer: BC Managed Care – PPO | Admitting: Obstetrics and Gynecology

## 2021-07-20 DIAGNOSIS — Z4889 Encounter for other specified surgical aftercare: Secondary | ICD-10-CM | POA: Insufficient documentation

## 2021-07-20 NOTE — Progress Notes (Signed)
  CC: wound check Subjective:    Patient ID: Suzanne [name], female    DOB: Nov 03, 1985, 35 y.o.   MRN: [id number]  HPI Pt seen 10 days post op for removal of preveena and  c section wound check.  Device removed and wound was clean dry and intact.  Blood pressure normal.  Moderate amount of adhesive was still seen under pannus.  Attempted to remove with warm water.  Some still present.    Review of Systems     Objective:   Physical Exam Vitals:   07/20/21 1116  BP: 106/72  Pulse: (!) 105         Assessment & Plan:   1. Encounter for postoperative wound check Intact c section incision. Pt advise to use baby oil to help remove adhesive.  Follow up for regularly scheduled postpartum visit.    Warden Fillers, MD Faculty Attending, [facility]

## 2021-07-20 NOTE — Progress Notes (Signed)
Pt is 10 days post c/s, here today for incision check/vac removal and BP check.   Pt states she has rash on back, ? From epidural or tape.  EPDS score today 0.

## 2021-07-22 ENCOUNTER — Other Ambulatory Visit: Payer: Self-pay

## 2021-07-22 ENCOUNTER — Ambulatory Visit (INDEPENDENT_AMBULATORY_CARE_PROVIDER_SITE_OTHER): Payer: BC Managed Care – PPO | Admitting: Cardiology

## 2021-07-22 ENCOUNTER — Encounter: Payer: Self-pay | Admitting: Cardiology

## 2021-07-22 VITALS — BP 120/82 | HR 105 | Ht 59.0 in | Wt 255.3 lb

## 2021-07-22 DIAGNOSIS — O10919 Unspecified pre-existing hypertension complicating pregnancy, unspecified trimester: Secondary | ICD-10-CM

## 2021-07-22 NOTE — Progress Notes (Signed)
Cardio-Obstetrics Clinic  Follow Up Note   Date:  07/22/2021   ID:  Suzanne [name], DOB [birth year], MRN [id number]  PCP:  Doreene Nest, NP   [facility] Providers Cardiologist:  Thomasene Ripple, DO  Electrophysiologist:  None        Referring MD: Doreene Nest, NP   Chief Complaint: " I am doing good"  History of Present Illness:    Suzanne [name] is a 35 y.o. female [G3P2012] who returns for follow up for postpartum visit for chronic hypertension.  I initially saw the patient on Feb 11, 2021 at that time she was referred to the cardio obstetrics clinic given her history of heart failure with preserved ejection fraction and hypertension.  At that visit she had been off hypertensive medications and her blood pressure was acceptable.  No medication was started.  She did have bilateral leg edema which was suspected to be dependent edema from her work.  We ordered a repeat echocardiogram.   I saw the patient on June 01, 2021 at that time she has had her echocardiogram which showed normal ejection fraction.  Since I saw the patient she has delivered via C-section.  She recently saw her OB/GYN and it was noted that her C-section incision is intact.  She denies any chest pain or shortness of breath.  She responded well to the torsemide that was started at discharge for 5 days postpartum.  No other complaints at this time.  She tells me about her newborn baby boy Antonio otherwise is doing well but has had some mild diarrhea with his formula.  She is working with the pediatrician to fix this.  She has had a good state of mind.  Although she is going through this every time without her son's father who was killed in a car accident.  She denies any depression or anxiety at this time.   Prior CV Studies Reviewed: The following studies were reviewed today: TTE Apr 02, 2021 IMPRESSIONS   1. Left ventricular ejection fraction, by estimation, is 60 to 65%. The left ventricle has  normal function. The left ventricle has no regional wall motion abnormalities. Left ventricular diastolic parameters were normal. The average left ventricular  global longitudinal strain is -23.7 %. The global longitudinal strain is normal.   2. Right ventricular systolic function is normal. The right ventricular  size is normal. Tricuspid regurgitation signal is inadequate for assessing  PA pressure.   3. The mitral valve is normal in structure. No evidence of mitral valve  regurgitation. No evidence of mitral stenosis.   4. The aortic valve is normal in structure. Aortic valve regurgitation is  not visualized. No aortic stenosis is present.   5. The inferior vena cava is normal in size with greater than 50%  respiratory variability, suggesting right atrial pressure of 3 mmHg.   FINDINGS   Left Ventricle: Left ventricular ejection fraction, by estimation, is 60  to 65%. The left ventricle has normal function. The left ventricle has no  regional wall motion abnormalities. The average left ventricular global  longitudinal strain is -23.7 %.  The global longitudinal strain is normal. The left ventricular internal  cavity size was normal in size. There is no left ventricular hypertrophy.  Left ventricular diastolic parameters were normal. Normal left ventricular  filling pressure.   Right Ventricle: The right ventricular size is normal. No increase in  right ventricular wall thickness. Right ventricular systolic function is  normal. Tricuspid regurgitation signal is inadequate for  assessing PA  pressure.   Left Atrium: Left atrial size was normal in size.   Right Atrium: Right atrial size was normal in size.   Pericardium: There is no evidence of pericardial effusion.   Mitral Valve: The mitral valve is normal in structure. No evidence of  mitral valve regurgitation. No evidence of mitral valve stenosis.   Tricuspid Valve: The tricuspid valve is normal in structure. Tricuspid  valve  regurgitation is not demonstrated. No evidence of tricuspid  stenosis.   Aortic Valve: The aortic valve is normal in structure. Aortic valve  regurgitation is not visualized. No aortic stenosis is present.   Pulmonic Valve: The pulmonic valve was normal in structure. Pulmonic valve  regurgitation is trivial. No evidence of pulmonic stenosis.   Aorta: The aortic root is normal in size and structure.   Venous: The inferior vena cava is normal in size with greater than 50%  respiratory variability, suggesting right atrial pressure of 3 mmHg.   IAS/Shunts: No atrial level shunt detected by color flow Doppler.    Past Medical History:  Diagnosis Date   Acid reflux    Blood transfusion without reported diagnosis    Hypertension    Sickle cell trait [facility])     Past Surgical History:  Procedure Laterality Date   CESAREAN SECTION N/A 10/08/2013   Procedure: CESAREAN SECTION;  Surgeon: Antionette Char, MD;  Location: [facility] ORS;  Service: Obstetrics;  Laterality: N/A;   CESAREAN SECTION N/A 07/10/2021   Procedure: CESAREAN SECTION;  Surgeon: [name], MD;  Location: [facility] LD ORS;  Service: Obstetrics;  Laterality: N/A;      OB History     Gravida  3   Para  2   Term  2   Preterm      AB  1   Living  2      SAB      IAB  1   Ectopic      Multiple  0   Live Births  2               Current Medications: Current Meds  Medication Sig   acetaminophen (TYLENOL) 325 MG tablet Take 2 tablets (650 mg total) by mouth every 4 (four) hours as needed for mild pain (for pain scale < 4).   ferrous sulfate (FERROUSUL) 325 (65 FE) MG tablet Take 1 tablet (325 mg total) by mouth every other day.   glycopyrrolate (ROBINUL) 1 MG tablet Take 2 tablets (2 mg total) by mouth 3 (three) times daily as needed (spitting).   ibuprofen (ADVIL) 600 MG tablet Take 1 tablet (600 mg total) by mouth every 6 (six) hours.   NIFEdipine (ADALAT CC) 30 MG 24 hr tablet Take 1 tablet (30 mg total)  by mouth daily.   pantoprazole (PROTONIX) 20 MG tablet Take 1 tablet (20 mg total) by mouth daily.   Prenat-Fe Poly-Methfol-FA-DHA (VITAFOL ULTRA) 29-0.6-0.4-200 MG CAPS Take 1 capsule by mouth daily before breakfast.   [DISCONTINUED] acetaminophen (TYLENOL) 325 MG tablet Take 2 tablets (650 mg total) by mouth every 4 (four) hours as needed for mild pain (for pain scale < 4).   [DISCONTINUED] glycopyrrolate (ROBINUL) 1 MG tablet Take 2 tablets (2 mg total) by mouth 3 (three) times daily as needed (spitting).   [DISCONTINUED] ibuprofen (ADVIL) 600 MG tablet Take 1 tablet (600 mg total) by mouth every 6 (six) hours.   [DISCONTINUED] NIFEdipine (ADALAT CC) 30 MG 24 hr tablet Take 1 tablet (30  mg total) by mouth daily.     Allergies:   Hydralazine and Lasix [furosemide]   Social History   Socioeconomic History   Marital status: Single    Spouse name: Not on file   Number of children: Not on file   Years of education: Not on file   Highest education level: Not on file  Occupational History   Not on file  Tobacco Use   Smoking status: Never   Smokeless tobacco: Never  Vaping Use   Vaping Use: Never used  Substance and Sexual Activity   Alcohol use: No   Drug use: No   Sexual activity: Not Currently    Partners: Male    Birth control/protection: None  Other Topics Concern   Not on file  Social History Narrative   Completed some college   Works at [employer]   From [place]   Has one son, 7 years.   Enjoys playing with her son, sleeping.   Family is established here as well.   Social Determinants of Health   Financial Resource Strain: Low Risk    Difficulty of Paying Living Expenses: Not hard at all  Food Insecurity: No Food Insecurity   Worried About Programme researcher, broadcasting/film/video in the Last Year: Never true   Ran Out of Food in the Last Year: Never true  Transportation Needs: No Transportation Needs   Lack of Transportation (Medical): No   Lack of Transportation (Non-Medical): No   Physical Activity: Unknown   Days of Exercise per Week: 0 days   Minutes of Exercise per Session: Not on file  Stress: Stress Concern Present   Feeling of Stress : To some extent  Social Connections: Unknown   Frequency of Communication with Friends and Family: More than three times a week   Frequency of Social Gatherings with Friends and Family: More than three times a week   Attends Religious Services: Not on Scientist, clinical (histocompatibility and immunogenetics) or Organizations: Not on file   Attends Banker Meetings: Not on file   Marital Status: Not on file      Family History  Problem Relation Age of Onset   Diabetes Mother    Arthritis Mother    Hypertension Mother    Diabetes Maternal Grandmother    Hypertension Maternal Grandmother    Cancer Neg Hx    Heart disease Neg Hx       ROS:   Please see the history of present illness.     All other systems reviewed and are negative.   Labs/EKG Reviewed:    EKG:   EKG is was not ordered today.    Recent Labs: 07/10/2021: Magnesium 8.3 07/12/2021: ALT 15; BUN 20; Creatinine, Ser 0.96; Potassium 3.7; Sodium 139 07/13/2021: Hemoglobin 7.3; Platelets 361   Recent Lipid Panel Lab Results  Component Value Date/Time   CHOL 155 10/23/2018 10:04 AM   TRIG 87.0 10/23/2018 10:04 AM   HDL 34.90 (L) 10/23/2018 10:04 AM   CHOLHDL 4 10/23/2018 10:04 AM   LDLCALC 103 (H) 10/23/2018 10:04 AM    Physical Exam:    VS:  BP 120/82 (BP Location: Left Arm)   Pulse (!) 105   Ht 4\' 11"  (1.499 m)   Wt 255 lb 4.8 oz (115.8 kg)   LMP 10/17/2020   SpO2 97%   BMI 51.56 kg/m     Wt Readings from Last 3 Encounters:  07/22/21 255 lb 4.8 oz (115.8 kg)  07/20/21 253 lb (114.8  kg)  07/07/21 290 lb 4.8 oz (131.7 kg)     GEN:  Well nourished, well developed in no acute distress HEENT: Normal NECK: No JVD; No carotid bruits LYMPHATICS: No lymphadenopathy CARDIAC: RRR, no murmurs, rubs, gallops RESPIRATORY:  Clear to auscultation without  rales, wheezing or rhonchi  ABDOMEN: Soft, non-tender, non-distended MUSCULOSKELETAL:  No edema; No deformity  SKIN: Warm and dry NEUROLOGIC:  Alert and oriented x 3 PSYCHIATRIC:  Normal affect    Risk Assessment/Risk Calculators:                 ASSESSMENT & PLAN:    Chronic hypertension in pregnancy Obesity History of chronic diastolic heart failure  She appears to be euvolemic.  She responded well to torsemide without any side effects.  Her blood pressure is at target.  She is on nifedipine 30 mg daily we will continue this for now.  She does not have any signs of postpartum depression we will continue to monitor the patient. She will take her blood pressure daily and should she have blood pressure greater than 130/80 to notify my office.  The patient is in agreement with the above plan. The patient left the office in stable condition.  The patient will follow up in 8 weeks  Patient Instructions  Medication Instructions:  Your physician recommends that you continue on your current medications as directed. Please refer to the Current Medication list given to you today.  *If you need a refill on your cardiac medications before your next appointment, please call your pharmacy*   Lab Work: None If you have labs (blood work) drawn today and your tests are completely normal, you will receive your results only by: MyChart Message (if you have MyChart) OR A paper copy in the mail If you have any lab test that is abnormal or we need to change your treatment, we will call you to review the results.   Testing/Procedures: None   Follow-Up: At [facility], you and your health needs are our priority.  As part of our continuing mission to provide you with exceptional heart care, we have created designated Provider Care Teams.  These Care Teams include your primary Cardiologist (physician) and Advanced Practice Providers (APPs -  Physician Assistants and Nurse Practitioners)  who all work together to provide you with the care you need, when you need it.  We recommend signing up for the patient portal called "MyChart".  Sign up information is provided on this After Visit Summary.  MyChart is used to connect with patients for Virtual Visits (Telemedicine).  Patients are able to view lab/test results, encounter notes, upcoming appointments, etc.  Non-urgent messages can be sent to your provider as well.   To learn more about what you can do with MyChart, go to [email or web address].    Your next appointment:   8 week(s)  The format for your next appointment:   In Person  Provider:   Thomasene Ripple, DO [address], [place], [place] [address] Or [facility] [address] [place], [place] [address]    Other Instructions     Dispo:  No follow-ups on file.   Medication Adjustments/Labs and Tests Ordered: Current medicines are reviewed at length with the patient today.  Concerns regarding medicines are outlined above.  Tests Ordered: No orders of the defined types were placed in this encounter.  Medication Changes: No orders of the defined types were placed in this encounter.

## 2021-07-22 NOTE — Patient Instructions (Signed)
Medication Instructions:  Your physician recommends that you continue on your current medications as directed. Please refer to the Current Medication list given to you today.  *If you need a refill on your cardiac medications before your next appointment, please call your pharmacy*   Lab Work: None If you have labs (blood work) drawn today and your tests are completely normal, you will receive your results only by: MyChart Message (if you have MyChart) OR A paper copy in the mail If you have any lab test that is abnormal or we need to change your treatment, we will call you to review the results.   Testing/Procedures: None   Follow-Up: At [facility], you and your health needs are our priority.  As part of our continuing mission to provide you with exceptional heart care, we have created designated Provider Care Teams.  These Care Teams include your primary Cardiologist (physician) and Advanced Practice Providers (APPs -  Physician Assistants and Nurse Practitioners) who all work together to provide you with the care you need, when you need it.  We recommend signing up for the patient portal called "MyChart".  Sign up information is provided on this After Visit Summary.  MyChart is used to connect with patients for Virtual Visits (Telemedicine).  Patients are able to view lab/test results, encounter notes, upcoming appointments, etc.  Non-urgent messages can be sent to your provider as well.   To learn more about what you can do with MyChart, go to [email or web address].    Your next appointment:   8 week(s)  The format for your next appointment:   In Person  Provider:   Thomasene Ripple, DO [address], [place], [place] [address] Or [facility] [address] [place], [place] [address]    Other Instructions

## 2021-07-23 ENCOUNTER — Telehealth (HOSPITAL_COMMUNITY): Payer: Self-pay

## 2021-07-23 NOTE — Telephone Encounter (Signed)
"  I'm good." Patient has no questions or concerns about her healing.  "Baby is eating and gaining weight. Baby sleeps in a bassinet." RN reviewed ABC's of safe sleep with patient. Patient declines any questions or concerns about baby.  EPDS score is 0.  Marcelino Duster [facility] 07/23/2021,1203

## 2021-07-24 ENCOUNTER — Inpatient Hospital Stay (HOSPITAL_COMMUNITY): Admit: 2021-07-24 | Payer: Self-pay

## 2021-08-01 ENCOUNTER — Other Ambulatory Visit: Payer: Self-pay | Admitting: Obstetrics and Gynecology

## 2021-08-01 DIAGNOSIS — K219 Gastro-esophageal reflux disease without esophagitis: Secondary | ICD-10-CM

## 2021-08-10 ENCOUNTER — Other Ambulatory Visit: Payer: Self-pay

## 2021-08-10 ENCOUNTER — Encounter: Payer: Self-pay | Admitting: Obstetrics and Gynecology

## 2021-08-10 ENCOUNTER — Ambulatory Visit (INDEPENDENT_AMBULATORY_CARE_PROVIDER_SITE_OTHER): Payer: BC Managed Care – PPO | Admitting: Obstetrics and Gynecology

## 2021-08-10 DIAGNOSIS — Z30430 Encounter for insertion of intrauterine contraceptive device: Secondary | ICD-10-CM | POA: Diagnosis not present

## 2021-08-10 LAB — POCT URINE PREGNANCY: Preg Test, Ur: NEGATIVE

## 2021-08-10 MED ORDER — IBUPROFEN 600 MG PO TABS: 600.0000 mg | ORAL_TABLET | Freq: Four times a day (QID) | ORAL | 0 refills | Status: DC

## 2021-08-10 MED ORDER — GLYCOPYRROLATE 1 MG PO TABS
2.0000 mg | ORAL_TABLET | Freq: Three times a day (TID) | ORAL | 1 refills | Status: DC | PRN
Start: 2021-08-10 — End: 2022-03-29

## 2021-08-10 MED ORDER — LEVONORGESTREL 20.1 MCG/DAY IU IUD: 1.0000 | INTRAUTERINE_SYSTEM | Freq: Once | INTRAUTERINE | Status: DC

## 2021-08-10 NOTE — Progress Notes (Signed)
    Post Partum Visit Note  Suzanne [name] is a 35 y.o. G32P2012 female who presents for a postpartum visit. She is 4 weeks postpartum following a repeat cesarean section.  I have fully reviewed the prenatal and intrapartum course. The delivery was at 105w4d gestational weeks.  Anesthesia: spinal. Postpartum course has been unremarkable. Baby is doing well. Baby is feeding by bottle - Gerber Soothe Pro . Bleeding no bleeding. Bowel function is normal. Bladder function is normal. Patient is not sexually active. Contraception method is Nexplanon. Postpartum depression screening: negative.   The pregnancy intention screening data noted above was reviewed. Potential methods of contraception were discussed. The patient elected to proceed with Nexplanon.   The following portions of the patient's history were reviewed and updated as appropriate: allergies, current medications, past family history, past medical history, past social history, past surgical history, and problem list.  Review of Systems Pertinent items noted in HPI and remainder of comprehensive ROS otherwise negative.    Objective:  BP 114/79   Pulse (!) 108   Ht 4\' 11"  (1.499 m)   Wt 253 lb (114.8 kg)   LMP 10/17/2020   Breastfeeding No   BMI 51.10 kg/m    General:  alert   Breasts:  NA  Lungs: clear to auscultation bilaterally  Heart:  regular rate and rhythm, S1, S2 normal, no murmur, click, rub or gallop  Abdomen: soft, non-tender; bowel sounds normal; no masses,  no organomegaly   Vulva:  normal  Vagina: normal vagina  Cervix:  no lesions  Corpus: normal  Adnexa:  normal adnexa  Rectal Exam: Not performed.        Assessment:    Nl postpartum exam. Pap smear not done at today's visit.   CHF  H/O GDM  Plan:   Essential components of care per ACOG recommendations:  1.  Mood and well being: Patient with negative depression screening today. Reviewed local resources for support.  - Patient does not use tobacco. -  hx of drug use? No   2. Infant care and feeding:  -Patient currently breastmilk feeding? Yes If breastmilk feeding discussed return to work and pumping. If needed, patient was provided letter for work to allow for every 2-3 hr pumping breaks, and to be granted a private location to express breastmilk and refrigerated area to store breastmilk. Reviewed importance of draining breast regularly to support lactation. -Social determinants of health (SDOH) reviewed in EPIC. No concerns  3. Sexuality, contraception and birth spacing - Patient does not know want a pregnancy in the next year.  Desired family size is uncertain children.  - Reviewed forms of contraception in tiered fashion. Patient desired  IUD. Unable to place today, due to inability to visualize cervix after several attempts. Pt desired to abandon procedure and reschedule appt for Nexplanon insertion  today.   - Discussed birth spacing of 18 months  4. Sleep and fatigue -Encouraged family/partner/community support of 4 hrs of uninterrupted sleep to help with mood and fatigue  5. Physical Recovery  - Discussed patients delivery and complications - Patient has urinary incontinence? No - Patient is safe to resume physical and sexual activity  6.  Health Maintenance  Will schedule Glucola  7. Chronic Disease To continue follow care with cardiology   10/19/2020, MD Center for [facility], [facility]

## 2021-08-10 NOTE — Patient Instructions (Addendum)

## 2021-08-16 ENCOUNTER — Other Ambulatory Visit: Payer: Self-pay | Admitting: Obstetrics and Gynecology

## 2021-08-16 DIAGNOSIS — K219 Gastro-esophageal reflux disease without esophagitis: Secondary | ICD-10-CM

## 2021-08-17 NOTE — Telephone Encounter (Signed)
Nexium, drug in the same class, can be purchased OTC if needed

## 2021-08-23 ENCOUNTER — Other Ambulatory Visit: Payer: Self-pay

## 2021-08-23 ENCOUNTER — Other Ambulatory Visit: Payer: BC Managed Care – PPO

## 2021-08-24 LAB — GLUCOSE TOLERANCE, 2 HOURS
Glucose, 2 hour: 119 mg/dL (ref 70–139)
Glucose, GTT - Fasting: 103 mg/dL — ABNORMAL HIGH (ref 70–99)

## 2021-09-01 ENCOUNTER — Ambulatory Visit (INDEPENDENT_AMBULATORY_CARE_PROVIDER_SITE_OTHER): Payer: BC Managed Care – PPO | Admitting: Primary Care

## 2021-09-01 ENCOUNTER — Encounter: Payer: Self-pay | Admitting: Primary Care

## 2021-09-01 ENCOUNTER — Other Ambulatory Visit: Payer: Self-pay

## 2021-09-01 VITALS — BP 126/82 | HR 100 | Temp 98.1°F | Ht 59.0 in | Wt 248.0 lb

## 2021-09-01 DIAGNOSIS — K117 Disturbances of salivary secretion: Secondary | ICD-10-CM

## 2021-09-01 DIAGNOSIS — I10 Essential (primary) hypertension: Secondary | ICD-10-CM

## 2021-09-01 DIAGNOSIS — Z23 Encounter for immunization: Secondary | ICD-10-CM

## 2021-09-01 DIAGNOSIS — K219 Gastro-esophageal reflux disease without esophagitis: Secondary | ICD-10-CM

## 2021-09-01 DIAGNOSIS — R739 Hyperglycemia, unspecified: Secondary | ICD-10-CM | POA: Insufficient documentation

## 2021-09-01 LAB — POCT GLYCOSYLATED HEMOGLOBIN (HGB A1C): Hemoglobin A1C: 5.1 % (ref 4.0–5.6)

## 2021-09-01 NOTE — Assessment & Plan Note (Signed)
Controlled in the office today. Did have complications during recent pregnancy.  Continue nifedipine 30 mg daily.  Reviewed cardiology notes from October 2022 and hospital notes from October 2022.

## 2021-09-01 NOTE — Assessment & Plan Note (Signed)
Doing well and improving on glycopyrrolate 2 mg TID PRN, continue same.

## 2021-09-01 NOTE — Progress Notes (Signed)
Subjective:    Patient ID: Suzanne [name], female    DOB: Sep 15, 1986, 35 y.o.   MRN: [id number]  HPI  Suzanne [name] is a very pleasant 35 y.o. female with a history of hypertension, acute diastolic CHF, GERD, morbid obesity, hyperglycemia who presents today for follow up of chronic conditions and to discuss hyperglycemia.   1) Essential Hypertension: Chronic. Two months post partum, currently managed on nifedipine 30 mg daily.  Complications of hypertension during pregnancy, did deliver her son about 14 days early. Admitted from October 13th through 18th, discharged on the 19th. Treated with IV Magnesium.   Following with cardiology, last visit being October 2022. She underwent echocardiogram in September 2022 which revealed a normal ejection fraction. She was prescribed torsemide for a five day course at discharge from her c-section.  BP Readings from Last 3 Encounters:  09/01/21 126/82  08/10/21 114/79  07/22/21 120/82   2) GERD: Currently prescribed pantoprazole 20 mg daily and glycopyrrolate 1 mg PRN for saliva and spitting for which she developed during pregnancy.   She continues to do well on both medications, saliva intensity has improved since postpartum.   3) Hyperglycemia: Failed all glucose tolerance tests during her pregnancy. Was managed on metformin 500 mg with dinner per GYN, discontinued about two months ago after the birth of her child.   She denies feeling jittery, chest pain, polydipsia, polyuria. Family history of diabetes on both sides of her family.   Review of Systems  Respiratory:  Negative for shortness of breath.   Cardiovascular:  Negative for chest pain.  Endocrine: Negative for polydipsia, polyphagia and polyuria.        Past Medical History:  Diagnosis Date   Acid reflux    Blood transfusion without reported diagnosis    Hypertension    Sickle cell trait (HCC)     Social History   Socioeconomic History   Marital status: Single    Spouse  name: Not on file   Number of children: Not on file   Years of education: Not on file   Highest education level: Not on file  Occupational History   Not on file  Tobacco Use   Smoking status: Never   Smokeless tobacco: Never  Vaping Use   Vaping Use: Never used  Substance and Sexual Activity   Alcohol use: No   Drug use: No   Sexual activity: Not Currently    Partners: Male    Birth control/protection: None  Other Topics Concern   Not on file  Social History Narrative   Completed some college   Works at [employer]   From [place]   Has one son, 7 years.   Enjoys playing with her son, sleeping.   Family is established here as well.   Social Determinants of Health   Financial Resource Strain: Low Risk    Difficulty of Paying Living Expenses: Not hard at all  Food Insecurity: No Food Insecurity   Worried About Programme researcher, broadcasting/film/video in the Last Year: Never true   Ran Out of Food in the Last Year: Never true  Transportation Needs: No Transportation Needs   Lack of Transportation (Medical): No   Lack of Transportation (Non-Medical): No  Physical Activity: Unknown   Days of Exercise per Week: 0 days   Minutes of Exercise per Session: Not on file  Stress: Stress Concern Present   Feeling of Stress : To some extent  Social Connections: Unknown   Frequency of Communication with  Friends and Family: More than three times a week   Frequency of Social Gatherings with Friends and Family: More than three times a week   Attends Religious Services: Not on file   Active Member of Clubs or Organizations: Not on file   Attends Banker Meetings: Not on file   Marital Status: Not on file  Intimate Partner Violence: Not At Risk   Fear of Current or Ex-Partner: No   Emotionally Abused: No   Physically Abused: No   Sexually Abused: No    Past Surgical History:  Procedure Laterality Date   CESAREAN SECTION N/A 10/08/2013   Procedure: CESAREAN SECTION;  Surgeon: Antionette Char, MD;  Location: [facility];  Service: Obstetrics;  Laterality: N/A;   CESAREAN SECTION N/A 07/10/2021   Procedure: CESAREAN SECTION;  Surgeon: [name], MD;  Location: [facility] LD [facility];  Service: Obstetrics;  Laterality: N/A;    Family History  Problem Relation Age of Onset   Diabetes Mother    Arthritis Mother    Hypertension Mother    Diabetes Maternal Grandmother    Hypertension Maternal Grandmother    Cancer Neg Hx    Heart disease Neg Hx     Allergies  Allergen Reactions   Hydralazine Itching   Lasix [Furosemide] Swelling    Mouth and right side of face with oral furosemide. Pt can tolerate IV form     Current Outpatient Medications on File Prior to Visit  Medication Sig Dispense Refill   acetaminophen (TYLENOL) 325 MG tablet Take 2 tablets (650 mg total) by mouth every 4 (four) hours as needed for mild pain (for pain scale < 4). 60 tablet 0   ferrous sulfate (FERROUSUL) 325 (65 FE) MG tablet Take 1 tablet (325 mg total) by mouth every other day. 30 tablet 3   glycopyrrolate (ROBINUL) 1 MG tablet Take 2 tablets (2 mg total) by mouth 3 (three) times daily as needed (spitting). 60 tablet 1   ibuprofen (ADVIL) 600 MG tablet Take 1 tablet (600 mg total) by mouth every 6 (six) hours. 30 tablet 0   NIFEdipine (ADALAT CC) 30 MG 24 hr tablet Take 1 tablet (30 mg total) by mouth daily. 30 tablet 1   pantoprazole (PROTONIX) 20 MG tablet Take 1 tablet (20 mg total) by mouth daily. 30 tablet 1   Prenat-Fe Poly-Methfol-FA-DHA (VITAFOL ULTRA) 29-0.6-0.4-200 MG CAPS Take 1 capsule by mouth daily before breakfast. 90 capsule 4   [DISCONTINUED] hydrochlorothiazide (HYDRODIURIL) 12.5 MG tablet TAKE 1 TABLET BY MOUTH EVERY DAY FOR BLOOD PRESSURE (Patient not taking: No sig reported) 90 tablet 0   No current facility-administered medications on file prior to visit.    BP 126/82   Pulse 100   Temp 98.1 F (36.7 C) (Temporal)   Ht 4\' 11"  (1.499 m)   Wt 248 lb (112.5 kg)   SpO2 98%    BMI 50.09 kg/m  Objective:   Physical Exam Cardiovascular:     Rate and Rhythm: Normal rate and regular rhythm.  Pulmonary:     Effort: Pulmonary effort is normal.     Breath sounds: Normal breath sounds.  Musculoskeletal:     Cervical back: Neck supple.  Skin:    General: Skin is warm and dry.  Psychiatric:        Mood and Affect: Mood normal.          Assessment & Plan:      This visit occurred during the SARS-CoV-2 public health emergency.  Safety protocols were in place, including screening questions prior to the visit, additional usage of staff PPE, and extensive cleaning of exam room while observing appropriate contact time as indicated for disinfecting solutions.

## 2021-09-01 NOTE — Patient Instructions (Signed)
It was a pleasure to see you today!   

## 2021-09-01 NOTE — Assessment & Plan Note (Signed)
A1C today of 5.1 which is within normal range.  We did discuss her high risk for type 2 diabetes given her obesity and family history.  Recommended weight loss through diet and exercise.  Will monitor and repeat A1C in 6 months.

## 2021-09-01 NOTE — Assessment & Plan Note (Signed)
Doing well on pantoprazole 20 mg, continue same.  

## 2021-09-03 ENCOUNTER — Other Ambulatory Visit: Payer: Self-pay | Admitting: Family Medicine

## 2021-09-09 ENCOUNTER — Ambulatory Visit (INDEPENDENT_AMBULATORY_CARE_PROVIDER_SITE_OTHER): Payer: BC Managed Care – PPO | Admitting: Cardiology

## 2021-09-09 ENCOUNTER — Other Ambulatory Visit: Payer: Self-pay

## 2021-09-09 ENCOUNTER — Encounter: Payer: Self-pay | Admitting: Cardiology

## 2021-09-09 VITALS — BP 140/80 | HR 103 | Ht 59.0 in | Wt 259.9 lb

## 2021-09-09 DIAGNOSIS — Z6841 Body Mass Index (BMI) 40.0 and over, adult: Secondary | ICD-10-CM

## 2021-09-09 DIAGNOSIS — I5032 Chronic diastolic (congestive) heart failure: Secondary | ICD-10-CM | POA: Diagnosis not present

## 2021-09-09 DIAGNOSIS — I10 Essential (primary) hypertension: Secondary | ICD-10-CM | POA: Diagnosis not present

## 2021-09-09 NOTE — Patient Instructions (Signed)
Medication Instructions:  Your physician recommends that you continue on your current medications as directed. Please refer to the Current Medication list given to you today.  *If you need a refill on your cardiac medications before your next appointment, please call your pharmacy*  Lab Work: NONE ordered at this time of appointment   If you have labs (blood work) drawn today and your tests are completely normal, you will receive your results only by: MyChart Message (if you have MyChart) OR A paper copy in the mail If you have any lab test that is abnormal or we need to change your treatment, we will call you to review the results.  Testing/Procedures: NONE ordered at this time of appointment   Follow-Up: At [facility], you and your health needs are our priority.  As part of our continuing mission to provide you with exceptional heart care, we have created designated Provider Care Teams.  These Care Teams include your primary Cardiologist (physician) and Advanced Practice Providers (APPs -  Physician Assistants and Nurse Practitioners) who all work together to provide you with the care you need, when you need it.  Your next appointment:   12 week(s)  The format for your next appointment:   In Person  Provider:   Thomasene Ripple, DO  [address] [address] [place], [place] [address] [phone]   Other Instructions

## 2021-09-09 NOTE — Progress Notes (Signed)
Cardio-Obstetrics Clinic  Follow Up Note   Date:  09/09/2021   ID:  Suzanne SCHLOTZHAUER, DOB [birth year], MRN [id number]  PCP:  Doreene Nest, NP   [facility] Providers Cardiologist:  Thomasene Ripple, DO  Electrophysiologist:  None        Referring MD: Doreene Nest, NP   Chief Complaint: " I am doing well"  History of Present Illness:    Suzanne [name] is a 35 y.o. female [G3P2012] who returns for follow up of   I initially saw the patient on Feb 11, 2021 at that time she was referred to the cardio obstetrics clinic given her history of heart failure with preserved ejection fraction and hypertension.  At that visit she had been off hypertensive medications and her blood pressure was acceptable.  No medication was started.  She did have bilateral leg edema which was suspected to be dependent edema from her work.  We ordered a repeat echocardiogram.   I saw the patient on June 01, 2021 at that time she has had her echocardiogram which showed normal ejection fraction.   I saw the patient July 22, 2021 at that time    I saw the patient she has delivered via C-section.  She recently saw her OB/GYN and it was noted that her C-section incision is intact.  She denies any chest pain or shortness of breath.  She responded well to the torsemide that was started at discharge for 5 days postpartum.    Since I last saw the patient she tells me she has been doing well.  She has a lot of family help with the babies.  She is considering going back to work she has some questions about this.  I also asked her to ask her OB/GYN team once they clear her I am fine with that.  I screen the patient and talk to her understanding if she is unaware of postpartum depression at this time she is not.      Prior CV Studies Reviewed: The following studies were reviewed today:   Past Medical History:  Diagnosis Date   Acid reflux    Blood transfusion without reported diagnosis    History of  C-section 02/02/2021   For fetal intolerance of labor C/S x1 Desires VBAC   Hypertension    Morbid obesity (HCC) 02/12/2021   Postpartum care following cesarean delivery 08/10/2021   Sickle cell trait (HCC)     Past Surgical History:  Procedure Laterality Date   CESAREAN SECTION N/A 10/08/2013   Procedure: CESAREAN SECTION;  Surgeon: Antionette Char, MD;  Location: [facility];  Service: Obstetrics;  Laterality: N/A;   CESAREAN SECTION N/A 07/10/2021   Procedure: CESAREAN SECTION;  Surgeon: [name], MD;  Location: [facility];  Service: Obstetrics;  Laterality: N/A;      OB History     Gravida  3   Para  2   Term  2   Preterm      AB  1   Living  2      SAB      IAB  1   Ectopic      Multiple  0   Live Births  2               Current Medications: Current Meds  Medication Sig   acetaminophen (TYLENOL) 325 MG tablet Take 2 tablets (650 mg total) by mouth every 4 (four) hours as needed for mild pain (for pain  scale < 4).   ferrous sulfate (FERROUSUL) 325 (65 FE) MG tablet Take 1 tablet (325 mg total) by mouth every other day.   glycopyrrolate (ROBINUL) 1 MG tablet Take 2 tablets (2 mg total) by mouth 3 (three) times daily as needed (spitting).   ibuprofen (ADVIL) 600 MG tablet Take 1 tablet (600 mg total) by mouth every 6 (six) hours.   NIFEdipine (ADALAT CC) 30 MG 24 hr tablet Take 1 tablet (30 mg total) by mouth daily.   pantoprazole (PROTONIX) 20 MG tablet Take 1 tablet (20 mg total) by mouth daily.   Prenat-Fe Poly-Methfol-FA-DHA (VITAFOL ULTRA) 29-0.6-0.4-200 MG CAPS Take 1 capsule by mouth daily before breakfast.     Allergies:   Hydralazine and Lasix [furosemide]   Social History   Socioeconomic History   Marital status: Single    Spouse name: Not on file   Number of children: Not on file   Years of education: Not on file   Highest education level: Not on file  Occupational History   Not on file  Tobacco Use   Smoking status: Never    Smokeless tobacco: Never  Vaping Use   Vaping Use: Never used  Substance and Sexual Activity   Alcohol use: No   Drug use: No   Sexual activity: Not Currently    Partners: Male    Birth control/protection: None  Other Topics Concern   Not on file  Social History Narrative   Completed some college   Works at [employer]   From [place]   Has one son, 7 years.   Enjoys playing with her son, sleeping.   Family is established here as well.   Social Determinants of Health   Financial Resource Strain: Low Risk    Difficulty of Paying Living Expenses: Not hard at all  Food Insecurity: No Food Insecurity   Worried About Programme researcher, broadcasting/film/video in the Last Year: Never true   Ran Out of Food in the Last Year: Never true  Transportation Needs: No Transportation Needs   Lack of Transportation (Medical): No   Lack of Transportation (Non-Medical): No  Physical Activity: Unknown   Days of Exercise per Week: 0 days   Minutes of Exercise per Session: Not on file  Stress: Stress Concern Present   Feeling of Stress : To some extent  Social Connections: Unknown   Frequency of Communication with Friends and Family: More than three times a week   Frequency of Social Gatherings with Friends and Family: More than three times a week   Attends Religious Services: Not on Scientist, clinical (histocompatibility and immunogenetics) or Organizations: Not on file   Attends Banker Meetings: Not on file   Marital Status: Not on file      Family History  Problem Relation Age of Onset   Diabetes Mother    Arthritis Mother    Hypertension Mother    Diabetes Maternal Grandmother    Hypertension Maternal Grandmother    Cancer Neg Hx    Heart disease Neg Hx       ROS:   Please see the history of present illness.     All other systems reviewed and are negative.   Labs/EKG Reviewed:    EKG:   EKG is was not ordered today.    Recent Labs: 07/10/2021: Magnesium 8.3 07/12/2021: ALT 15; BUN 20; Creatinine, Ser  0.96; Potassium 3.7; Sodium 139 07/13/2021: Hemoglobin 7.3; Platelets 361   Recent Lipid Panel Lab Results  Component Value Date/Time   CHOL 155 10/23/2018 10:04 AM   TRIG 87.0 10/23/2018 10:04 AM   HDL 34.90 (L) 10/23/2018 10:04 AM   CHOLHDL 4 10/23/2018 10:04 AM   LDLCALC 103 (H) 10/23/2018 10:04 AM    Physical Exam:    VS:  BP 140/80 (BP Location: Left Arm)    Pulse (!) 103    Ht 4\' 11"  (1.499 m)    Wt 259 lb 14.4 oz (117.9 kg)    SpO2 98%    BMI 52.49 kg/m     Wt Readings from Last 3 Encounters:  09/09/21 259 lb 14.4 oz (117.9 kg)  09/01/21 248 lb (112.5 kg)  08/10/21 253 lb (114.8 kg)     GEN:  Well nourished, well developed in no acute distress HEENT: Normal NECK: No JVD; No carotid bruits LYMPHATICS: No lymphadenopathy CARDIAC: RRR, no murmurs, rubs, gallops RESPIRATORY:  Clear to auscultation without rales, wheezing or rhonchi  ABDOMEN: Soft, non-tender, non-distended MUSCULOSKELETAL:  No edema; No deformity  SKIN: Warm and dry NEUROLOGIC:  Alert and oriented x 3 PSYCHIATRIC:  Normal affect    Risk Assessment/Risk Calculators:                 ASSESSMENT & PLAN:    Chronic hypertension Chronic diastolic heart failure Obesity   Her blood pressure is slightly elevated today.  The patient tells me that she had not taking her antihypertensive medication today.  She assures me that she does not normally do this but today she had a run out of the house.  I have asked her to take her blood pressure daily for me and send me this information in 1 week.  Her goal blood pressure is less than 130/80 mmHg.  She still need close postpartum follow-up I will see the patient in 12 weeks [place] office.   Patient Instructions  Medication Instructions:  Your physician recommends that you continue on your current medications as directed. Please refer to the Current Medication list given to you today.  *If you need a refill on your cardiac medications before your  next appointment, please call your pharmacy*  Lab Work: NONE ordered at this time of appointment   If you have labs (blood work) drawn today and your tests are completely normal, you will receive your results only by: MyChart Message (if you have MyChart) OR A paper copy in the mail If you have any lab test that is abnormal or we need to change your treatment, we will call you to review the results.  Testing/Procedures: NONE ordered at this time of appointment   Follow-Up: At [facility], you and your health needs are our priority.  As part of our continuing mission to provide you with exceptional heart care, we have created designated Provider Care Teams.  These Care Teams include your primary Cardiologist (physician) and Advanced Practice Providers (APPs -  Physician Assistants and Nurse Practitioners) who all work together to provide you with the care you need, when you need it.  Your next appointment:   12 week(s)  The format for your next appointment:   In Person  Provider:   [facility], DO  [address] [place], [place] [phone]   Other Instructions    Dispo:  Return in about 12 weeks (around 12/02/2021).   Medication Adjustments/Labs and Tests Ordered: Current medicines are reviewed at length with the patient today.  Concerns regarding medicines are outlined above.  Tests Ordered: No orders of  the defined types were placed in this encounter.  Medication Changes: No orders of the defined types were placed in this encounter.

## 2021-09-14 ENCOUNTER — Other Ambulatory Visit: Payer: Self-pay

## 2021-09-14 ENCOUNTER — Ambulatory Visit: Payer: BC Managed Care – PPO | Admitting: Obstetrics and Gynecology

## 2021-09-16 DIAGNOSIS — Z309 Encounter for contraceptive management, unspecified: Secondary | ICD-10-CM | POA: Insufficient documentation

## 2021-09-20 ENCOUNTER — Ambulatory Visit: Payer: BC Managed Care – PPO | Admitting: Cardiology

## 2021-09-20 ENCOUNTER — Encounter: Payer: Self-pay | Admitting: Cardiology

## 2021-09-21 ENCOUNTER — Other Ambulatory Visit (HOSPITAL_COMMUNITY): Payer: Self-pay

## 2021-09-21 ENCOUNTER — Telehealth: Payer: Self-pay

## 2021-09-21 NOTE — Telephone Encounter (Signed)
Left message to call ASAP to schedule an appt today before 11 am

## 2021-09-21 NOTE — Telephone Encounter (Signed)
Tried to call pt x2 to schedule an appt with Dr Servando Salina today before 11 am

## 2021-09-21 NOTE — Telephone Encounter (Signed)
Called pt to set up an appot with Dr. Servando Salina today. No answer, left message for her to return the call.

## 2021-09-21 NOTE — Telephone Encounter (Signed)
Called pt, she is not able to get the kids ready before 11am and she states that she is feeling better today so I made her appt at 8am tomorrow.

## 2021-09-22 ENCOUNTER — Other Ambulatory Visit: Payer: Self-pay

## 2021-09-22 ENCOUNTER — Ambulatory Visit (INDEPENDENT_AMBULATORY_CARE_PROVIDER_SITE_OTHER): Payer: BC Managed Care – PPO | Admitting: Cardiology

## 2021-09-22 ENCOUNTER — Encounter: Payer: Self-pay | Admitting: Cardiology

## 2021-09-22 VITALS — BP 136/78 | HR 98 | Ht 59.0 in | Wt 247.8 lb

## 2021-09-22 DIAGNOSIS — I10 Essential (primary) hypertension: Secondary | ICD-10-CM

## 2021-09-22 DIAGNOSIS — I5032 Chronic diastolic (congestive) heart failure: Secondary | ICD-10-CM | POA: Diagnosis not present

## 2021-09-22 DIAGNOSIS — Z79899 Other long term (current) drug therapy: Secondary | ICD-10-CM | POA: Insufficient documentation

## 2021-09-22 LAB — BASIC METABOLIC PANEL
BUN/Creatinine Ratio: 15 (ref 9–23)
BUN: 11 mg/dL (ref 6–20)
CO2: 26 mmol/L (ref 20–29)
Calcium: 9.8 mg/dL (ref 8.7–10.2)
Chloride: 102 mmol/L (ref 96–106)
Creatinine, Ser: 0.74 mg/dL (ref 0.57–1.00)
Glucose: 107 mg/dL — ABNORMAL HIGH (ref 70–99)
Potassium: 4.1 mmol/L (ref 3.5–5.2)
Sodium: 139 mmol/L (ref 134–144)
eGFR: 109 mL/min/{1.73_m2} (ref >59)

## 2021-09-22 LAB — MAGNESIUM: Magnesium: 2.3 mg/dL (ref 1.6–2.3)

## 2021-09-22 MED ORDER — POTASSIUM CHLORIDE ER 10 MEQ PO TBCR: 10.0000 meq | EXTENDED_RELEASE_TABLET | Freq: Every day | ORAL | 3 refills | Status: DC

## 2021-09-22 MED ORDER — TORSEMIDE 20 MG PO TABS: 20.0000 mg | ORAL_TABLET | Freq: Every day | ORAL | 0 refills | Status: DC

## 2021-09-22 MED ORDER — POTASSIUM CHLORIDE ER 10 MEQ PO TBCR: 10.0000 meq | EXTENDED_RELEASE_TABLET | Freq: Every day | ORAL | 0 refills | Status: DC

## 2021-09-22 MED ORDER — TORSEMIDE 20 MG PO TABS: 20.0000 mg | ORAL_TABLET | Freq: Two times a day (BID) | ORAL | 3 refills | Status: DC

## 2021-09-22 NOTE — Progress Notes (Signed)
Cardio-Obstetrics Clinic  Follow Up Note   Date:  09/22/2021   ID:  Suzanne [name], DOB June 16, 1986, MRN [id number]  PCP:  Doreene Nest, NP   [facility] Providers Cardiologist:  Thomasene Ripple, DO  Electrophysiologist:  None        Referring MD: Doreene Nest, NP   Chief Complaint: " I had episodes of shortness of breath"  History of Present Illness:    Suzanne [name] is a 35 y.o. female [G3P2012] who returns for follow up of shortness of breath.  I initially saw the patient on Feb 11, 2021 at that time she was referred to the cardio obstetrics clinic given her history of heart failure with preserved ejection fraction and hypertension.  At that visit she had been off hypertensive medications and her blood pressure was acceptable.  No medication was started.  She did have bilateral leg edema which was suspected to be dependent edema from her work.  We ordered a repeat echocardiogram.   I saw the patient on June 01, 2021 at that time she has had her echocardiogram which showed normal ejection fraction.   I saw the patient July 22, 2021 at that time    I saw the patient she has delivered via C-section.  She recently saw her OB/GYN and it was noted that her C-section incision is intact.  She denies any chest pain or shortness of breath.  She responded well to the torsemide that was started at discharge for 5 days postpartum.   She was seen on September 09, 2021 at that time her blood pressure slightly elevated and she had not been taking her medications.  The patient did call me tell me that she had experienced some shortness of breath.  She admitted that she had missed her medications.  But did not take her blood pressure at the time of this occurrence.  She was unable to lie flat.  She was experiencing shortness of breath for the past few days.   Prior CV Studies Reviewed: The following studies were reviewed today:  TTE 02/2021 IMPRESSIONS     1. Left  ventricular ejection fraction, by estimation, is 60 to 65%. The left ventricle has normal function. The left ventricle has no regional wall motion abnormalities. Left ventricular diastolic parameters were normal. The average left ventricular  global longitudinal strain is -23.7 %. The global longitudinal strain is normal.   2. Right ventricular systolic function is normal. The right ventricular size is normal. Tricuspid regurgitation signal is inadequate for assessing PA pressure.   3. The mitral valve is normal in structure. No evidence of mitral valve regurgitation. No evidence of mitral stenosis.   4. The aortic valve is normal in structure. Aortic valve regurgitation is not visualized. No aortic stenosis is present.   5. The inferior vena cava is normal in size with greater than 50% respiratory variability, suggesting right atrial pressure of 3 mmHg.   FINDINGS   Left Ventricle: Left ventricular ejection fraction, by estimation, is 60 to 65%. The left ventricle has normal function. The left ventricle has no regional wall motion abnormalities. The average left ventricular global longitudinal strain is -23.7 %.  The global longitudinal strain is normal. The left ventricular internal cavity size was normal in size. There is no left ventricular hypertrophy. Left ventricular diastolic parameters were normal. Normal left ventricular filling pressure.   Right Ventricle: The right ventricular size is normal. No increase in right ventricular wall thickness. Right ventricular systolic function  is normal. Tricuspid regurgitation signal is inadequate for assessing PA pressure.   Left Atrium: Left atrial size was normal in size.   Right Atrium: Right atrial size was normal in size.   Pericardium: There is no evidence of pericardial effusion.   Mitral Valve: The mitral valve is normal in structure. No evidence of mitral valve regurgitation. No evidence of mitral valve stenosis.   Tricuspid Valve: The  tricuspid valve is normal in structure. Tricuspid  valve regurgitation is not demonstrated. No evidence of tricuspid  stenosis.   Aortic Valve: The aortic valve is normal in structure. Aortic valve regurgitation is not visualized. No aortic stenosis is present.   Pulmonic Valve: The pulmonic valve was normal in structure. Pulmonic valve regurgitation is trivial. No evidence of pulmonic stenosis.   Aorta: The aortic root is normal in size and structure.   Venous: The inferior vena cava is normal in size with greater than 50% respiratory variability, suggesting right atrial pressure of 3 mmHg.   IAS/Shunts: No atrial level shunt detected by color flow Doppler.   Past Medical History:  Diagnosis Date   Acid reflux    Blood transfusion without reported diagnosis    History of C-section 02/02/2021   For fetal intolerance of labor C/S x1 Desires VBAC   Hypertension    Morbid obesity (HCC) 02/12/2021   Postpartum care following cesarean delivery 08/10/2021   Sickle cell trait [facility])     Past Surgical History:  Procedure Laterality Date   CESAREAN SECTION N/A 10/08/2013   Procedure: CESAREAN SECTION;  Surgeon: Antionette Char, MD;  Location: [facility] ORS;  Service: Obstetrics;  Laterality: N/A;   CESAREAN SECTION N/A 07/10/2021   Procedure: CESAREAN SECTION;  Surgeon: [name], MD;  Location: [facility] LD ORS;  Service: Obstetrics;  Laterality: N/A;      OB History     Gravida  3   Para  2   Term  2   Preterm      AB  1   Living  2      SAB      IAB  1   Ectopic      Multiple  0   Live Births  2               Current Medications: Current Meds  Medication Sig   acetaminophen (TYLENOL) 325 MG tablet Take 2 tablets (650 mg total) by mouth every 4 (four) hours as needed for mild pain (for pain scale < 4).   ferrous sulfate (FERROUSUL) 325 (65 FE) MG tablet Take 1 tablet (325 mg total) by mouth every other day.   glycopyrrolate (ROBINUL) 1 MG tablet Take 2 tablets (2  mg total) by mouth 3 (three) times daily as needed (spitting).   ibuprofen (ADVIL) 600 MG tablet Take 1 tablet (600 mg total) by mouth every 6 (six) hours.   NIFEdipine (ADALAT CC) 30 MG 24 hr tablet Take 1 tablet (30 mg total) by mouth daily.   pantoprazole (PROTONIX) 20 MG tablet Take 1 tablet (20 mg total) by mouth daily.   potassium chloride (KLOR-CON) 10 MEQ tablet Take 1 tablet (10 mEq total) by mouth daily.   Prenat-Fe Poly-Methfol-FA-DHA (VITAFOL ULTRA) 29-0.6-0.4-200 MG CAPS Take 1 capsule by mouth daily before breakfast.   torsemide (DEMADEX) 20 MG tablet Take 1 tablet (20 mg total) by mouth 2 (two) times daily.     Allergies:   Hydralazine and Lasix [furosemide]   Social History   Socioeconomic History  Marital status: Single    Spouse name: Not on file   Number of children: Not on file   Years of education: Not on file   Highest education level: Not on file  Occupational History   Not on file  Tobacco Use   Smoking status: Never   Smokeless tobacco: Never  Vaping Use   Vaping Use: Never used  Substance and Sexual Activity   Alcohol use: No   Drug use: No   Sexual activity: Not Currently    Partners: Male    Birth control/protection: None  Other Topics Concern   Not on file  Social History Narrative   Completed some college   Works at [employer]   From [place]   Has one son, 7 years.   Enjoys playing with her son, sleeping.   Family is established here as well.   Social Determinants of Health   Financial Resource Strain: Low Risk    Difficulty of Paying Living Expenses: Not hard at all  Food Insecurity: No Food Insecurity   Worried About Programme researcher, broadcasting/film/video in the Last Year: Never true   Ran Out of Food in the Last Year: Never true  Transportation Needs: No Transportation Needs   Lack of Transportation (Medical): No   Lack of Transportation (Non-Medical): No  Physical Activity: Unknown   Days of Exercise per Week: 0 days   Minutes of Exercise per  Session: Not on file  Stress: Stress Concern Present   Feeling of Stress : To some extent  Social Connections: Unknown   Frequency of Communication with Friends and Family: More than three times a week   Frequency of Social Gatherings with Friends and Family: More than three times a week   Attends Religious Services: Not on Scientist, clinical (histocompatibility and immunogenetics) or Organizations: Not on file   Attends Banker Meetings: Not on file   Marital Status: Not on file      Family History  Problem Relation Age of Onset   Diabetes Mother    Arthritis Mother    Hypertension Mother    Diabetes Maternal Grandmother    Hypertension Maternal Grandmother    Cancer Neg Hx    Heart disease Neg Hx       ROS:   Review of Systems  Constitution: Negative for decreased appetite, fever and weight gain.  HENT: Negative for congestion, ear discharge, hoarse voice and sore throat.   Eyes: Negative for discharge, redness, vision loss in right eye and visual halos.  Cardiovascular: Negative for chest pain, dyspnea on exertion, leg swelling, orthopnea and palpitations.  Respiratory: Reports shortness of breath.  Negative for cough, hemoptysis, and snoring.   Endocrine: Negative for heat intolerance and polyphagia.  Hematologic/Lymphatic: Negative for bleeding problem. Does not bruise/bleed easily.  Skin: Negative for flushing, nail changes, rash and suspicious lesions.  Musculoskeletal: Negative for arthritis, joint pain, muscle cramps, myalgias, neck pain and stiffness.  Gastrointestinal: Negative for abdominal pain, bowel incontinence, diarrhea and excessive appetite.  Genitourinary: Negative for decreased libido, genital sores and incomplete emptying.  Neurological: Negative for brief paralysis, focal weakness, headaches and loss of balance.  Psychiatric/Behavioral: Negative for altered mental status, depression and suicidal ideas.  Allergic/Immunologic: Negative for HIV exposure and persistent  infections.    Labs/EKG Reviewed:    EKG:   EKG is was not ordered today.    Recent Labs: 07/10/2021: Magnesium 8.3 07/12/2021: ALT 15; BUN 20; Creatinine, Ser 0.96; Potassium 3.7; Sodium 139 07/13/2021:  Hemoglobin 7.3; Platelets 361   Recent Lipid Panel Lab Results  Component Value Date/Time   CHOL 155 10/23/2018 10:04 AM   TRIG 87.0 10/23/2018 10:04 AM   HDL 34.90 (L) 10/23/2018 10:04 AM   CHOLHDL 4 10/23/2018 10:04 AM   LDLCALC 103 (H) 10/23/2018 10:04 AM    Physical Exam:    VS:  BP 136/78    Pulse 98    Ht 4\' 11"  (1.499 m)    Wt 247 lb 12.8 oz (112.4 kg)    SpO2 99%    BMI 50.05 kg/m     Wt Readings from Last 3 Encounters:  09/22/21 247 lb 12.8 oz (112.4 kg)  09/09/21 259 lb 14.4 oz (117.9 kg)  09/01/21 248 lb (112.5 kg)     GEN:  Well nourished, well developed in no acute distress HEENT: Normal NECK: No JVD; No carotid bruits LYMPHATICS: No lymphadenopathy CARDIAC: RRR, no murmurs, rubs, gallops RESPIRATORY:  Clear to auscultation without rales, wheezing or rhonchi  ABDOMEN: Soft, non-tender, non-distended MUSCULOSKELETAL:  No edema; No deformity  SKIN: Warm and dry NEUROLOGIC:  Alert and oriented x 3 PSYCHIATRIC:  Normal affect    Risk Assessment/Risk Calculators:                 ASSESSMENT & PLAN:    Shortness of breath Chronic diastolic heart failure Chronic hypertension Morbid obesity  She is experiencing transient shortness of breath and a few doses of Mestinon antihypertensive medication. Concerned that she may have had some transient pulmonary edema clinically the patient is short course of diuretic torsemide 20 mg daily for the next 5 days with potassium supplement.  We will get blood work today to assess kidney function and electrolytes.  Her blood pressure today is slightly above target I spoke to the patient importance of staying on her antihypertensive medication given her chronic hypertension.  I also advised her that with lactation  medication if her blood pressure increases there is high chances for flash pulmonary edema and subsequent.  She expresses understanding.  The patient understands the need to lose weight with diet and exercise. We have discussed specific strategies for this.  Patient Instructions  Medication Instructions:  Your physician has recommended you make the following change in your medication:  START: Torsemide 20 mg once daily START: Potassium 10 mEq once daily *If you need a refill on your cardiac medications before your next appointment, please call your pharmacy*   Lab Work: Your physician recommends that you return for lab work in:  TODAY: BMET, Mag If you have labs (blood work) drawn today and your tests are completely normal, you will receive your results only by: MyChart Message (if you have MyChart) OR A paper copy in the mail If you have any lab test that is abnormal or we need to change your treatment, we will call you to review the results.   Testing/Procedures: None   Follow-Up: At [facility], you and your health needs are our priority.  As part of our continuing mission to provide you with exceptional heart care, we have created designated Provider Care Teams.  These Care Teams include your primary Cardiologist (physician) and Advanced Practice Providers (APPs -  Physician Assistants and Nurse Practitioners) who all work together to provide you with the care you need, when you need it.  We recommend signing up for the patient portal called "MyChart".  Sign up information is provided on this After Visit Summary.  MyChart is used to connect with  patients for Virtual Visits (Telemedicine).  Patients are able to view lab/test results, encounter notes, upcoming appointments, etc.  Non-urgent messages can be sent to your provider as well.   To learn more about what you can do with MyChart, go to [email or web address].    Your next appointment:   Keep 12/07/2021  The format for  your next appointment:   In Person  Provider:   Thomasene Ripple, DO     Other Instructions     Dispo:  No follow-ups on file.   Medication Adjustments/Labs and Tests Ordered: Current medicines are reviewed at length with the patient today.  Concerns regarding medicines are outlined above.  Tests Ordered: Orders Placed This Encounter  Procedures   Basic Metabolic Panel (BMET)   Magnesium   Medication Changes: Meds ordered this encounter  Medications   torsemide (DEMADEX) 20 MG tablet    Sig: Take 1 tablet (20 mg total) by mouth 2 (two) times daily.    Dispense:  180 tablet    Refill:  3   potassium chloride (KLOR-CON) 10 MEQ tablet    Sig: Take 1 tablet (10 mEq total) by mouth daily.    Dispense:  90 tablet    Refill:  3

## 2021-09-22 NOTE — Patient Instructions (Addendum)
Medication Instructions:  Your physician has recommended you make the following change in your medication:  START: Torsemide 20 mg once daily START: Potassium 10 mEq once daily *If you need a refill on your cardiac medications before your next appointment, please call your pharmacy*   Lab Work: Your physician recommends that you return for lab work in:  TODAY: BMET, Mag If you have labs (blood work) drawn today and your tests are completely normal, you will receive your results only by: MyChart Message (if you have MyChart) OR A paper copy in the mail If you have any lab test that is abnormal or we need to change your treatment, we will call you to review the results.   Testing/Procedures: None   Follow-Up: At [facility], you and your health needs are our priority.  As part of our continuing mission to provide you with exceptional heart care, we have created designated Provider Care Teams.  These Care Teams include your primary Cardiologist (physician) and Advanced Practice Providers (APPs -  Physician Assistants and Nurse Practitioners) who all work together to provide you with the care you need, when you need it.  We recommend signing up for the patient portal called "MyChart".  Sign up information is provided on this After Visit Summary.  MyChart is used to connect with patients for Virtual Visits (Telemedicine).  Patients are able to view lab/test results, encounter notes, upcoming appointments, etc.  Non-urgent messages can be sent to your provider as well.   To learn more about what you can do with MyChart, go to [email or web address].    Your next appointment:   Keep 12/07/2021  The format for your next appointment:   In Person  Provider:   Thomasene Ripple, DO     Other Instructions

## 2021-09-28 ENCOUNTER — Other Ambulatory Visit: Payer: Self-pay | Admitting: Obstetrics and Gynecology

## 2021-09-28 DIAGNOSIS — K219 Gastro-esophageal reflux disease without esophagitis: Secondary | ICD-10-CM

## 2021-10-26 ENCOUNTER — Other Ambulatory Visit: Payer: Self-pay | Admitting: Obstetrics and Gynecology

## 2021-11-19 ENCOUNTER — Other Ambulatory Visit: Payer: Self-pay | Admitting: Obstetrics and Gynecology

## 2021-11-23 DIAGNOSIS — I10 Essential (primary) hypertension: Secondary | ICD-10-CM

## 2021-11-23 DIAGNOSIS — K219 Gastro-esophageal reflux disease without esophagitis: Secondary | ICD-10-CM

## 2021-11-23 MED ORDER — PANTOPRAZOLE SODIUM 20 MG PO TBEC: 20.0000 mg | DELAYED_RELEASE_TABLET | Freq: Every day | ORAL | 2 refills | Status: DC

## 2021-11-23 MED ORDER — NIFEDIPINE ER 30 MG PO TB24: 30.0000 mg | ORAL_TABLET | Freq: Every day | ORAL | 2 refills | Status: DC

## 2021-11-23 NOTE — Telephone Encounter (Signed)
Refill requests were going to patient's gynecologist and not [place] as she was the last one to refill these.  ?Pulled these down for review now. Thank you ?

## 2021-12-07 ENCOUNTER — Other Ambulatory Visit: Payer: Self-pay

## 2021-12-07 ENCOUNTER — Ambulatory Visit (INDEPENDENT_AMBULATORY_CARE_PROVIDER_SITE_OTHER): Payer: BC Managed Care – PPO | Admitting: Cardiology

## 2021-12-07 ENCOUNTER — Encounter: Payer: Self-pay | Admitting: Cardiology

## 2021-12-07 VITALS — BP 122/86 | HR 91 | Ht 59.0 in | Wt 256.4 lb

## 2021-12-07 DIAGNOSIS — Z79899 Other long term (current) drug therapy: Secondary | ICD-10-CM

## 2021-12-07 DIAGNOSIS — I5032 Chronic diastolic (congestive) heart failure: Secondary | ICD-10-CM | POA: Diagnosis not present

## 2021-12-07 DIAGNOSIS — Z6841 Body Mass Index (BMI) 40.0 and over, adult: Secondary | ICD-10-CM

## 2021-12-07 DIAGNOSIS — I10 Essential (primary) hypertension: Secondary | ICD-10-CM | POA: Diagnosis not present

## 2021-12-07 LAB — CBC WITH DIFFERENTIAL/PLATELET
Basophils Absolute: 0 10*3/uL (ref 0.0–0.2)
Basos: 0 %
EOS (ABSOLUTE): 0.2 10*3/uL (ref 0.0–0.4)
Eos: 2 %
Hematocrit: 35.1 % (ref 34.0–46.6)
Hemoglobin: 10.7 g/dL — ABNORMAL LOW (ref 11.1–15.9)
Immature Grans (Abs): 0 10*3/uL (ref 0.0–0.1)
Immature Granulocytes: 0 %
Lymphocytes Absolute: 2.7 10*3/uL (ref 0.7–3.1)
Lymphs: 35 %
MCH: 20.1 pg — ABNORMAL LOW (ref 26.6–33.0)
MCHC: 30.5 g/dL — ABNORMAL LOW (ref 31.5–35.7)
MCV: 66 fL — ABNORMAL LOW (ref 79–97)
Monocytes Absolute: 0.6 10*3/uL (ref 0.1–0.9)
Monocytes: 8 %
Neutrophils Absolute: 4.2 10*3/uL (ref 1.4–7.0)
Neutrophils: 55 %
Platelets: 368 10*3/uL (ref 150–450)
RBC: 5.33 x10E6/uL — ABNORMAL HIGH (ref 3.77–5.28)
RDW: 18.6 % — ABNORMAL HIGH (ref 11.7–15.4)
WBC: 7.8 10*3/uL (ref 3.4–10.8)

## 2021-12-07 NOTE — Progress Notes (Signed)
?Cardio-Obstetrics Clinic ? ?Follow Up Note ? ? ?Date:  12/07/2021  ? ?Suzanne [name], DOB 1986-03-03, MRN [id number] ? ?PCP:  Doreene Nest, NP ?  ?[facility] Providers ?Cardiologist:  Thomasene Ripple, DO  ?Electrophysiologist:  None      ? ? ?Referring MD: Doreene Nest, NP  ? ?Chief Complaint: " I am doing fine" ? ?History of Present Illness:   ? ?Suzanne [name] is a 36 y.o. female [G3P2012] who returns for follow up of for postpartum hypertension.  ? ?I initially saw the patient on Feb 11, 2021 at that time she was referred to the cardio obstetrics clinic given her history of heart failure with preserved ejection fraction and hypertension.  At that visit she had been off hypertensive medications and her blood pressure was acceptable.  No medication was started.  She did have bilateral leg edema which was suspected to be dependent edema from her work.  We ordered a repeat echocardiogram. ?  ?I saw the patient on June 01, 2021 at that time she has had her echocardiogram which showed normal ejection fraction. ?  ?I saw the patient July 22, 2021 at that time  ?  ?I saw the patient she has delivered via C-section.  She recently saw her OB/GYN and it was noted that her C-section incision is intact.  She denies any chest pain or shortness of breath.  She responded well to the torsemide that was started at discharge for 5 days postpartum. ?  ?She was seen on September 09, 2021 at that time her blood pressure slightly elevated and she had not been taking her medications.  The patient did call me tell me that she had experienced some shortness of breath.  She admitted that she had missed her medications. ? ?At her visit on September 22, 2021 at that time she was experiencing some shortness of breath leg I gave the patient a short course of torsemide.  She was able to do this and felt much better. ? ?She also complaints at this time. ? ? ?Prior CV Studies Reviewed: ?The following studies were reviewed  today: ? ?TTE 02/2021 IMPRESSIONS  ? 1. Left ventricular ejection fraction, by estimation, is 60 to 65%. The left ventricle has normal function. The left ventricle has no regional wall motion abnormalities. Left ventricular diastolic parameters were normal. The average left ventricular  ?global longitudinal strain is -23.7 %. The global longitudinal strain is normal.  ? 2. Right ventricular systolic function is normal. The right ventricular size is normal. Tricuspid regurgitation signal is inadequate for assessing PA pressure.  ? 3. The mitral valve is normal in structure. No evidence of mitral valve regurgitation. No evidence of mitral stenosis.  ? 4. The aortic valve is normal in structure. Aortic valve regurgitation is not visualized. No aortic stenosis is present.  ? 5. The inferior vena cava is normal in size with greater than 50% respiratory variability, suggesting right atrial pressure of 3 mmHg.  ? ?FINDINGS  ? Left Ventricle: Left ventricular ejection fraction, by estimation, is 60 to 65%. The left ventricle has normal function. The left ventricle has no regional wall motion abnormalities. The average left ventricular global longitudinal strain is -23.7 %.  ?The global longitudinal strain is normal. The left ventricular internal cavity size was normal in size. There is no left ventricular hypertrophy. Left ventricular diastolic parameters were normal. Normal left ventricular filling pressure.  ? ?Right Ventricle: The right ventricular size is normal. No increase  in right ventricular wall thickness. Right ventricular systolic function is normal. Tricuspid regurgitation signal is inadequate for assessing PA pressure.  ? ?Left Atrium: Left atrial size was normal in size.  ? ?Right Atrium: Right atrial size was normal in size.  ? ?Pericardium: There is no evidence of pericardial effusion.  ? ?Mitral Valve: The mitral valve is normal in structure. No evidence of mitral valve regurgitation. No evidence of mitral  valve stenosis.  ? ?Tricuspid Valve: The tricuspid valve is normal in structure. Tricuspid  ?valve regurgitation is not demonstrated. No evidence of tricuspid  ?stenosis.  ? ?Aortic Valve: The aortic valve is normal in structure. Aortic valve regurgitation is not visualized. No aortic stenosis is present.  ? ?Pulmonic Valve: The pulmonic valve was normal in structure. Pulmonic valve regurgitation is trivial. No evidence of pulmonic stenosis.  ? ?Aorta: The aortic root is normal in size and structure.  ? ?Venous: The inferior vena cava is normal in size with greater than 50% respiratory variability, suggesting right atrial pressure of 3 mmHg.  ? ?IAS/Shunts: No atrial level shunt detected by color flow Doppler.  ? ? ?  ? ? ?Past Medical History:  ?Diagnosis Date  ? Acid reflux   ? Blood transfusion without reported diagnosis   ? History of C-section 02/02/2021  ? For fetal intolerance of labor C/S x1 Desires VBAC  ? Hypertension   ? Morbid obesity (HCC) 02/12/2021  ? Postpartum care following cesarean delivery 08/10/2021  ? Sickle cell trait (HCC)   ? ? ?Past Surgical History:  ?Procedure Laterality Date  ? CESAREAN SECTION N/A 10/08/2013  ? Procedure: CESAREAN SECTION;  Surgeon: Antionette Char, MD;  Location: [facility];  Service: Obstetrics;  Laterality: N/A;  ? CESAREAN SECTION N/A 07/10/2021  ? Procedure: CESAREAN SECTION;  Surgeon: [name], MD;  Location: [facility];  Service: Obstetrics;  Laterality: N/A;  ?   ? ?OB History   ? ? Gravida  ?3  ? Para  ?2  ? Term  ?2  ? Preterm  ?   ? AB  ?1  ? Living  ?2  ?  ? ? SAB  ?   ? IAB  ?1  ? Ectopic  ?   ? Multiple  ?0  ? Live Births  ?2  ?   ?  ?  ?    ? ? ?Current Medications: ?Current Meds  ?Medication Sig  ? NIFEdipine (ADALAT CC) 30 MG 24 hr tablet Take 1 tablet (30 mg total) by mouth daily. For blood pressure.  ? pantoprazole (PROTONIX) 20 MG tablet Take 1 tablet (20 mg total) by mouth daily. For heartburn.  ?  ? ?Allergies:   Hydralazine and Lasix [furosemide]   ? ?Social History  ? ?Socioeconomic History  ? Marital status: Single  ?  Spouse name: Not on file  ? Number of children: Not on file  ? Years of education: Not on file  ? Highest education level: Not on file  ?Occupational History  ? Not on file  ?Tobacco Use  ? Smoking status: Never  ? Smokeless tobacco: Never  ?Vaping Use  ? Vaping Use: Never used  ?Substance and Sexual Activity  ? Alcohol use: No  ? Drug use: No  ? Sexual activity: Not Currently  ?  Partners: Male  ?  Birth control/protection: None  ?Other Topics Concern  ? Not on file  ?Social History Narrative  ? Completed some college  ? Works at [employer]  ? From  [place]  ? Has one son, 7 years.  ? Enjoys playing with her son, sleeping.  ? Family is established here as well.  ? ?Social Determinants of Health  ? ?Financial Resource Strain: Low Risk   ? Difficulty of Paying Living Expenses: Not hard at all  ?Food Insecurity: No Food Insecurity  ? Worried About Programme researcher, broadcasting/film/video in the Last Year: Never true  ? Ran Out of Food in the Last Year: Never true  ?Transportation Needs: No Transportation Needs  ? Lack of Transportation (Medical): No  ? Lack of Transportation (Non-Medical): No  ?Physical Activity: Unknown  ? Days of Exercise per Week: 0 days  ? Minutes of Exercise per Session: Not on file  ?Stress: Stress Concern Present  ? Feeling of Stress : To some extent  ?Social Connections: Unknown  ? Frequency of Communication with Friends and Family: More than three times a week  ? Frequency of Social Gatherings with Friends and Family: More than three times a week  ? Attends Religious Services: Not on file  ? Active Member of Clubs or Organizations: Not on file  ? Attends Banker Meetings: Not on file  ? Marital Status: Not on file  ?  ? ? ?Family History  ?Problem Relation Age of Onset  ? Diabetes Mother   ? Arthritis Mother   ? Hypertension Mother   ? Diabetes Maternal Grandmother   ? Hypertension Maternal Grandmother   ? Cancer Neg Hx   ?  Heart disease Neg Hx   ?   ? ?ROS:   ?Please see the history of present illness.    ? ?All other systems reviewed and are negative. ? ? ?Labs/EKG Reviewed:   ? ?EKG:   ?EKG is was not ordered today.  ? ?

## 2021-12-07 NOTE — Patient Instructions (Signed)
Medication Instructions:  Your physician recommends that you continue on your current medications as directed. Please refer to the Current Medication list given to you today.  *If you need a refill on your cardiac medications before your next appointment, please call your pharmacy*   Lab Work: Your physician recommends that you return for lab work in: TODAY CBC If you have labs (blood work) drawn today and your tests are completely normal, you will receive your results only by: . MyChart Message (if you have MyChart) OR . A paper copy in the mail If you have any lab test that is abnormal or we need to change your treatment, we will call you to review the results.   Testing/Procedures: None   Follow-Up: At [facility], you and your health needs are our priority.  As part of our continuing mission to provide you with exceptional heart care, we have created designated Provider Care Teams.  These Care Teams include your primary Cardiologist (physician) and Advanced Practice Providers (APPs -  Physician Assistants and Nurse Practitioners) who all work together to provide you with the care you need, when you need it.  We recommend signing up for the patient portal called "MyChart".  Sign up information is provided on this After Visit Summary.  MyChart is used to connect with patients for Virtual Visits (Telemedicine).  Patients are able to view lab/test results, encounter notes, upcoming appointments, etc.  Non-urgent messages can be sent to your provider as well.   To learn more about what you can do with MyChart, go to [email or web address].    Your next appointment:   6 month(s)  The format for your next appointment:   In Person  Provider:   Kardie Tobb, DO   Other Instructions   

## 2021-12-16 DIAGNOSIS — J020 Streptococcal pharyngitis: Secondary | ICD-10-CM | POA: Diagnosis not present

## 2022-01-02 ENCOUNTER — Other Ambulatory Visit: Payer: Self-pay | Admitting: Obstetrics

## 2022-01-03 DIAGNOSIS — J302 Other seasonal allergic rhinitis: Secondary | ICD-10-CM

## 2022-01-03 MED ORDER — FLUTICASONE PROPIONATE 50 MCG/ACT NA SUSP
1.0000 | Freq: Two times a day (BID) | NASAL | 0 refills | Status: DC
Start: 2022-01-03 — End: 2022-01-29

## 2022-01-29 ENCOUNTER — Other Ambulatory Visit: Payer: Self-pay | Admitting: Primary Care

## 2022-01-29 DIAGNOSIS — J302 Other seasonal allergic rhinitis: Secondary | ICD-10-CM

## 2022-02-06 ENCOUNTER — Other Ambulatory Visit: Payer: Self-pay | Admitting: Obstetrics

## 2022-02-06 DIAGNOSIS — Z3480 Encounter for supervision of other normal pregnancy, unspecified trimester: Secondary | ICD-10-CM

## 2022-03-08 ENCOUNTER — Encounter: Payer: BC Managed Care – PPO | Admitting: Primary Care

## 2022-03-29 ENCOUNTER — Encounter: Payer: Self-pay | Admitting: Primary Care

## 2022-03-29 ENCOUNTER — Ambulatory Visit (INDEPENDENT_AMBULATORY_CARE_PROVIDER_SITE_OTHER): Payer: BC Managed Care – PPO | Admitting: Primary Care

## 2022-03-29 VITALS — BP 122/76 | HR 76 | Temp 98.8°F | Ht 59.0 in | Wt 262.0 lb

## 2022-03-29 DIAGNOSIS — Z0001 Encounter for general adult medical examination with abnormal findings: Secondary | ICD-10-CM

## 2022-03-29 DIAGNOSIS — F53 Postpartum depression: Secondary | ICD-10-CM | POA: Diagnosis not present

## 2022-03-29 DIAGNOSIS — I5032 Chronic diastolic (congestive) heart failure: Secondary | ICD-10-CM

## 2022-03-29 DIAGNOSIS — I10 Essential (primary) hypertension: Secondary | ICD-10-CM

## 2022-03-29 DIAGNOSIS — K219 Gastro-esophageal reflux disease without esophagitis: Secondary | ICD-10-CM

## 2022-03-29 DIAGNOSIS — Z0000 Encounter for general adult medical examination without abnormal findings: Secondary | ICD-10-CM | POA: Insufficient documentation

## 2022-03-29 DIAGNOSIS — R1905 Periumbilic swelling, mass or lump: Secondary | ICD-10-CM

## 2022-03-29 DIAGNOSIS — Z6841 Body Mass Index (BMI) 40.0 and over, adult: Secondary | ICD-10-CM

## 2022-03-29 MED ORDER — WEGOVY 0.25 MG/0.5ML ~~LOC~~ SOAJ: SUBCUTANEOUS | 0 refills | Status: DC

## 2022-03-29 NOTE — Assessment & Plan Note (Signed)
Controlled.  Continue pantoprazole 20 mg daily.  

## 2022-03-29 NOTE — Patient Instructions (Signed)
You will be contacted regarding your referral to therapy and for the abdominal ultrasound.  Please let us know if you have not been contacted within two weeks.   Start semaglutide [facility]) 0.25 mg. Inject 0.25 mg into the skin once weekly for 4 weeks, then increase to 0.5 mg once weekly thereafter.  You MUST work on [employer]. Stop eating fast/fried food. Reduce your sweet tea and soda.   Start walking daily.  Please schedule a follow up visit for 2 months.  It was a pleasure to see you today!  Preventive Care 40-34 Years Old, Female Preventive care refers to lifestyle choices and visits with your health care provider that can promote health and wellness. Preventive care visits are also called wellness exams. What can I expect for my preventive care visit? Counseling During your preventive care visit, your health care provider may ask about your: Medical history, including: Past medical problems. Family medical history. Pregnancy history. Current health, including: Menstrual cycle. Method of birth control. Emotional well-being. Home life and relationship well-being. Sexual activity and sexual health. Lifestyle, including: Alcohol, nicotine or tobacco, and drug use. Access to firearms. Diet, exercise, and sleep habits. Work and work Statistician. Sunscreen use. Safety issues such as seatbelt and bike helmet use. Physical exam Your health care provider may check your: Height and weight. These may be used to calculate your BMI (body mass index). BMI is a measurement that tells if you are at a healthy weight. Waist circumference. This measures the distance around your waistline. This measurement also tells if you are at a healthy weight and may help predict your risk of certain diseases, such as type 2 diabetes and high blood pressure. Heart rate and blood pressure. Body temperature. Skin for abnormal spots. What immunizations do I need?  Vaccines are usually given at various  ages, according to a schedule. Your health care provider will recommend vaccines for you based on your age, medical history, and lifestyle or other factors, such as travel or where you work. What tests do I need? Screening Your health care provider may recommend screening tests for certain conditions. This may include: Pelvic exam and Pap test. Lipid and cholesterol levels. Diabetes screening. This is done by checking your blood sugar (glucose) after you have not eaten for a while (fasting). Hepatitis B test. Hepatitis C test. HIV (human immunodeficiency virus) test. STI (sexually transmitted infection) testing, if you are at risk. BRCA-related cancer screening. This may be done if you have a family history of breast, ovarian, tubal, or peritoneal cancers. Talk with your health care provider about your test results, treatment options, and if necessary, the need for more tests. Follow these instructions at home: Eating and drinking  Eat a healthy diet that includes fresh fruits and vegetables, whole grains, lean protein, and low-fat dairy products. Take vitamin and mineral supplements as recommended by your health care provider. Do not drink alcohol if: Your health care provider tells you not to drink. You are pregnant, may be pregnant, or are planning to become pregnant. If you drink alcohol: Limit how much you have to 0-1 drink a day. Know how much alcohol is in your drink. In the [place] one drink equals one 12 oz bottle of beer (355 mL), one 5 oz glass of wine (148 mL), or one 1 oz glass of hard liquor (44 mL). Lifestyle Brush your teeth every morning and night with fluoride toothpaste. Floss one time each day. Exercise for at least 30 minutes 5 or more  days each week. Do not use any products that contain nicotine or tobacco. These products include cigarettes, chewing tobacco, and vaping devices, such as e-cigarettes. If you need help quitting, ask your health care provider. Do not use  drugs. If you are sexually active, practice safe sex. Use a condom or other form of protection to prevent STIs. If you do not wish to become pregnant, use a form of birth control. If you plan to become pregnant, see your health care provider for a prepregnancy visit. Find healthy ways to manage stress, such as: Meditation, yoga, or listening to music. Journaling. Talking to a trusted person. Spending time with friends and family. Minimize exposure to UV radiation to reduce your risk of skin cancer. Safety Always wear your seat belt while driving or riding in a vehicle. Do not drive: If you have been drinking alcohol. Do not ride with someone who has been drinking. If you have been using any mind-altering substances or drugs. While texting. When you are tired or distracted. Wear a helmet and other protective equipment during sports activities. If you have firearms in your house, make sure you follow all gun safety procedures. Seek help if you have been physically or sexually abused. What's next? Go to your health care provider once a year for an annual wellness visit. Ask your health care provider how often you should have your eyes and teeth checked. Stay up to date on all vaccines. This information is not intended to replace advice given to you by your health care provider. Make sure you discuss any questions you have with your health care provider. Document Revised: 03/09/2021 Document Reviewed: 03/09/2021 Elsevier Patient Education  [place].

## 2022-03-29 NOTE — Assessment & Plan Note (Signed)
Following with cardiology, office notes from March 2023 reviewed.

## 2022-03-29 NOTE — Assessment & Plan Note (Signed)
Controlled.  Continue nifedipine 30 mg daily.

## 2022-03-29 NOTE — Progress Notes (Signed)
Subjective:    Patient ID: Suzanne [name], female    DOB: May 28, 1986, 36 y.o.   MRN: [id number]  HPI  Suzanne [name] is a very pleasant 36 y.o. female who presents today for complete physical and follow up of chronic conditions. She would also like to discuss several issues.   She would also like to discuss symptoms of depression. Symptoms include little motivation to do things, not wanting to be around others. She is still grieving the loss of her baby's father. She is living with her mother while she's trying to find an apartment. Symptom onset 6+ months ago . She does not meet with a therapist. She denies SI/HI.      03/29/2022    2:59 PM 05/04/2021    9:06 AM 02/12/2021    1:17 PM  PHQ9 SCORE ONLY  PHQ-9 Total Score 4 0 2   She would also like to discuss her morbid obesity. She is frustrated with her ongoing weight gain over the last year. She has a history of weight loss followed by weight re-gain.   Diet currently consists of:  Breakfast: Cereal, fast food, or skips breakfast Lunch: Frozen food (hot pocket), left overs, take out or fast food. Dinner: Take out and fast food Snacks: None. Desserts: Infrequently  Beverages: Soda, water, sweet tea  She understands that she needs to improve her diet and start exercising, but she is living with her mother and cooking is not easy. She is interested in weight loss medication, specifically the once weekly injectable medications. Her mother had bariatric surgery in October 2022 and has done well. She is not interested in surgery.  She would also like to discuss chronic umbilical mass. Chronic for years, progressing in pain but not size. The mass causes pain if she wears her pants too long, if her baby bounces on her abdomen, or during menses. She can feel the mass. She's never undergone evaluation for this.    Immunizations: -Tetanus: 2022 -Influenza: Completed last season  -Covid-19: 2 vaccines    Exercise: No regular exercise. Some  walking  Eye exam: Completed last year.  Dental exam: Completes semi-annually   Pap Smear: Completed in 2022  BP Readings from Last 3 Encounters:  03/29/22 122/76  12/07/21 122/86  09/22/21 136/78   Wt Readings from Last 3 Encounters:  03/29/22 262 lb (118.8 kg)  12/07/21 256 lb 6.4 oz (116.3 kg)  09/22/21 247 lb 12.8 oz (112.4 kg)      Review of Systems  Constitutional:  Negative for unexpected weight change.  HENT:  Negative for rhinorrhea.   Respiratory:  Negative for cough and shortness of breath.   Cardiovascular:  Negative for chest pain.  Gastrointestinal:  Negative for constipation and diarrhea.       Abdominal mass  Genitourinary:  Negative for difficulty urinating.  Musculoskeletal:  Negative for arthralgias and myalgias.  Skin:  Negative for rash.  Allergic/Immunologic: Negative for environmental allergies.  Neurological:  Negative for dizziness and headaches.  Psychiatric/Behavioral:         See HPI         Past Medical History:  Diagnosis Date   Acid reflux    Blood transfusion without reported diagnosis    History of C-section 02/02/2021   For fetal intolerance of labor C/S x1 Desires VBAC   Hypertension    Morbid obesity (HCC) 02/12/2021   Postpartum care following cesarean delivery 08/10/2021   Sickle cell trait (HCC)     Social  History   Socioeconomic History   Marital status: Single    Spouse name: Not on file   Number of children: Not on file   Years of education: Not on file   Highest education level: Not on file  Occupational History   Not on file  Tobacco Use   Smoking status: Never   Smokeless tobacco: Never  Vaping Use   Vaping Use: Never used  Substance and Sexual Activity   Alcohol use: No   Drug use: No   Sexual activity: Not Currently    Partners: Male    Birth control/protection: None  Other Topics Concern   Not on file  Social History Narrative   Completed some college   Works at [employer]   From [place]    Has one son, 7 years.   Enjoys playing with her son, sleeping.   Family is established here as well.   Social Determinants of Health   Financial Resource Strain: Low Risk  (02/12/2021)   Overall Financial Resource Strain (CARDIA)    Difficulty of Paying Living Expenses: Not hard at all  Food Insecurity: No Food Insecurity (02/12/2021)   Hunger Vital Sign    Worried About Running Out of Food in the Last Year: Never true    Ran Out of Food in the Last Year: Never true  Transportation Needs: No Transportation Needs (02/12/2021)   PRAPARE - Administrator, Civil Service (Medical): No    Lack of Transportation (Non-Medical): No  Physical Activity: Unknown (02/12/2021)   Exercise Vital Sign    Days of Exercise per Week: 0 days    Minutes of Exercise per Session: Not on file  Stress: Stress Concern Present (02/12/2021)   [employer] of Occupational Health - Occupational Stress Questionnaire    Feeling of Stress : To some extent  Social Connections: Unknown (02/12/2021)   Social Connection and Isolation Panel [NHANES]    Frequency of Communication with Friends and Family: More than three times a week    Frequency of Social Gatherings with Friends and Family: More than three times a week    Attends Religious Services: Not on file    Active Member of Clubs or Organizations: Not on file    Attends Banker Meetings: Not on file    Marital Status: Not on file  Intimate Partner Violence: Not At Risk (02/12/2021)   Humiliation, Afraid, Rape, and Kick questionnaire    Fear of Current or Ex-Partner: No    Emotionally Abused: No    Physically Abused: No    Sexually Abused: No    Past Surgical History:  Procedure Laterality Date   CESAREAN SECTION N/A 10/08/2013   Procedure: CESAREAN SECTION;  Surgeon: Antionette Char, MD;  Location: [facility];  Service: Obstetrics;  Laterality: N/A;   CESAREAN SECTION N/A 07/10/2021   Procedure: CESAREAN SECTION;  Surgeon: [name], MD;  Location: [facility];  Service: Obstetrics;  Laterality: N/A;    Family History  Problem Relation Age of Onset   Diabetes Mother    Arthritis Mother    Hypertension Mother    Diabetes Maternal Grandmother    Hypertension Maternal Grandmother    Cancer Neg Hx    Heart disease Neg Hx     Allergies  Allergen Reactions   Hydralazine Itching   Lasix [Furosemide] Swelling    Mouth and right side of face with oral furosemide. Pt can tolerate IV form     Current Outpatient Medications  on File Prior to Visit  Medication Sig Dispense Refill   acetaminophen (TYLENOL) 325 MG tablet Take 2 tablets (650 mg total) by mouth every 4 (four) hours as needed for mild pain (for pain scale < 4). 60 tablet 0   fluticasone (FLONASE) 50 MCG/ACT nasal spray PLACE 1 SPRAY INTO BOTH NOSTRILS 2 (TWO) TIMES DAILY 48 mL 0   ibuprofen (ADVIL) 600 MG tablet TAKE 1 TABLET BY MOUTH EVERY 6 HOURS 30 tablet 11   NIFEdipine (ADALAT CC) 30 MG 24 hr tablet Take 1 tablet (30 mg total) by mouth daily. For blood pressure. 90 tablet 2   pantoprazole (PROTONIX) 20 MG tablet Take 1 tablet (20 mg total) by mouth daily. For heartburn. 90 tablet 2   ferrous sulfate (FERROUSUL) 325 (65 FE) MG tablet Take 1 tablet (325 mg total) by mouth every other day. (Patient not taking: Reported on 12/07/2021) 30 tablet 3   torsemide (DEMADEX) 20 MG tablet Take 1 tablet (20 mg total) by mouth daily. Only take for 5 days. (Patient not taking: Reported on 12/07/2021) 5 tablet 0   [DISCONTINUED] hydrochlorothiazide (HYDRODIURIL) 12.5 MG tablet TAKE 1 TABLET BY MOUTH EVERY DAY FOR BLOOD PRESSURE (Patient not taking: Reported on 12/10/2020) 90 tablet 0   No current facility-administered medications on file prior to visit.    BP 122/76   Pulse 76   Temp 98.8 F (37.1 C) (Oral)   Ht 4\' 11"  (1.499 m)   Wt 262 lb (118.8 kg)   SpO2 98%   BMI 52.92 kg/m  Objective:   Physical Exam HENT:     Right Ear: Tympanic membrane and ear  canal normal.     Left Ear: Tympanic membrane and ear canal normal.     Nose: Nose normal.  Eyes:     Conjunctiva/sclera: Conjunctivae normal.     Pupils: Pupils are equal, round, and reactive to light.  Neck:     Thyroid: No thyromegaly.  Cardiovascular:     Rate and Rhythm: Normal rate and regular rhythm.     Heart sounds: No murmur heard. Pulmonary:     Effort: Pulmonary effort is normal.     Breath sounds: Normal breath sounds. No rales.  Abdominal:     General: Bowel sounds are normal.     Palpations: Abdomen is soft.     Tenderness: There is no abdominal tenderness.       Comments: 2 cm rounded, subcutaneous mass to left periumbilical region. Tender.   Musculoskeletal:        General: Normal range of motion.     Cervical back: Neck supple.  Lymphadenopathy:     Cervical: No cervical adenopathy.  Skin:    General: Skin is warm and dry.     Findings: No rash.  Neurological:     Mental Status: She is alert and oriented to person, place, and time.     Cranial Nerves: No cranial nerve deficit.     Deep Tendon Reflexes: Reflexes are normal and symmetric.           Assessment & Plan:   Problem List Items Addressed This Visit       Cardiovascular and Mediastinum   Essential hypertension    Controlled.  Continue nifedipine 30 mg daily.      Relevant Orders   Lipid panel   Hemoglobin A1c   Comprehensive metabolic panel   CBC   TSH   Chronic diastolic HF (heart failure) (HCC)    Following with cardiology,  office notes from March 2023 reviewed.          Digestive   Acid reflux    Controlled.  Continue pantoprazole 20 mg daily.        Other   Morbid obesity with BMI of 50.0-59.9, adult (HCC)    Long discussion today regarding the absolute need to stop eating fast/fried/take out food, stop sodas and sweet tea, and to start cooking meals at home. She verbalized understanding.   Rx for Wegovy 0.25 mg weekly x 4 weeks, then increase to 0.5 mg weekly  thereafter sent to pharmacy.  She will follow up in 2 months, after she begins the prescription.   Checking labs today      Relevant Medications   Semaglutide-Weight Management (WEGOVY) 0.25 MG/0.5ML SOAJ   Encounter for annual general medical examination with abnormal findings in adult - Primary    Immunizations UTD. Pap smear UTD.  Discussed the importance of a healthy diet and regular exercise in order for weight loss, and to reduce the risk of further co-morbidity.  Exam stable. Labs pending.  Follow up in 1 year for repeat physical.       Other Visit Diagnoses     Post partum depression       Relevant Orders   Ambulatory referral to Psychology   Periumbilical mass       Relevant Orders   US Abdomen Limited          Doreene Nest, NP

## 2022-03-29 NOTE — Assessment & Plan Note (Signed)
Immunizations UTD. Pap smear UTD  Discussed the importance of a healthy diet and regular exercise in order for weight loss, and to reduce the risk of further co-morbidity.  Exam stable. Labs pending.  Follow up in 1 year for repeat physical.  

## 2022-03-29 NOTE — Assessment & Plan Note (Signed)
Long discussion today regarding the absolute need to stop eating fast/fried/take out food, stop sodas and sweet tea, and to start cooking meals at home. She verbalized understanding.   Rx for Wegovy 0.25 mg weekly x 4 weeks, then increase to 0.5 mg weekly thereafter sent to pharmacy.  She will follow up in 2 months, after she begins the prescription.   Checking labs today

## 2022-03-30 LAB — COMPREHENSIVE METABOLIC PANEL
ALT: 13 U/L (ref 0–35)
AST: 15 U/L (ref 0–37)
Albumin: 4 g/dL (ref 3.5–5.2)
Alkaline Phosphatase: 87 U/L (ref 39–117)
BUN: 9 mg/dL (ref 6–23)
CO2: 28 mEq/L (ref 19–32)
Calcium: 9.8 mg/dL (ref 8.4–10.5)
Chloride: 103 mEq/L (ref 96–112)
Creatinine, Ser: 0.59 mg/dL (ref 0.40–1.20)
GFR: 118.21 mL/min (ref >60.00)
Glucose, Bld: 84 mg/dL (ref 70–99)
Potassium: 4.2 mEq/L (ref 3.5–5.1)
Sodium: 137 mEq/L (ref 135–145)
Total Bilirubin: 0.3 mg/dL (ref 0.2–1.2)
Total Protein: 7.4 g/dL (ref 6.0–8.3)

## 2022-03-30 LAB — LIPID PANEL
Cholesterol: 197 mg/dL (ref 0–200)
HDL: 42.8 mg/dL (ref >39.00)
LDL Cholesterol: 124 mg/dL — ABNORMAL HIGH (ref 0–99)
NonHDL: 154.47
Total CHOL/HDL Ratio: 5
Triglycerides: 153 mg/dL — ABNORMAL HIGH (ref 0.0–149.0)
VLDL: 30.6 mg/dL (ref 0.0–40.0)

## 2022-03-30 LAB — CBC
HCT: 36.1 % (ref 36.0–46.0)
Hemoglobin: 11.1 g/dL — ABNORMAL LOW (ref 12.0–15.0)
MCHC: 30.8 g/dL (ref 30.0–36.0)
MCV: 68 fl — ABNORMAL LOW (ref 78.0–100.0)
Platelets: 317 10*3/uL (ref 150.0–400.0)
RBC: 5.3 Mil/uL — ABNORMAL HIGH (ref 3.87–5.11)
RDW: 17.5 % — ABNORMAL HIGH (ref 11.5–15.5)
WBC: 6.6 10*3/uL (ref 4.0–10.5)

## 2022-03-30 LAB — TSH: TSH: 2.3 u[IU]/mL (ref 0.35–5.50)

## 2022-03-30 LAB — HEMOGLOBIN A1C: Hgb A1c MFr Bld: 5.9 % (ref 4.6–6.5)

## 2022-03-30 NOTE — Addendum Note (Signed)
Addended by: Ilda Foil on: 03/30/2022 04:59 PM   Modules accepted: Orders

## 2022-03-31 ENCOUNTER — Other Ambulatory Visit: Payer: Self-pay | Admitting: Primary Care

## 2022-03-31 ENCOUNTER — Other Ambulatory Visit: Payer: Medicaid Other

## 2022-03-31 DIAGNOSIS — F53 Postpartum depression: Secondary | ICD-10-CM

## 2022-03-31 LAB — IBC + FERRITIN
Ferritin: 14.7 ng/mL (ref 10.0–291.0)
Iron: 32 ug/dL — ABNORMAL LOW (ref 42–145)
Saturation Ratios: 7.9 % — ABNORMAL LOW (ref 20.0–50.0)
TIBC: 406 ug/dL (ref 250.0–450.0)
Transferrin: 290 mg/dL (ref 212.0–360.0)

## 2022-04-05 ENCOUNTER — Telehealth: Payer: Self-pay

## 2022-04-05 ENCOUNTER — Ambulatory Visit
Admission: RE | Admit: 2022-04-05 | Discharge: 2022-04-05 | Disposition: A | Discharge status: Home / Self Care | Payer: Medicaid Other | Source: Ambulatory Visit | Attending: Primary Care | Admitting: Primary Care

## 2022-04-05 DIAGNOSIS — R1905 Periumbilic swelling, mass or lump: Secondary | ICD-10-CM

## 2022-04-05 DIAGNOSIS — R1909 Other intra-abdominal and pelvic swelling, mass and lump: Secondary | ICD-10-CM | POA: Diagnosis not present

## 2022-04-05 NOTE — Telephone Encounter (Signed)
PA started in [email or web address] for [employer]. Key: [id number]. Awaiting determination.

## 2022-04-06 NOTE — Telephone Encounter (Signed)
Please notify patient that her insurance will not cover [facility] for weight loss. She can call her insurance to find out what they will cover.  Have her ask about Saxenda.

## 2022-04-06 NOTE — Telephone Encounter (Signed)
Left message to return call to our office.  

## 2022-04-06 NOTE — Telephone Encounter (Signed)
Prior auth for [employer] 0.25MG /0.5ML auto-injectors has been denied. Suzanne [name] (Suzanne [name]) Rx #: [id number]  We denied your request for: Wegovy Soln Auto-inj The criteria we used to make this decision is based on our plan benefits. Here are the policy requirements your request did not meet: Weight loss are an excluded product/service.  Denial letter sent to scanning.

## 2022-04-07 NOTE — Telephone Encounter (Signed)
Patient called in regarding VM that was left. Informed patient of message below, [place] will contact her insurance and keep [place] updated. Patient had questions regarding [place] results, and would like a call back when possible. Thank you.

## 2022-04-07 NOTE — Telephone Encounter (Signed)
See results notes for u/s documentation.

## 2022-04-11 ENCOUNTER — Other Ambulatory Visit: Payer: Self-pay | Admitting: Primary Care

## 2022-04-11 ENCOUNTER — Encounter: Payer: Self-pay | Admitting: *Deleted

## 2022-04-11 DIAGNOSIS — R1905 Periumbilic swelling, mass or lump: Secondary | ICD-10-CM

## 2022-05-10 ENCOUNTER — Ambulatory Visit
Admission: RE | Admit: 2022-05-10 | Discharge: 2022-05-10 | Disposition: A | Discharge status: Home / Self Care | Payer: BC Managed Care – PPO | Source: Ambulatory Visit | Attending: Primary Care | Admitting: Primary Care

## 2022-05-10 DIAGNOSIS — R109 Unspecified abdominal pain: Secondary | ICD-10-CM | POA: Diagnosis not present

## 2022-05-10 DIAGNOSIS — R1905 Periumbilic swelling, mass or lump: Secondary | ICD-10-CM

## 2022-05-12 ENCOUNTER — Other Ambulatory Visit: Payer: Self-pay | Admitting: Primary Care

## 2022-05-12 DIAGNOSIS — K429 Umbilical hernia without obstruction or gangrene: Secondary | ICD-10-CM

## 2022-05-31 ENCOUNTER — Ambulatory Visit: Payer: Medicaid Other | Admitting: Primary Care

## 2022-06-08 ENCOUNTER — Other Ambulatory Visit: Payer: Self-pay

## 2022-06-08 DIAGNOSIS — D509 Iron deficiency anemia, unspecified: Secondary | ICD-10-CM

## 2022-06-08 MED ORDER — FERROUS SULFATE 325 (65 FE) MG PO TABS: 325.0000 mg | ORAL_TABLET | ORAL | 3 refills | Status: DC

## 2022-06-14 ENCOUNTER — Ambulatory Visit: Payer: Medicaid Other | Admitting: Primary Care

## 2022-06-14 ENCOUNTER — Ambulatory Visit: Payer: BC Managed Care – PPO | Admitting: Cardiology

## 2022-06-28 ENCOUNTER — Ambulatory Visit: Payer: Medicaid Other | Admitting: Primary Care

## 2022-07-24 ENCOUNTER — Telehealth: Payer: Self-pay

## 2022-07-24 NOTE — Telephone Encounter (Signed)
Prior auth started for [facility] 0.25MG /0.5ML auto-injectors. Suzanne [name] (Key: [id number]) Rx #: [id number] Waiting for determination.

## 2022-07-24 NOTE — Telephone Encounter (Signed)
Prior auth approved for Suzanne [name] 0.25MG /0.5ML auto-injectors. Suzanne [name] (Key: [id number]) Rx #: [id number] Your prior authorization for Suzanne [name] has been approved! Message from plan: Effective from 07/24/2022 through 11/26/2022.  Patient notified via mychart.

## 2022-08-09 ENCOUNTER — Other Ambulatory Visit: Payer: Self-pay | Admitting: Primary Care

## 2022-08-09 DIAGNOSIS — I10 Essential (primary) hypertension: Secondary | ICD-10-CM

## 2022-08-22 ENCOUNTER — Other Ambulatory Visit: Payer: Self-pay | Admitting: Primary Care

## 2022-08-22 DIAGNOSIS — K219 Gastro-esophageal reflux disease without esophagitis: Secondary | ICD-10-CM

## 2022-08-25 DIAGNOSIS — R7303 Prediabetes: Secondary | ICD-10-CM

## 2022-08-25 MED ORDER — TIRZEPATIDE 2.5 MG/0.5ML ~~LOC~~ SOAJ: 2.5000 mg | SUBCUTANEOUS | 0 refills | Status: DC

## 2022-08-28 ENCOUNTER — Telehealth: Payer: Self-pay

## 2022-08-28 ENCOUNTER — Other Ambulatory Visit (HOSPITAL_COMMUNITY): Payer: Self-pay

## 2022-08-28 DIAGNOSIS — R7303 Prediabetes: Secondary | ICD-10-CM

## 2022-08-28 NOTE — Telephone Encounter (Signed)
Pharmacy Patient Advocate Encounter   Received notification from [facility] that prior authorization for [facility] 2.5MG /0.5ML pen-injectors is required/requested.  PA submitted on 08/28/22 to [facility] via CoverMyMeds Key [id number]  Status is pending

## 2022-08-29 NOTE — Telephone Encounter (Signed)
Pharmacy Patient Advocate Encounter  Received notification from [facility] that the request for prior authorization for [employer] 2.5MG /0.5ML pen-injectors has been denied due to The request does not meet the definition of Medical Necessity found in the member's benefit booklet  .     Key: [id number]   Denial letter attached to patient's chart

## 2022-09-19 MED ORDER — WEGOVY 0.25 MG/0.5ML ~~LOC~~ SOAJ: 0.2500 mg | SUBCUTANEOUS | 0 refills | Status: DC

## 2022-09-19 NOTE — Addendum Note (Signed)
Addended by: Doreene Nest on: 09/19/2022 04:46 PM   Modules accepted: Orders

## 2022-10-18 NOTE — Telephone Encounter (Signed)
Please ensure that a PA has been started for [facility].

## 2022-10-20 NOTE — Telephone Encounter (Signed)
Patient Advocate Encounter   Received notification from [place] that prior authorization for [facility] is required.   PA submitted on 10/20/2022 Key [id number] Status is pending

## 2022-10-23 NOTE — Telephone Encounter (Signed)
Pharmacy Patient Advocate Encounter  Prior Authorization for Suzanne [name] has been approved.    Effective dates: 10/20/2022 through 05/18/2023

## 2022-11-21 DIAGNOSIS — S93492A Sprain of other ligament of left ankle, initial encounter: Secondary | ICD-10-CM | POA: Diagnosis not present

## 2022-11-27 NOTE — H&P (Signed)
   Patient: Deisy Frick  PID: [id number]  DOB: 05-28-1986  SEX: Female   Patient referred by DDS for extraction wisdom teeth.  CC: On and off pain in lower wisdom teeth.  Past Medical History:  High Blood Pressure, Blood transfusion, Anemia, Morbid Obesity    Medications: Pantoprazole, Nifedipine    Allergies:     Latex    Surgeries:   None     Social History       Smoking:  n         Alcohol:n Drug use:n                             Exam: BMI  52. Impacted # 1, 17, 32. Erupted # 16 with deep distal decay. No purulence, edema, fluctuance, trismus. Oral cancer screening negative. Pharynx clear. No lymphadenopathy.  Panorex:Impacted # 1, 17, 32. Erupted # 16 with deep distal decay.  Assessment: ASA 3. Non-restorable tooth #16, impacted teeth 1, 17, 32.               Plan: Extraction Teeth # 1, 16, 17, 32.   Hospital Day surgery.                 Rx: n               Risks and complications explained. Questions answered.   Gae Bon, DMD

## 2022-11-29 ENCOUNTER — Encounter (HOSPITAL_COMMUNITY): Payer: Self-pay | Admitting: Oral Surgery

## 2022-11-29 ENCOUNTER — Other Ambulatory Visit: Payer: Self-pay

## 2022-11-29 NOTE — Pre-Procedure Instructions (Signed)
PCP - Dr.Katherine Carlis Abbott Cardiologist - Dr.Kardie Tobb  PPM/ICD - pt denies Device Orders - n/a Rep Notified - n/a  EKG - 02/11/21 Stress Test - pt denies ECHO - 02/25/21 Cardiac Cath - pt denies  Sleep Study/CPAP - pt denies  Diabetic- pt denies Last dose of GLP1 agonist-  pt denies. Patient states she has not picked up [facility] from pharmacy. GLP1 instructions: Pt aware not to take this before her surgery.  Blood Thinner Instructions:pt denies Aspirin Instructions:pt aware to stop aspirin products starting today.  NPO after Midnight   COVID TEST- n/a   Anesthesia review: YES. Heart history, CHF. BMI over 50.    Patient verbally denies any shortness of breath, fever, cough and chest pain during phone call.     -------------  SDW INSTRUCTIONS given:   Your procedure is scheduled on 12/01/22.             Report to [facility] Main Entrance "A" at  5:30  A.M., and check in at the Admitting office.             Call this number if you have problems the morning of surgery:             [phone]               Remember:             Do not eat or drink after midnight the night before your surgery                          Take these medicines the morning of surgery with A SIP OF WATER -pantoprazole (PROTONIX) ,NIFEdipine (ADALAT CC)   As of today, STOP taking any Aspirin (unless otherwise instructed by your surgeon) Aleve, Naproxen, Ibuprofen, Motrin, Advil, Goody's, BC's, all herbal medications, fish oil, and all vitamins                      Do not wear jewelry, make up, or nail polish            Do not wear lotions, powders, perfumes/colognes, or deodorant.            Do not shave 48 hours prior to surgery.  Men may shave face and neck.            Do not bring valuables to the hospital.            [facility] is not responsible for any belongings or valuables.   Do NOT Smoke (Tobacco/Vaping) 24 hours prior to your procedure If you use a CPAP at night, you may bring all  equipment for your overnight stay.   Contacts, glasses, dentures or partials may not be worn into surgery.      For patients admitted to the hospital, discharge time will be determined by your treatment team.   Patients discharged the day of surgery will not be allowed to drive home, and someone needs to stay with them for 24 hours.     Special instructions:   [facility]- Preparing For Surgery  Oral Hygiene is also important to reduce your risk of infection.  Remember - BRUSH YOUR TEETH THE MORNING OF SURGERY WITH YOUR REGULAR TOOTHPASTE   Before surgery, you can play an important role. Because skin is not sterile, your skin needs to be as free of germs as possible. You can reduce the number of germs on your skin  by washing with Dial (or any antibacterial) Soap before surgery.    Please do not use if you have an allergy to antibacterial soaps. If your skin becomes reddened/irritated stop using the Antibacterial Soap  Do not shave (including legs and underarms) for at least 48 hours prior to surgery. It is OK to shave your face.   Please follow these instructions carefully.              Shower the NIGHT BEFORE SURGERY and the MORNING OF SURGERY with (DIAL) Antibacterial Soap. Wash your body and hair with your normal shampoo/soap then rinse. Using a clean wash cloth wash from your neck down using the antibacterial soap. Wash thoroughly, paying special attention to the area where your surgery will be performed. Thoroughly rinse your body with warm water from the neck down.   Pat yourself dry with a CLEAN TOWEL.   Wear CLEAN PAJAMAS to bed the night before surgery.   Place CLEAN SHEETS on your bed the night of your surgery and DO NOT SLEEP WITH PETS.     Day of Surgery: Please shower morning of surgery using antibacterial soap Wear Clean/Comfortable clothing the morning of surgery Do not apply any deodorants/lotions.   Remember to brush your teeth WITH YOUR REGULAR TOOTHPASTE.    Questions were answered. Patient verbalized understanding of instructions.

## 2022-11-30 NOTE — Progress Notes (Signed)
Anesthesia Chart Review: Suzanne [name]  Case: [id number] Date/Time: 12/01/22 0700   Procedure: DENTAL RESTORATION/EXTRACTIONS   Anesthesia type: General   Pre-op diagnosis: DENTAL CARRIES   Location: [facility] OR ROOM 04 / [facility]   Surgeons: Diona Browner, DMD       DISCUSSION: Patient is a 37 year old female scheduled for the above procedure.  History includes never smoker, Sickle cell trait, HTN, HFpEF, acid reflux, morbid obesity.   She was followed by cardiologist Dr. Harriet Masson for post-partum HTN in the [facility]. Last visit 12/07/21. She was doing well from a CV standpoint. BP was controlled. Continue nifedipine 30 mg daily BP monitoring. Six month HTN follow-up planned.   She is a same day work-up, so anesthesia team evaluation on the day of surgery. BP 122/76 on 03/29/22. By medication list, she remains on nifedipine. Labs and EKG on arrival as indicated. She has not yet started [facility].    VS: Ht '4\' 11"'$  (1.499 m)   Wt 116.3 kg   BMI 51.79 kg/m  BP Readings from Last 3 Encounters:  03/29/22 122/76  12/07/21 122/86  09/22/21 136/78   Pulse Readings from Last 3 Encounters:  03/29/22 76  12/07/21 91  09/22/21 98    PROVIDERS: [name], NP is PCP  [name], DO is cardiologist    LABS: For day of surgery as indicated. Normal CMET, H/H 11.1/36.1 on 03/29/22.     EKG: EKG 02/11/21: ST, poor r wave progression.   CV: Echo 02/25/21: IMPRESSIONS   1. Left ventricular ejection fraction, by estimation, is 60 to 65%. The  left ventricle has normal function. The left ventricle has no regional  wall motion abnormalities. Left ventricular diastolic parameters were  normal. The average left ventricular  global longitudinal strain is -23.7 %. The global longitudinal strain is  normal.   2. Right ventricular systolic function is normal. The right ventricular  size is normal. Tricuspid regurgitation signal is inadequate for assessing  PA pressure.   3. The mitral  valve is normal in structure. No evidence of mitral valve  regurgitation. No evidence of mitral stenosis.   4. The aortic valve is normal in structure. Aortic valve regurgitation is  not visualized. No aortic stenosis is present.   5. The inferior vena cava is normal in size with greater than 50%  respiratory variability, suggesting right atrial pressure of 3 mmHg.    Past Medical History:  Diagnosis Date   Acid reflux    Blood transfusion without reported diagnosis    CHF (congestive heart failure) ([place])    History of C-section 02/02/2021   For fetal intolerance of labor C/S x1 Desires VBAC   Hypertension    Morbid obesity ([facility]) 02/12/2021   Postpartum care following cesarean delivery 08/10/2021   Sickle cell trait [facility])     Past Surgical History:  Procedure Laterality Date   CESAREAN SECTION N/A 10/08/2013   Procedure: CESAREAN SECTION;  Surgeon: Lahoma Crocker, MD;  Location: [facility];  Service: Obstetrics;  Laterality: N/A;   CESAREAN SECTION N/A 07/10/2021   Procedure: CESAREAN SECTION;  Surgeon: Florian Buff, MD;  Location: [facility] OR[facility];  Service: Obstetrics;  Laterality: N/A;    MEDICATIONS: No current facility-administered medications for this encounter.    Aspirin-Acetaminophen-Caffeine (GOODY HEADACHE PO)   ibuprofen (ADVIL) 600 MG tablet   NIFEdipine (ADALAT CC) 30 MG 24 hr tablet   pantoprazole (PROTONIX) 20 MG tablet   acetaminophen (TYLENOL) 325 MG tablet  ferrous sulfate (FERROUSUL) 325 (65 FE) MG tablet   fluticasone (FLONASE) 50 MCG/ACT nasal spray   Semaglutide-Weight Management (WEGOVY) 0.25 MG/0.5ML SOAJ    Myra Gianotti, PA-C Surgical Short Stay/Anesthesiology [facility] Phone [phone] [facility] Phone [phone] 11/30/2022 10:56 AM

## 2022-11-30 NOTE — Anesthesia Preprocedure Evaluation (Addendum)
Anesthesia Evaluation  Patient identified by MRN, date of birth, ID band Patient awake    Reviewed: Allergy & Precautions, NPO status , Patient's Chart, lab work & pertinent test results  History of Anesthesia Complications Negative for: history of anesthetic complications  Airway Mallampati: III  TM Distance: >3 FB Neck ROM: Full    Dental  (+) Dental Advisory Given   Pulmonary neg pulmonary ROS   Pulmonary exam normal breath sounds clear to auscultation       Cardiovascular hypertension (nifedipine), Pt. on medications (-) angina +CHF (EF 60-65%)  (-) Past MI, (-) Cardiac Stents and (-) CABG (-) dysrhythmias  Rhythm:Regular Rate:Normal  TTE 02/25/2021: IMPRESSIONS     1. Left ventricular ejection fraction, by estimation, is 60 to 65%. The  left ventricle has normal function. The left ventricle has no regional  wall motion abnormalities. Left ventricular diastolic parameters were  normal. The average left ventricular  global longitudinal strain is -23.7 %. The global longitudinal strain is  normal.   2. Right ventricular systolic function is normal. The right ventricular  size is normal. Tricuspid regurgitation signal is inadequate for assessing  PA pressure.   3. The mitral valve is normal in structure. No evidence of mitral valve  regurgitation. No evidence of mitral stenosis.   4. The aortic valve is normal in structure. Aortic valve regurgitation is  not visualized. No aortic stenosis is present.   5. The inferior vena cava is normal in size with greater than 50%  respiratory variability, suggesting right atrial pressure of 3 mmHg.     Neuro/Psych negative neurological ROS     GI/Hepatic Neg liver ROS,GERD  Medicated,,  Endo/Other  neg diabetes  Morbid obesity  Renal/GU negative Renal ROS     Musculoskeletal   Abdominal  (+) + obese  Peds  Hematology  (+) Blood dyscrasia (alpha thalassemis silent  carrier), Sickle cell trait   Anesthesia Other Findings Not yet started [facility]  Reproductive/Obstetrics                             Anesthesia Physical Anesthesia Plan  ASA: 3  Anesthesia Plan: General   Post-op Pain Management: Tylenol PO (pre-op)*   Induction: Intravenous  PONV Risk Score and Plan: 3 and Ondansetron, Dexamethasone and Treatment may vary due to age or medical condition  Airway Management Planned: Video Laryngoscope Planned and Nasal ETT  Additional Equipment:   Intra-op Plan:   Post-operative Plan: Extubation in OR  Informed Consent: I have reviewed the patients History and Physical, chart, labs and discussed the procedure including the risks, benefits and alternatives for the proposed anesthesia with the patient or authorized representative who has indicated his/her understanding and acceptance.     Dental advisory given  Plan Discussed with: CRNA and Anesthesiologist  Anesthesia Plan Comments: (PAT note written 11/30/2022 by Myra Gianotti, PA-C.  Risks of general anesthesia discussed including, but not limited to, sore throat, hoarse voice, chipped/damaged teeth, injury to vocal cords, nausea and vomiting, allergic reactions, lung infection, heart attack, stroke, and death. All questions answered.  )       Anesthesia Quick Evaluation

## 2022-12-01 ENCOUNTER — Ambulatory Visit (HOSPITAL_BASED_OUTPATIENT_CLINIC_OR_DEPARTMENT_OTHER): Payer: No Typology Code available for payment source | Admitting: Vascular Surgery

## 2022-12-01 ENCOUNTER — Ambulatory Visit (HOSPITAL_COMMUNITY): Payer: No Typology Code available for payment source | Admitting: Vascular Surgery

## 2022-12-01 ENCOUNTER — Ambulatory Visit (HOSPITAL_COMMUNITY)
Admission: RE | Admit: 2022-12-01 | Discharge: 2022-12-01 | Disposition: A | Discharge status: Home / Self Care | Payer: No Typology Code available for payment source | Source: Ambulatory Visit | Attending: Oral Surgery | Admitting: Oral Surgery

## 2022-12-01 ENCOUNTER — Encounter (HOSPITAL_COMMUNITY)
Admission: RE | Disposition: A | Discharge status: Home / Self Care | Payer: Self-pay | Source: Ambulatory Visit | Attending: Oral Surgery

## 2022-12-01 DIAGNOSIS — K219 Gastro-esophageal reflux disease without esophagitis: Secondary | ICD-10-CM | POA: Insufficient documentation

## 2022-12-01 DIAGNOSIS — K011 Impacted teeth: Secondary | ICD-10-CM

## 2022-12-01 DIAGNOSIS — K029 Dental caries, unspecified: Secondary | ICD-10-CM

## 2022-12-01 DIAGNOSIS — Z6841 Body Mass Index (BMI) 40.0 and over, adult: Secondary | ICD-10-CM | POA: Diagnosis not present

## 2022-12-01 DIAGNOSIS — K0889 Other specified disorders of teeth and supporting structures: Secondary | ICD-10-CM

## 2022-12-01 DIAGNOSIS — Z79899 Other long term (current) drug therapy: Secondary | ICD-10-CM | POA: Insufficient documentation

## 2022-12-01 DIAGNOSIS — I509 Heart failure, unspecified: Secondary | ICD-10-CM | POA: Diagnosis not present

## 2022-12-01 DIAGNOSIS — I110 Hypertensive heart disease with heart failure: Secondary | ICD-10-CM | POA: Diagnosis not present

## 2022-12-01 HISTORY — PX: TOOTH EXTRACTION: SHX859

## 2022-12-01 HISTORY — DX: Heart failure, unspecified: I50.9

## 2022-12-01 LAB — CBC
HCT: 35.7 % — ABNORMAL LOW (ref 36.0–46.0)
Hemoglobin: 11.1 g/dL — ABNORMAL LOW (ref 12.0–15.0)
MCH: 21.7 pg — ABNORMAL LOW (ref 26.0–34.0)
MCHC: 31.1 g/dL (ref 30.0–36.0)
MCV: 69.7 fL — ABNORMAL LOW (ref 80.0–100.0)
Platelets: 350 10*3/uL (ref 150–400)
RBC: 5.12 MIL/uL — ABNORMAL HIGH (ref 3.87–5.11)
RDW: 17.2 % — ABNORMAL HIGH (ref 11.5–15.5)
WBC: 6.9 10*3/uL (ref 4.0–10.5)
nRBC: 0 % (ref 0.0–0.2)

## 2022-12-01 LAB — BASIC METABOLIC PANEL
Anion gap: 11 (ref 5–15)
BUN: 10 mg/dL (ref 6–20)
CO2: 22 mmol/L (ref 22–32)
Calcium: 8.9 mg/dL (ref 8.9–10.3)
Chloride: 105 mmol/L (ref 98–111)
Creatinine, Ser: 0.67 mg/dL (ref 0.44–1.00)
GFR, Estimated: >60 mL/min (ref >60)
Glucose, Bld: 108 mg/dL — ABNORMAL HIGH (ref 70–99)
Potassium: 3.8 mmol/L (ref 3.5–5.1)
Sodium: 138 mmol/L (ref 135–145)

## 2022-12-01 LAB — POCT PREGNANCY, URINE: Preg Test, Ur: NEGATIVE

## 2022-12-01 SURGERY — DENTAL RESTORATION/EXTRACTIONS
Anesthesia: General | Site: Mouth

## 2022-12-01 MED ORDER — OXYCODONE HCL 5 MG PO TABS: 5.0000 mg | ORAL_TABLET | Freq: Once | ORAL | Status: AC | PRN

## 2022-12-01 MED ORDER — ORAL CARE MOUTH RINSE: 15.0000 mL | Freq: Once | OROMUCOSAL | Status: AC

## 2022-12-01 MED ORDER — ONDANSETRON HCL 4 MG/2ML IJ SOLN
INTRAMUSCULAR | Status: AC
  Administered 2022-12-01: 4 mg
  Filled 2022-12-01: qty 2

## 2022-12-01 MED ORDER — LIDOCAINE 2% (20 MG/ML) 5 ML SYRINGE
INTRAMUSCULAR | Status: DC | PRN
  Administered 2022-12-01: 100 mg via INTRAVENOUS

## 2022-12-01 MED ORDER — LABETALOL HCL 5 MG/ML IV SOLN
INTRAVENOUS | Status: DC | PRN
  Administered 2022-12-01: 5 mg via INTRAVENOUS

## 2022-12-01 MED ORDER — OXYMETAZOLINE HCL 0.05 % NA SOLN
NASAL | Status: AC
  Filled 2022-12-01: qty 30

## 2022-12-01 MED ORDER — ROCURONIUM BROMIDE 10 MG/ML (PF) SYRINGE
PREFILLED_SYRINGE | INTRAVENOUS | Status: DC | PRN
  Administered 2022-12-01: 40 mg via INTRAVENOUS

## 2022-12-01 MED ORDER — 0.9 % SODIUM CHLORIDE (POUR BTL) OPTIME
TOPICAL | Status: DC | PRN
  Administered 2022-12-01: 1000 mL

## 2022-12-01 MED ORDER — HYDROCODONE-ACETAMINOPHEN 5-325 MG PO TABS: 1.0000 | ORAL_TABLET | ORAL | 0 refills | Status: DC | PRN

## 2022-12-01 MED ORDER — OXYCODONE HCL 5 MG PO TABS
ORAL_TABLET | ORAL | Status: AC
  Administered 2022-12-01: 5 mg via ORAL
  Filled 2022-12-01: qty 1

## 2022-12-01 MED ORDER — ACETAMINOPHEN 500 MG PO TABS
1000.0000 mg | ORAL_TABLET | Freq: Once | ORAL | Status: AC
  Administered 2022-12-01: 1000 mg via ORAL
  Filled 2022-12-01: qty 2

## 2022-12-01 MED ORDER — ONDANSETRON HCL 4 MG/2ML IJ SOLN
INTRAMUSCULAR | Status: DC | PRN
  Administered 2022-12-01: 4 mg via INTRAVENOUS

## 2022-12-01 MED ORDER — MIDAZOLAM HCL 2 MG/2ML IJ SOLN
INTRAMUSCULAR | Status: AC
  Filled 2022-12-01: qty 2

## 2022-12-01 MED ORDER — LIDOCAINE-EPINEPHRINE 2 %-1:100000 IJ SOLN
INTRAMUSCULAR | Status: AC
  Filled 2022-12-01: qty 1

## 2022-12-01 MED ORDER — PROPOFOL 10 MG/ML IV BOLUS
INTRAVENOUS | Status: AC
  Filled 2022-12-01: qty 20

## 2022-12-01 MED ORDER — AMISULPRIDE (ANTIEMETIC) 5 MG/2ML IV SOLN
INTRAVENOUS | Status: AC
  Administered 2022-12-01: 10 mg via INTRAVENOUS
  Filled 2022-12-01: qty 4

## 2022-12-01 MED ORDER — LIDOCAINE-EPINEPHRINE 2 %-1:100000 IJ SOLN
INTRAMUSCULAR | Status: DC | PRN
  Administered 2022-12-01: 20 mL via INTRADERMAL

## 2022-12-01 MED ORDER — SODIUM CHLORIDE 0.9 % IR SOLN
Status: DC | PRN
  Administered 2022-12-01: 1000 mL

## 2022-12-01 MED ORDER — OXYCODONE HCL 5 MG/5ML PO SOLN: 5.0000 mg | Freq: Once | ORAL | Status: AC | PRN

## 2022-12-01 MED ORDER — CHLORHEXIDINE GLUCONATE 0.12 % MT SOLN
OROMUCOSAL | Status: AC
  Administered 2022-12-01: 15 mL via OROMUCOSAL
  Filled 2022-12-01: qty 15

## 2022-12-01 MED ORDER — DEXAMETHASONE SODIUM PHOSPHATE 10 MG/ML IJ SOLN
INTRAMUSCULAR | Status: DC | PRN
  Administered 2022-12-01: 10 mg via INTRAVENOUS

## 2022-12-01 MED ORDER — SUGAMMADEX SODIUM 200 MG/2ML IV SOLN
INTRAVENOUS | Status: DC | PRN
  Administered 2022-12-01: 450 mg via INTRAVENOUS

## 2022-12-01 MED ORDER — FENTANYL CITRATE (PF) 100 MCG/2ML IJ SOLN: 25.0000 ug | INTRAMUSCULAR | Status: DC | PRN

## 2022-12-01 MED ORDER — LACTATED RINGERS IV SOLN: INTRAVENOUS | Status: DC

## 2022-12-01 MED ORDER — MIDAZOLAM HCL 2 MG/2ML IJ SOLN
INTRAMUSCULAR | Status: DC | PRN
  Administered 2022-12-01: 2 mg via INTRAVENOUS

## 2022-12-01 MED ORDER — FENTANYL CITRATE (PF) 250 MCG/5ML IJ SOLN
INTRAMUSCULAR | Status: AC
  Filled 2022-12-01: qty 5

## 2022-12-01 MED ORDER — ONDANSETRON HCL 4 MG/2ML IJ SOLN: 4.0000 mg | Freq: Once | INTRAMUSCULAR | Status: DC

## 2022-12-01 MED ORDER — PHENYLEPHRINE HCL-NACL 20-0.9 MG/250ML-% IV SOLN
INTRAVENOUS | Status: AC
  Filled 2022-12-01: qty 500

## 2022-12-01 MED ORDER — AMOXICILLIN 500 MG PO CAPS: 500.0000 mg | ORAL_CAPSULE | Freq: Three times a day (TID) | ORAL | 0 refills | Status: DC

## 2022-12-01 MED ORDER — OXYMETAZOLINE HCL 0.05 % NA SOLN
NASAL | Status: DC | PRN
  Administered 2022-12-01: 2 via NASAL

## 2022-12-01 MED ORDER — AMISULPRIDE (ANTIEMETIC) 5 MG/2ML IV SOLN: 10.0000 mg | Freq: Once | INTRAVENOUS | Status: AC | PRN

## 2022-12-01 MED ORDER — FENTANYL CITRATE (PF) 250 MCG/5ML IJ SOLN
INTRAMUSCULAR | Status: DC | PRN
  Administered 2022-12-01: 100 ug via INTRAVENOUS

## 2022-12-01 MED ORDER — CHLORHEXIDINE GLUCONATE 0.12 % MT SOLN: 15.0000 mL | Freq: Once | OROMUCOSAL | Status: AC

## 2022-12-01 MED ORDER — CEFAZOLIN SODIUM-DEXTROSE 2-4 GM/100ML-% IV SOLN
2.0000 g | INTRAVENOUS | Status: AC
  Administered 2022-12-01: 2 g via INTRAVENOUS
  Filled 2022-12-01: qty 100

## 2022-12-01 MED ORDER — LABETALOL HCL 5 MG/ML IV SOLN
INTRAVENOUS | Status: AC
  Filled 2022-12-01: qty 4

## 2022-12-01 MED ORDER — PROPOFOL 10 MG/ML IV BOLUS
INTRAVENOUS | Status: DC | PRN
  Administered 2022-12-01 (×2): 50 mg via INTRAVENOUS
  Administered 2022-12-01: 200 mg via INTRAVENOUS

## 2022-12-01 SURGICAL SUPPLY — 36 items
BAG COUNTER SPONGE SURGICOUNT (BAG) IMPLANT
BAG SPNG CNTER NS LX DISP (BAG)
BLADE SURG 15 STRL LF DISP TIS (BLADE) ×1 IMPLANT
BLADE SURG 15 STRL SS (BLADE) ×1
BUR CROSS CUT FISSURE 1.6 (BURR) ×1 IMPLANT
BUR EGG ELITE 4.0 (BURR) ×1 IMPLANT
CANISTER SUCT 3000ML PPV (MISCELLANEOUS) ×1 IMPLANT
COVER SURGICAL LIGHT HANDLE (MISCELLANEOUS) ×1 IMPLANT
GAUZE PACKING FOLDED 2  STR (GAUZE/BANDAGES/DRESSINGS) ×1
GAUZE PACKING FOLDED 2 STR (GAUZE/BANDAGES/DRESSINGS) ×1 IMPLANT
GLOVE BIO SURGEON STRL SZ 6.5 (GLOVE) IMPLANT
GLOVE BIO SURGEON STRL SZ7 (GLOVE) IMPLANT
GLOVE BIO SURGEON STRL SZ8 (GLOVE) ×1 IMPLANT
GLOVE BIOGEL PI IND STRL 6.5 (GLOVE) IMPLANT
GLOVE BIOGEL PI IND STRL 7.0 (GLOVE) IMPLANT
GOWN STRL REUS W/ TWL LRG LVL3 (GOWN DISPOSABLE) ×1 IMPLANT
GOWN STRL REUS W/ TWL XL LVL3 (GOWN DISPOSABLE) ×1 IMPLANT
GOWN STRL REUS W/TWL LRG LVL3 (GOWN DISPOSABLE) ×1
GOWN STRL REUS W/TWL XL LVL3 (GOWN DISPOSABLE) ×1
IV NS 1000ML (IV SOLUTION) ×1
IV NS 1000ML BAXH (IV SOLUTION) ×1 IMPLANT
KIT BASIN OR (CUSTOM PROCEDURE TRAY) ×1 IMPLANT
KIT TURNOVER KIT B (KITS) ×1 IMPLANT
NDL HYPO 25GX1X1/2 BEV (NEEDLE) ×2 IMPLANT
NEEDLE HYPO 25GX1X1/2 BEV (NEEDLE) ×2 IMPLANT
NS IRRIG 1000ML POUR BTL (IV SOLUTION) ×1 IMPLANT
PAD ARMBOARD 7.5X6 YLW CONV (MISCELLANEOUS) ×1 IMPLANT
SLEEVE IRRIGATION ELITE 7 (MISCELLANEOUS) ×1 IMPLANT
SPIKE FLUID TRANSFER (MISCELLANEOUS) ×1 IMPLANT
SPONGE SURGIFOAM ABS GEL 12-7 (HEMOSTASIS) IMPLANT
SUT CHROMIC 3 0 PS 2 (SUTURE) ×1 IMPLANT
SYR BULB IRRIG 60ML STRL (SYRINGE) ×1 IMPLANT
SYR CONTROL 10ML LL (SYRINGE) ×1 IMPLANT
TRAY ENT MC OR (CUSTOM PROCEDURE TRAY) ×1 IMPLANT
TUBING IRRIGATION (MISCELLANEOUS) ×1 IMPLANT
YANKAUER SUCT BULB TIP NO VENT (SUCTIONS) ×1 IMPLANT

## 2022-12-01 NOTE — Transfer of Care (Signed)
Immediate Anesthesia Transfer of Care Note  Patient: Suzanne [name]  Procedure(s) Performed: EXTRACTION TEETH NUMBER ONE, SIXTEEN, SEVENTEEN, THIRTY TWO (Mouth)  Patient Location: PACU  Anesthesia Type:General  Level of Consciousness: awake, oriented, and drowsy  Airway & Oxygen Therapy: Patient Spontanous Breathing and Patient connected to nasal cannula oxygen  Post-op Assessment: Report given to RN and Post -op Vital signs reviewed and stable  Post vital signs: Reviewed and stable  Last Vitals:  Vitals Value Taken Time  BP 138/91 12/01/22 0842  Temp    Pulse 81 12/01/22 0844  Resp 19 12/01/22 0844  SpO2 93 % 12/01/22 0844  Vitals shown include unvalidated device data.  Last Pain:  Vitals:   12/01/22 0621  TempSrc: Oral  PainSc:       Patients Stated Pain Goal: 2 ([id number])  Complications: No notable events documented.

## 2022-12-01 NOTE — Anesthesia Procedure Notes (Signed)
Procedure Name: Intubation Date/Time: 12/01/2022 7:32 AM  Performed by: Anastasio Auerbach, CRNAPre-anesthesia Checklist: Patient identified, Emergency Drugs available, Suction available and Patient being monitored Patient Re-evaluated:Patient Re-evaluated prior to induction Oxygen Delivery Method: Circle system utilized Preoxygenation: Pre-oxygenation with 100% oxygen Induction Type: IV induction Ventilation: Mask ventilation without difficulty Laryngoscope Size: Glidescope and 3 Grade View: Grade I Tube type: Oral Nasal Tubes: Left Number of attempts: 1 Airway Equipment and Method: Stylet and Oral airway Placement Confirmation: ETT inserted through vocal cords under direct vision, positive ETCO2 and breath sounds checked- equal and bilateral Secured at: 22 cm Dental Injury: Teeth and Oropharynx as per pre-operative assessment  Difficulty Due To: Difficult Airway- due to large tongue Comments: Attempted nasal tube after usual dllation and passed easily through nare.  Unable to grasp tube with McGill's due to small mouth and large tongue.  Removed tube and ventilated.  Change to Oral ETT then intubated successfully

## 2022-12-01 NOTE — H&P (Signed)
H&P documentation  -History and Physical Reviewed  -Patient has been re-examined  -No change in the plan of care  Suzanne [name]  

## 2022-12-01 NOTE — Op Note (Signed)
12/01/2022  8:30 AM  PATIENT:  Suzanne [name]  37 y.o. female  PRE-OPERATIVE DIAGNOSIS:  IMPACTED TEETH # 1, 17, 32, NON-RESTORABLE TOOTH # 16 SECONDARY TO DENTAL CARIES  POST-OPERATIVE DIAGNOSIS:  SAME  PROCEDURE:  Procedure(s): EXTRACTION TEETH NUMBER ONE, SIXTEEN, SEVENTEEN, THIRTY TWO  SURGEON:  Surgeon(s): Diona Browner, DMD  ANESTHESIA:   local and general  EBL:  minimal  DRAINS: none   SPECIMEN:  No Specimen  COUNTS:  YES  PLAN OF CARE: Discharge to home after PACU  PATIENT DISPOSITION:  PACU - hemodynamically stable.   PROCEDURE DETAILS: Dictation # [id number]  Gae Bon, DMD 12/01/2022 8:30 AM

## 2022-12-01 NOTE — Anesthesia Postprocedure Evaluation (Signed)
Anesthesia Post Note  Patient: [place]  Procedure(s) Performed: EXTRACTION TEETH NUMBER ONE, SIXTEEN, SEVENTEEN, THIRTY TWO (Mouth)     Patient location during evaluation: PACU Anesthesia Type: General Level of consciousness: awake Pain management: pain level controlled Vital Signs Assessment: post-procedure vital signs reviewed and stable Respiratory status: spontaneous breathing, nonlabored ventilation and respiratory function stable Cardiovascular status: blood pressure returned to baseline and stable Postop Assessment: no apparent nausea or vomiting Anesthetic complications: no   No notable events documented.  Last Vitals:  Vitals:   12/01/22 0930 12/01/22 0945  BP: (!) 140/96 (!) 144/100  Pulse: 74 91  Resp: 20 20  Temp:  (!) 36.3 C  SpO2: 97% 97%    Last Pain:  Vitals:   12/01/22 0945  TempSrc:   PainSc: 7                  Nilda Simmer

## 2022-12-01 NOTE — Op Note (Signed)
NAME: Suzanne [name], Suzanne [name] MEDICAL RECORD NO: [id number] ACCOUNT NO: [id number] DATE OF BIRTH: [birth year] FACILITY: [facility] LOCATION: [facility] PHYSICIAN: Gae Bon, DDS  Operative Report   DATE OF PROCEDURE: 12/01/2022  PREOPERATIVE DIAGNOSIS:  Impacted teeth 1, 17 and 32, nonrestorable tooth #16 secondary to dental caries, super morbid obesity.  POSTOPERATIVE DIAGNOSIS:  Impacted teeth 1, 17 and 32, nonrestorable tooth #16 secondary to dental caries, super morbid obesity.  SURGEON:  Gae Bon, DDS  ANESTHESIA:  General.  Dr. Zenia Resides, attending.  DESCRIPTION OF PROCEDURE:  The patient was taken to the operating room and placed on the table in supine position.  General anesthesia was administered and an oral endotracheal tube was placed and secured.  A bite block was placed in the mouth.  A  timeout was performed and secured.  The eyes were protected.  The patient was draped for surgery.  Timeout was performed.  The posterior pharynx was suctioned and a throat pack was placed.  2% lidocaine 1:100,000 epinephrine was infiltrated in an  inferior alveolar block on the left side and in buccal and palatal infiltration around tooth #16.  A 15-blade was used to make an incision overlying tooth #17 carried forward to the embracer between 18 and 19 and this was reflected buccally and the flap  was reflected to expose the bone.  The Stryker handpiece with fissure bur was used under irrigation to remove bone to identify the outline of the wisdom tooth.  The tooth was sectioned and removed in pieces with 301 elevator.  The socket was curetted,  irrigated and closed with 3-0 chromic.  A 15 blade was then used to make an incision around tooth #16.  The periosteum was reflected with a periosteal elevator.  Bone was removed with the Stryker handpiece and then the tooth was elevated and removed with  the dental forceps.  The socket was curetted, irrigated and closed with 3-0 chromic.  Then, the throat pack  was removed.  The endotracheal tube was repositioned to the left side of the mouth and secured there and then the throat pack was replaced in the  mouth.  A 15 blade was used to make an incision overlying tooth #32 and overlying tooth #1.  The periosteum was reflected around tooth #32.  The Stryker handpiece was used to remove bone laterally and circumferentially to expose the tooth.  The tooth  could not be elevated. The tooth was then sectioned with a Stryker handpiece fissure bur under irrigation and then the tooth was removed in pieces.  The socket was curetted, irrigated and closed with 3-0 chromic.  Then, the maxillary flap was reflected  laterally and bone was exposed.  The Stryker handpiece was used to remove bone overlying tooth number 1.  So that the tooth could be identified.  It was superior and distal completely surrounded by bone.  Bone was removed and the tooth was elevated with  the 301 elevator and with Administrator.  The tooth could not be removed, eventually it was removed somewhat with the 301 elevator and sectioned and then more bone was removed with Stryker handpiece and the tooth was grasped with dental forceps and  rongeurs and could not be removed and eventually loosened and came out.  Then, the area was irrigated and closed with 3-0 chromic.  The oral cavity was then irrigated and suctioned.  The throat pack was removed.  The patient was left under care of  anesthesia for transport to recovery and discharged  home through day surgery.  ESTIMATED BLOOD LOSS:  Minimum.  COMPLICATIONS:  None.  SPECIMENS:  None.  COUNTS:  Correct.   PUS D: 12/01/2022 8:34:51 am T: 12/01/2022 9:48:00 am  JOB: [id number]

## 2022-12-02 ENCOUNTER — Encounter (HOSPITAL_COMMUNITY): Payer: Self-pay | Admitting: Oral Surgery

## 2022-12-27 ENCOUNTER — Encounter: Payer: Self-pay | Admitting: Primary Care

## 2022-12-27 ENCOUNTER — Ambulatory Visit: Payer: No Typology Code available for payment source | Admitting: Primary Care

## 2022-12-27 VITALS — BP 118/84 | HR 102 | Temp 99.1°F | Ht 59.0 in | Wt 260.0 lb

## 2022-12-27 DIAGNOSIS — E559 Vitamin D deficiency, unspecified: Secondary | ICD-10-CM

## 2022-12-27 DIAGNOSIS — Z30011 Encounter for initial prescription of contraceptive pills: Secondary | ICD-10-CM

## 2022-12-27 DIAGNOSIS — D509 Iron deficiency anemia, unspecified: Secondary | ICD-10-CM

## 2022-12-27 DIAGNOSIS — R5382 Chronic fatigue, unspecified: Secondary | ICD-10-CM | POA: Diagnosis not present

## 2022-12-27 DIAGNOSIS — K219 Gastro-esophageal reflux disease without esophagitis: Secondary | ICD-10-CM | POA: Diagnosis not present

## 2022-12-27 DIAGNOSIS — I10 Essential (primary) hypertension: Secondary | ICD-10-CM

## 2022-12-27 DIAGNOSIS — R7303 Prediabetes: Secondary | ICD-10-CM | POA: Diagnosis not present

## 2022-12-27 LAB — IBC + FERRITIN
Ferritin: 11.3 ng/mL (ref 10.0–291.0)
Iron: 30 ug/dL — ABNORMAL LOW (ref 42–145)
Saturation Ratios: 8.1 % — ABNORMAL LOW (ref 20.0–50.0)
TIBC: 371 ug/dL (ref 250.0–450.0)
Transferrin: 265 mg/dL (ref 212.0–360.0)

## 2022-12-27 LAB — COMPREHENSIVE METABOLIC PANEL
ALT: 11 U/L (ref 0–35)
AST: 11 U/L (ref 0–37)
Albumin: 3.8 g/dL (ref 3.5–5.2)
Alkaline Phosphatase: 97 U/L (ref 39–117)
BUN: 10 mg/dL (ref 6–23)
CO2: 27 mEq/L (ref 19–32)
Calcium: 9.2 mg/dL (ref 8.4–10.5)
Chloride: 105 mEq/L (ref 96–112)
Creatinine, Ser: 0.6 mg/dL (ref 0.40–1.20)
GFR: 117.12 mL/min (ref >60.00)
Glucose, Bld: 105 mg/dL — ABNORMAL HIGH (ref 70–99)
Potassium: 4.1 mEq/L (ref 3.5–5.1)
Sodium: 138 mEq/L (ref 135–145)
Total Bilirubin: 0.3 mg/dL (ref 0.2–1.2)
Total Protein: 6.8 g/dL (ref 6.0–8.3)

## 2022-12-27 LAB — CBC
HCT: 34.5 % — ABNORMAL LOW (ref 36.0–46.0)
Hemoglobin: 10.8 g/dL — ABNORMAL LOW (ref 12.0–15.0)
MCHC: 31.2 g/dL (ref 30.0–36.0)
MCV: 68.6 fl — ABNORMAL LOW (ref 78.0–100.0)
Platelets: 333 10*3/uL (ref 150.0–400.0)
RBC: 5.04 Mil/uL (ref 3.87–5.11)
RDW: 17.4 % — ABNORMAL HIGH (ref 11.5–15.5)
WBC: 7.9 10*3/uL (ref 4.0–10.5)

## 2022-12-27 LAB — VITAMIN B12: Vitamin B-12: 274 pg/mL (ref 211–911)

## 2022-12-27 LAB — VITAMIN D 25 HYDROXY (VIT D DEFICIENCY, FRACTURES): VITD: 8.93 ng/mL — ABNORMAL LOW (ref 30.00–100.00)

## 2022-12-27 LAB — POCT URINE PREGNANCY: Preg Test, Ur: NEGATIVE

## 2022-12-27 LAB — HEMOGLOBIN A1C: Hgb A1c MFr Bld: 6 % (ref 4.6–6.5)

## 2022-12-27 MED ORDER — NORETHIN ACE-ETH ESTRAD-FE 1-20 MG-MCG PO TABS: 1.0000 | ORAL_TABLET | Freq: Every day | ORAL | 3 refills | Status: DC

## 2022-12-27 NOTE — Assessment & Plan Note (Signed)
Repeat A1C pending. 

## 2022-12-27 NOTE — Assessment & Plan Note (Signed)
Will initiate OCP's per patient request.  Urine pregnancy test negative today. Discussed instructions for use of OCP's. She is familiar.

## 2022-12-27 NOTE — Progress Notes (Signed)
Subjective:    Patient ID: Suzanne [name], female    DOB: [birth year], 37 y.o.   MRN: [id number]  HPI  Suzanne [name] is a very pleasant 37 y.o. female with a history of hypertension, acute CHF, morbid obesity, who presents today for follow up and to discuss birth control options.  1) Hypertension: Currently managed on nifedipine 30 mg daily. She denies headaches, dizziness, chest pain. She is no longer taking HCTZ.   BP Readings from Last 3 Encounters:  12/27/22 118/84  12/01/22 (!) 144/100  03/29/22 122/76    2) GERD: Currently managed on pantoprazole 20 mg daily. Overall feels well managed that regimen, does have intermittent breakthrough GERD symptoms. Overall infrequently.   3) Iron Deficiency Anemia: Also sickle cell trait and alpha thalassemia silent carrier. Previously taking oral iron, has not been taking for about 1 year.   She has noticed feeling lower energy levels over the last several months. She will notice heavy menses for the first 2 days of her cycle and will lighten up for the last 2 days. She denies rectal bleeding.  4) Birth Control: Not currently managed on treatment. Previously managed on Depo Provera. She would like to try OCP's for pregnancy prevention.  She's unsure of her LMP. She is sexually active, uses condoms.    Review of Systems  Constitutional:  Positive for fatigue.  Respiratory:  Negative for shortness of breath.   Cardiovascular:  Negative for chest pain.  Gastrointestinal:  Negative for blood in stool.  Neurological:  Negative for dizziness and headaches.         Past Medical History:  Diagnosis Date   Acid reflux    Blood transfusion without reported diagnosis    CHF (congestive heart failure)    History of C-section 02/02/2021   For fetal intolerance of labor C/S x1 Desires VBAC   Hypertension    Morbid obesity 02/12/2021   Postpartum care following cesarean delivery 08/10/2021   Sickle cell trait     Social History    Socioeconomic History   Marital status: Single    Spouse name: Not on file   Number of children: Not on file   Years of education: Not on file   Highest education level: Some college, no degree  Occupational History   Not on file  Tobacco Use   Smoking status: Never   Smokeless tobacco: Never  Vaping Use   Vaping Use: Never used  Substance and Sexual Activity   Alcohol use: No   Drug use: No   Sexual activity: Not Currently    Partners: Male    Birth control/protection: None  Other Topics Concern   Not on file  Social History Narrative   Completed some college   Works at [employer]   Has one son, 7 years.   Enjoys playing with her son, sleeping.   Family is established here as well.   Social Determinants of Health   Financial Resource Strain: Low Risk  (12/26/2022)   Overall Financial Resource Strain (CARDIA)    Difficulty of Paying Living Expenses: Not hard at all  Food Insecurity: No Food Insecurity (12/26/2022)   Hunger Vital Sign    Worried About Running Out of Food in the Last Year: Never true    Ran Out of Food in the Last Year: Never true  Transportation Needs: No Transportation Needs (12/26/2022)   PRAPARE - Hydrologist (Medical): No    Lack  of Transportation (Non-Medical): No  Physical Activity: Unknown (12/26/2022)   Exercise Vital Sign    Days of Exercise per Week: Patient declined    Minutes of Exercise per Session: Not on file  Stress: No Stress Concern Present (12/26/2022)   [place]    Feeling of Stress : Not at all  Social Connections: Moderately Integrated (12/26/2022)   Social Connection and Isolation Panel [NHANES]    Frequency of Communication with Friends and Family: More than three times a week    Frequency of Social Gatherings with Friends and Family: Once a week    Attends Religious Services: 1 to 4 times per year    Active Member of  [employer] or Organizations: Yes    Attends Archivist Meetings: Patient declined    Marital Status: Never married  Intimate Partner Violence: Not At Risk (02/12/2021)   Humiliation, Afraid, Rape, and Kick questionnaire    Fear of Current or Ex-Partner: No    Emotionally Abused: No    Physically Abused: No    Sexually Abused: No    Past Surgical History:  Procedure Laterality Date   CESAREAN SECTION N/A 10/08/2013   Procedure: CESAREAN SECTION;  Surgeon: Lahoma Crocker, MD;  Location: [facility];  Service: Obstetrics;  Laterality: N/A;   CESAREAN SECTION N/A 07/10/2021   Procedure: CESAREAN SECTION;  Surgeon: Florian Buff, MD;  Location: [facility];  Service: Obstetrics;  Laterality: N/A;   TOOTH EXTRACTION N/A 12/01/2022   Procedure: EXTRACTION TEETH NUMBER ONE, SIXTEEN, SEVENTEEN, THIRTY TWO;  Surgeon: Diona Browner, DMD;  Location: [facility];  Service: Oral Surgery;  Laterality: N/A;    Family History  Problem Relation Age of Onset   Diabetes Mother    Arthritis Mother    Hypertension Mother    Diabetes Maternal Grandmother    Hypertension Maternal Grandmother    Cancer Neg Hx    Heart disease Neg Hx     Allergies  Allergen Reactions   Hydralazine Itching   Lasix [Furosemide] Swelling    Mouth and right side of face with oral furosemide. Pt can tolerate IV form     Current Outpatient Medications on File Prior to Visit  Medication Sig Dispense Refill   ibuprofen (ADVIL) 600 MG tablet TAKE 1 TABLET BY MOUTH EVERY 6 HOURS 30 tablet 11   NIFEdipine (ADALAT CC) 30 MG 24 hr tablet TAKE 1 TABLET (30 MG TOTAL) BY MOUTH DAILY. FOR BLOOD PRESSURE. 90 tablet 1   pantoprazole (PROTONIX) 20 MG tablet TAKE 1 TABLET (20 MG TOTAL) BY MOUTH DAILY. FOR HEARTBURN. 90 tablet 1   fluticasone (FLONASE) 50 MCG/ACT nasal spray PLACE 1 SPRAY INTO BOTH NOSTRILS 2 (TWO) TIMES DAILY (Patient not taking: Reported on 11/28/2022) 48 mL 0   Semaglutide-Weight Management (WEGOVY) 0.25 MG/0.5ML SOAJ  Inject 0.25 mg into the skin once a week. (Patient not taking: Reported on 12/27/2022) 2 mL 0   [DISCONTINUED] hydrochlorothiazide (HYDRODIURIL) 12.5 MG tablet TAKE 1 TABLET BY MOUTH EVERY DAY FOR BLOOD PRESSURE (Patient not taking: Reported on 12/10/2020) 90 tablet 0   No current facility-administered medications on file prior to visit.    BP 118/84   Pulse (!) 102   Temp 99.1 F (37.3 C) (Temporal)   Ht 4\' 11"  (1.499 m)   Wt 260 lb (117.9 kg)   LMP 11/27/2022 (Approximate)   SpO2 98%   Breastfeeding No   BMI 52.51 kg/m  Objective:   Physical Exam  Cardiovascular:     Rate and Rhythm: Normal rate and regular rhythm.  Pulmonary:     Effort: Pulmonary effort is normal.     Breath sounds: Normal breath sounds.  Musculoskeletal:     Cervical back: Neck supple.  Skin:    General: Skin is warm and dry.  Neurological:     Mental Status: She is alert and oriented to person, place, and time.  Psychiatric:        Mood and Affect: Mood normal.           Assessment & Plan:  Essential hypertension Assessment & Plan: Controlled.  Continue nifedipine 30 mg daily.    Gastroesophageal reflux disease without esophagitis Assessment & Plan: Overall controlled.  Continue pantoprazole 20 mg daily.   Encounter for initial prescription of contraceptive pills Assessment & Plan: Will initiate OCP's per patient request.  Urine pregnancy test negative today. Discussed instructions for use of OCP's. She is familiar.     Orders: -     POCT urine pregnancy -     Norethin Ace-Eth Estrad-FE; Take 1 tablet by mouth daily.  Dispense: 84 tablet; Refill: 3  Iron deficiency anemia, unspecified iron deficiency anemia type Assessment & Plan: Not on treatment.  Checking iron panel, vitamin B12 and D, CBC.  Orders: -     CBC -     Comprehensive metabolic panel -     IBC + Ferritin  Prediabetes Assessment & Plan: Repeat A1C pending.  Orders: -     Comprehensive metabolic  panel -     Hemoglobin A1c  Chronic fatigue Assessment & Plan: Checking iron studies, vitamin levels today. TSH historically within normal range.   Orders: -     CBC -     IBC + Ferritin -     Vitamin B12 -     VITAMIN D 25 Hydroxy (Vit-D Deficiency, Fractures)        [name], NP

## 2022-12-27 NOTE — Assessment & Plan Note (Signed)
Overall controlled.  Continue pantoprazole 20 mg daily.

## 2022-12-27 NOTE — Assessment & Plan Note (Signed)
Checking iron studies, vitamin levels today. TSH historically within normal range.

## 2022-12-27 NOTE — Assessment & Plan Note (Signed)
Controlled.  Continue nifedipine 30 mg daily. 

## 2022-12-27 NOTE — Assessment & Plan Note (Signed)
Not on treatment.  Checking iron panel, vitamin B12 and D, CBC.

## 2022-12-27 NOTE — Patient Instructions (Signed)
Stop by the lab prior to leaving today. I will notify you of your results once received.   Take your first birth control pill on the Sunday after your period starts. For example, if you start your period on Monday, then take the birth control pill that following Sunday. If you start your period on Sunday, then take your first pill that same day.  You must take your pill at the same time each day.  If you miss a pill then take the pill as soon as you remember, and also take your pill at the regularly scheduled time, even if this means taking two pills in one day. If you miss more than two pills consecutively, then please call me.  It may take three to six months for birth control pills to regulate your cycle and improve symptoms.  It was a pleasure to see you today!  

## 2022-12-28 MED ORDER — VITAMIN D (ERGOCALCIFEROL) 1.25 MG (50000 UNIT) PO CAPS: ORAL_CAPSULE | ORAL | 0 refills | Status: DC

## 2023-02-12 ENCOUNTER — Other Ambulatory Visit: Payer: Self-pay | Admitting: Obstetrics

## 2023-02-12 ENCOUNTER — Other Ambulatory Visit: Payer: Self-pay | Admitting: Primary Care

## 2023-02-12 DIAGNOSIS — I10 Essential (primary) hypertension: Secondary | ICD-10-CM

## 2023-02-12 DIAGNOSIS — K219 Gastro-esophageal reflux disease without esophagitis: Secondary | ICD-10-CM

## 2023-03-17 ENCOUNTER — Other Ambulatory Visit: Payer: Self-pay | Admitting: Primary Care

## 2023-03-17 DIAGNOSIS — E559 Vitamin D deficiency, unspecified: Secondary | ICD-10-CM

## 2023-03-18 NOTE — Telephone Encounter (Signed)
Patient needs lab only appointment scheduled to recheck vitamin D levels.  Please schedule, thanks!

## 2023-03-19 NOTE — Telephone Encounter (Signed)
Patient scheduled.

## 2023-03-20 ENCOUNTER — Other Ambulatory Visit: Payer: No Typology Code available for payment source

## 2023-07-26 ENCOUNTER — Ambulatory Visit: Payer: No Typology Code available for payment source | Admitting: Primary Care

## 2023-07-27 ENCOUNTER — Encounter: Payer: Self-pay | Admitting: *Deleted

## 2023-07-27 ENCOUNTER — Ambulatory Visit (INDEPENDENT_AMBULATORY_CARE_PROVIDER_SITE_OTHER): Payer: No Typology Code available for payment source | Admitting: Primary Care

## 2023-07-27 ENCOUNTER — Encounter: Payer: Self-pay | Admitting: Primary Care

## 2023-07-27 VITALS — BP 132/84 | HR 95 | Temp 97.7°F | Ht 59.0 in | Wt 277.0 lb

## 2023-07-27 DIAGNOSIS — Z30013 Encounter for initial prescription of injectable contraceptive: Secondary | ICD-10-CM

## 2023-07-27 DIAGNOSIS — K429 Umbilical hernia without obstruction or gangrene: Secondary | ICD-10-CM | POA: Insufficient documentation

## 2023-07-27 DIAGNOSIS — R051 Acute cough: Secondary | ICD-10-CM | POA: Diagnosis not present

## 2023-07-27 LAB — POCT URINE PREGNANCY: Preg Test, Ur: NEGATIVE

## 2023-07-27 MED ORDER — BENZONATATE 200 MG PO CAPS: 200.0000 mg | ORAL_CAPSULE | Freq: Three times a day (TID) | ORAL | 0 refills | Status: DC | PRN

## 2023-07-27 MED ORDER — MEDROXYPROGESTERONE ACETATE 150 MG/ML IM SUSP
150.0000 mg | INTRAMUSCULAR | Status: AC
  Administered 2023-07-27: 150 mg via INTRAMUSCULAR

## 2023-07-27 NOTE — Patient Instructions (Addendum)
You will either be contacted via phone regarding your referral to general surgery, or you may receive a letter on your MyChart portal from our referral team with instructions for scheduling an appointment. Please let us know if you have not been contacted by anyone within two weeks.  Return for your next injection as discussed.  You may take Benzonatate capsules for cough. Take 1 capsule by mouth three times daily as needed for cough.  Please schedule a physical to meet with me in mid April 2025.  It was a pleasure to see you today!

## 2023-07-27 NOTE — Assessment & Plan Note (Signed)
Symptoms today suggestive of viral etiology.  Respiratory exam overall reassuring.  Prescription for Tessalon Perles 200 mg provided.  Take 1 tablet by mouth 3 times daily as needed. Return precautions provided.

## 2023-07-27 NOTE — Addendum Note (Signed)
Addended by: Lonia Blood on: 07/27/2023 09:37 AM   Modules accepted: Orders

## 2023-07-27 NOTE — Progress Notes (Signed)
Subjective:    Patient ID: Suzanne [name], female    DOB: July 25, 1986, 37 y.o.   MRN: [id number]  HPI  Suzanne [name] is a very pleasant 37 y.o. female with a history of hypertension, CHF, morbid obesity, iron deficiency anemia, prediabetes who presents today to discuss birth control options, acute cough, and referral for hernias.  1) Birth Control: Initiated on [facility] OCPs in April 2024 for pregnancy prevention.  Today she discusses that she never started as she doesn't want to have to remember to take them at the same time of day.   She was once managed on Depo Provera injection which was easy. She tried to get the IUD in the past but her GYN was unable to successfully insert due to the anatomy of her uterus.  She has also tried the patches.  She is not interested in the NuvaRing or Nexplanon.  She prefers Depo-Provera.  LMP was 3 weeks ago. Menses occurs once monthly, lasts 3-4 days, 1-2 days are heavy with clots.   Wt Readings from Last 3 Encounters:  07/27/23 277 lb (125.6 kg)  12/27/22 260 lb (117.9 kg)  12/01/22 252 lb (114.3 kg)   2) Acute Cough: Symptom onset 4 days ago with nasal congestion, postnasal drip, cough.  Her son was sick with the same symptoms previously.  Overall she is feeling better except for her cough.  She has tried Robitussin OTC without improvement.  She has not tested for COVID-19 infection but does not believe she has COVID.  3) Abdominal Hernia: Discovered in August 2023 per CT scan which revealed:  "Broad-based supraumbilical protrusion of mesenteric fat and small bowel loops measuring 9.2 cm in diameter. At the umbilicus, there is focal herniation of mesenteric fat measuring 4.9 x 3.6 cm. Just subjacent to the skin at the umbilicus, there is a 2.6 x 2.4 cm soft tissue nodule. This is nonspecific."  She was referred to general surgery in August 2023 but she never connected as she never received a phone call.  She would like to be referred to discuss  fixing hernias as they are uncomfortable.   Review of Systems  Constitutional:  Negative for chills, fatigue and fever.  HENT:  Positive for congestion and postnasal drip. Negative for ear pain and sore throat.   Respiratory:  Positive for cough. Negative for shortness of breath.   Gastrointestinal:  Positive for abdominal pain.  Neurological:  Negative for headaches.         Past Medical History:  Diagnosis Date   Acid reflux    Blood transfusion without reported diagnosis    CHF (congestive heart failure) (HCC)    History of C-section 02/02/2021   For fetal intolerance of labor C/S x1 Desires VBAC   Hypertension    Morbid obesity (HCC) 02/12/2021   Postpartum care following cesarean delivery 08/10/2021   Sickle cell trait (HCC)     Social History   Socioeconomic History   Marital status: Single    Spouse name: Not on file   Number of children: Not on file   Years of education: Not on file   Highest education level: Some college, no degree  Occupational History   Not on file  Tobacco Use   Smoking status: Never   Smokeless tobacco: Never  Vaping Use   Vaping status: Never Used  Substance and Sexual Activity   Alcohol use: No   Drug use: No   Sexual activity: Not Currently    Partners: Male  Birth control/protection: None  Other Topics Concern   Not on file  Social History Narrative   Completed some college   Works at [employer]   From [place]   Has one son, 7 years.   Enjoys playing with her son, sleeping.   Family is established here as well.   Social Determinants of Health   Financial Resource Strain: Low Risk  (07/26/2023)   Overall Financial Resource Strain (CARDIA)    Difficulty of Paying Living Expenses: Not hard at all  Food Insecurity: No Food Insecurity (07/26/2023)   Hunger Vital Sign    Worried About Running Out of Food in the Last Year: Never true    Ran Out of Food in the Last Year: Never true  Transportation Needs: No  Transportation Needs (07/26/2023)   PRAPARE - Administrator, Civil Service (Medical): No    Lack of Transportation (Non-Medical): No  Physical Activity: Unknown (07/26/2023)   Exercise Vital Sign    Days of Exercise per Week: 0 days    Minutes of Exercise per Session: Not on file  Stress: Patient Declined (07/26/2023)   [employer] of Occupational Health - Occupational Stress Questionnaire    Feeling of Stress : Patient declined  Social Connections: Unknown (07/26/2023)   Social Connection and Isolation Panel [NHANES]    Frequency of Communication with Friends and Family: More than three times a week    Frequency of Social Gatherings with Friends and Family: Twice a week    Attends Religious Services: More than 4 times per year    Active Member of [employer] or Organizations: No    Attends Banker Meetings: Patient declined    Marital Status: Patient declined  Intimate Partner Violence: Unknown (12/30/2021)   Received from [facility], [facility]   HITS    Physically Hurt: Not on file    Insult or Talk Down To: Not on file    Threaten Physical Harm: Not on file    Scream or Curse: Not on file    Past Surgical History:  Procedure Laterality Date   CESAREAN SECTION N/A 10/08/2013   Procedure: CESAREAN SECTION;  Surgeon: Antionette Char, MD;  Location: [facility];  Service: Obstetrics;  Laterality: N/A;   CESAREAN SECTION N/A 07/10/2021   Procedure: CESAREAN SECTION;  Surgeon: [name], MD;  Location: [facility];  Service: Obstetrics;  Laterality: N/A;   TOOTH EXTRACTION N/A 12/01/2022   Procedure: EXTRACTION TEETH NUMBER ONE, SIXTEEN, SEVENTEEN, THIRTY TWO;  Surgeon: Ocie Doyne, DMD;  Location: [facility];  Service: Oral Surgery;  Laterality: N/A;    Family History  Problem Relation Age of Onset   Diabetes Mother    Arthritis Mother    Hypertension Mother    Diabetes Maternal Grandmother    Hypertension Maternal Grandmother    Cancer Neg Hx     Heart disease Neg Hx     Allergies  Allergen Reactions   Hydralazine Itching   Lasix [Furosemide] Swelling    Mouth and right side of face with oral furosemide. Pt can tolerate IV form     Current Outpatient Medications on File Prior to Visit  Medication Sig Dispense Refill   ibuprofen (ADVIL) 600 MG tablet TAKE 1 TABLET BY MOUTH EVERY 6 HOURS 30 tablet 11   NIFEdipine (ADALAT CC) 30 MG 24 hr tablet TAKE 1 TABLET (30 MG TOTAL) BY MOUTH DAILY. FOR BLOOD PRESSURE. 90 tablet 2   pantoprazole (PROTONIX) 20 MG tablet TAKE 1 TABLET (  20 MG TOTAL) BY MOUTH DAILY. FOR HEARTBURN. 90 tablet 2   fluticasone (FLONASE) 50 MCG/ACT nasal spray PLACE 1 SPRAY INTO BOTH NOSTRILS 2 (TWO) TIMES DAILY (Patient not taking: Reported on 11/28/2022) 48 mL 0   norethindrone-ethinyl estradiol-FE (JUNEL FE 1/20) 1-20 MG-MCG tablet Take 1 tablet by mouth daily. (Patient not taking: Reported on 07/27/2023) 84 tablet 3   Semaglutide-Weight Management (WEGOVY) 0.25 MG/0.5ML SOAJ Inject 0.25 mg into the skin once a week. (Patient not taking: Reported on 12/27/2022) 2 mL 0   Vitamin D, Ergocalciferol, (DRISDOL) 1.25 MG (50000 UNIT) CAPS capsule Take 1 capsule by mouth once weekly for 12 weeks. (Patient not taking: Reported on 07/27/2023) 12 capsule 0   [DISCONTINUED] hydrochlorothiazide (HYDRODIURIL) 12.5 MG tablet TAKE 1 TABLET BY MOUTH EVERY DAY FOR BLOOD PRESSURE (Patient not taking: Reported on 12/10/2020) 90 tablet 0   No current facility-administered medications on file prior to visit.    BP 132/84   Pulse 95   Temp 97.7 F (36.5 C) (Temporal)   Ht 4\' 11"  (1.499 m)   Wt 277 lb (125.6 kg)   LMP 07/09/2023 (Approximate)   SpO2 97%   BMI 55.95 kg/m  Objective:   Physical Exam Cardiovascular:     Rate and Rhythm: Normal rate and regular rhythm.  Pulmonary:     Effort: Pulmonary effort is normal.     Breath sounds: Normal breath sounds. No wheezing or rhonchi.     Comments: Dry cough noted a few times during  exam Musculoskeletal:     Cervical back: Neck supple.  Skin:    General: Skin is warm and dry.  Neurological:     Mental Status: She is alert and oriented to person, place, and time.  Psychiatric:        Mood and Affect: Mood normal.           Assessment & Plan:  Umbilical hernia without obstruction and without gangrene Assessment & Plan: Reviewed CT scan from August 2023. Referral placed to general surgery again.  Discussed that she will receive notification via MyChart regarding information to schedule an appointment.  Orders: -     Ambulatory referral to General Surgery  Encounter for initial prescription of injectable contraceptive Assessment & Plan: Discussed options for birth control aside from OCPs, she elects for Depo-Provera as she did well on it previously.   We discussed the risks for weight gain as her BMI is already 55. She would like to proceed.  Urine pregnancy test negative today. Depo-Provera injection provided today.  Schedule provided to patient for follow-up and subsequent injections.  Discussed to be cognizant of potential additional weight gain and food cravings.  Recommended weight loss. Discussed to use backup protection against pregnancy for at least 7 days.   Orders: -     POCT urine pregnancy  Acute cough Assessment & Plan: Symptoms today suggestive of viral etiology.  Respiratory exam overall reassuring.  Prescription for Tessalon Perles 200 mg provided.  Take 1 tablet by mouth 3 times daily as needed. Return precautions provided.  Orders: -     Benzonatate; Take 1 capsule (200 mg total) by mouth 3 (three) times daily as needed for cough.  Dispense: 15 capsule; Refill: 0        Doreene Nest, NP

## 2023-07-27 NOTE — Assessment & Plan Note (Addendum)
Discussed options for birth control aside from OCPs, she elects for Depo-Provera as she did well on it previously.   We discussed the risks for weight gain as her BMI is already 55. She would like to proceed.  Urine pregnancy test negative today. Depo-Provera injection provided today.  Schedule provided to patient for follow-up and subsequent injections.  Discussed to be cognizant of potential additional weight gain and food cravings.  Recommended weight loss. Discussed to use backup protection against pregnancy for at least 7 days.

## 2023-07-27 NOTE — Assessment & Plan Note (Signed)
Reviewed CT scan from August 2023. Referral placed to general surgery again.  Discussed that she will receive notification via MyChart regarding information to schedule an appointment.

## 2023-08-27 ENCOUNTER — Encounter (HOSPITAL_COMMUNITY): Payer: Self-pay | Admitting: Emergency Medicine

## 2023-08-27 ENCOUNTER — Ambulatory Visit (HOSPITAL_COMMUNITY)
Admission: EM | Admit: 2023-08-27 | Discharge: 2023-08-27 | Disposition: A | Discharge status: Home / Self Care | Payer: Medicaid Other | Attending: Internal Medicine | Admitting: Internal Medicine

## 2023-08-27 DIAGNOSIS — J0140 Acute pansinusitis, unspecified: Secondary | ICD-10-CM

## 2023-08-27 MED ORDER — PROMETHAZINE-DM 6.25-15 MG/5ML PO SYRP: 5.0000 mL | ORAL_SOLUTION | Freq: Three times a day (TID) | ORAL | 0 refills | Status: DC | PRN

## 2023-08-27 MED ORDER — AMOXICILLIN 875 MG PO TABS: 875.0000 mg | ORAL_TABLET | Freq: Two times a day (BID) | ORAL | 0 refills | Status: DC

## 2023-08-27 NOTE — ED Triage Notes (Signed)
Pt c/o cough for 4weeks. Developed congestion Wednesday.

## 2023-08-27 NOTE — ED Provider Notes (Signed)
[facility]    CSN: [id number] Arrival date & time: 08/27/23  0803      History   Chief Complaint Chief Complaint  Patient presents with   Cough    HPI Suzanne [name] is a 37 y.o. female.  Patient presents for evaluation of cough x 4 weeks.  She is now complaining of severe nasal congestion, facial pressure.  She reports her child was sick with a similar course of illness 2 weeks ago and her symptoms have progressively worsened.  She has not taken anything for symptoms today.  She denies fever.  Denies any wheezing or chest pain.  Past Medical History:  Diagnosis Date   Acid reflux    Blood transfusion without reported diagnosis    CHF (congestive heart failure) (HCC)    History of C-section 02/02/2021   For fetal intolerance of labor C/S x1 Desires VBAC   Hypertension    Morbid obesity (HCC) 02/12/2021   Postpartum care following cesarean delivery 08/10/2021   Sickle cell trait [facility])     Patient Active Problem List   Diagnosis Date Noted   Umbilical hernia 07/27/2023   Acute cough 07/27/2023   Iron deficiency anemia 12/27/2022   Prediabetes 12/27/2022   Chronic fatigue 12/27/2022   Encounter for annual general medical examination with abnormal findings in adult 03/29/2022   Chronic diastolic HF (heart failure) (HCC) 09/22/2021   Contraception management 09/16/2021   Hyperglycemia 09/01/2021   Alpha thalassemia silent carrier 02/23/2021   Heart failure with preserved ejection fraction (HCC) 02/12/2021   Sickle cell trait (HCC)    Acid reflux    Morbid obesity with BMI of 50.0-59.9, adult (HCC) 01/14/2016   Essential hypertension 12/17/2014   Acute diastolic CHF (congestive heart failure) (HCC) 10/18/2013   Ptyalism 04/11/2013    Past Surgical History:  Procedure Laterality Date   CESAREAN SECTION N/A 10/08/2013   Procedure: CESAREAN SECTION;  Surgeon: Antionette Char, MD;  Location: [facility];  Service: Obstetrics;  Laterality: N/A;   CESAREAN  SECTION N/A 07/10/2021   Procedure: CESAREAN SECTION;  Surgeon: [name], MD;  Location: [facility];  Service: Obstetrics;  Laterality: N/A;   TOOTH EXTRACTION N/A 12/01/2022   Procedure: EXTRACTION TEETH NUMBER ONE, SIXTEEN, SEVENTEEN, THIRTY TWO;  Surgeon: Ocie Doyne, DMD;  Location: [facility];  Service: Oral Surgery;  Laterality: N/A;    OB History     Gravida  3   Para  2   Term  2   Preterm      AB  1   Living  2      SAB      IAB  1   Ectopic      Multiple  0   Live Births  2            Home Medications    Prior to Admission medications   Medication Sig Start Date End Date Taking? Authorizing Provider  amoxicillin (AMOXIL) 875 MG tablet Take 1 tablet (875 mg total) by mouth 2 (two) times daily. 08/27/23  Yes Bing Neighbors, NP  promethazine-dextromethorphan (PROMETHAZINE-DM) 6.25-15 MG/5ML syrup Take 5 mLs by mouth 3 (three) times daily as needed for cough. 08/27/23  Yes Bing Neighbors, NP  benzonatate (TESSALON) 200 MG capsule Take 1 capsule (200 mg total) by mouth 3 (three) times daily as needed for cough. 07/27/23   Doreene Nest, NP  fluticasone (FLONASE) 50 MCG/ACT nasal spray PLACE 1 SPRAY INTO BOTH NOSTRILS 2 (TWO)  TIMES DAILY Patient not taking: Reported on 11/28/2022 01/29/22   Doreene Nest, NP  ibuprofen (ADVIL) 600 MG tablet TAKE 1 TABLET BY MOUTH EVERY 6 HOURS 02/13/23   Brock Bad, MD  NIFEdipine (ADALAT CC) 30 MG 24 hr tablet TAKE 1 TABLET (30 MG TOTAL) BY MOUTH DAILY. FOR BLOOD PRESSURE. 02/13/23   Doreene Nest, NP  norethindrone-ethinyl estradiol-FE (JUNEL FE 1/20) 1-20 MG-MCG tablet Take 1 tablet by mouth daily. Patient not taking: Reported on 07/27/2023 12/27/22   Doreene Nest, NP  pantoprazole (PROTONIX) 20 MG tablet TAKE 1 TABLET (20 MG TOTAL) BY MOUTH DAILY. FOR HEARTBURN. 02/13/23   Doreene Nest, NP  Semaglutide-Weight Management (WEGOVY) 0.25 MG/0.5ML SOAJ Inject 0.25 mg into the skin once a  week. Patient not taking: Reported on 12/27/2022 09/19/22   Doreene Nest, NP  Vitamin D, Ergocalciferol, (DRISDOL) 1.25 MG (50000 UNIT) CAPS capsule Take 1 capsule by mouth once weekly for 12 weeks. Patient not taking: Reported on 07/27/2023 12/28/22   Doreene Nest, NP  hydrochlorothiazide (HYDRODIURIL) 12.5 MG tablet TAKE 1 TABLET BY MOUTH EVERY DAY FOR BLOOD PRESSURE Patient not taking: Reported on 12/10/2020 07/16/20 01/03/21  Doreene Nest, NP    Family History Family History  Problem Relation Age of Onset   Diabetes Mother    Arthritis Mother    Hypertension Mother    Diabetes Maternal Grandmother    Hypertension Maternal Grandmother    Cancer Neg Hx    Heart disease Neg Hx     Social History Social History   Tobacco Use   Smoking status: Never   Smokeless tobacco: Never  Vaping Use   Vaping status: Never Used  Substance Use Topics   Alcohol use: No   Drug use: No     Allergies   Hydralazine and Lasix [furosemide]   Review of Systems Review of Systems  Respiratory:  Positive for cough.      Physical Exam Triage Vital Signs ED Triage Vitals  Encounter Vitals Group     BP 08/27/23 0812 (!) 158/101     Systolic BP Percentile --      Diastolic BP Percentile --      Pulse Rate 08/27/23 0812 87     Resp 08/27/23 0812 19     Temp 08/27/23 0812 97.6 F (36.4 C)     Temp Source 08/27/23 0812 Oral     SpO2 08/27/23 0812 98 %     Weight --      Height --      Head Circumference --      Peak Flow --      Pain Score 08/27/23 0816 6     Pain Loc --      Pain Education --      Exclude from Growth Chart --    No data found.  Updated Vital Signs BP (!) 158/101 (BP Location: Left Arm) Comment: Did not take bp meds this morning  Pulse 87   Temp 97.6 F (36.4 C) (Oral)   Resp 19   LMP 08/05/2023 (Approximate)   SpO2 98%   Visual Acuity Right Eye Distance:   Left Eye Distance:   Bilateral Distance:    Right Eye Near:   Left Eye Near:     Bilateral Near:     Physical Exam Vitals reviewed.  Constitutional:      Appearance: She is ill-appearing.  HENT:     Head: Normocephalic and atraumatic.  Right Ear: External ear normal.     Left Ear: External ear normal.     Nose: Mucosal edema, congestion and rhinorrhea present. Rhinorrhea is purulent.     Mouth/Throat:     Lips: Pink.     Mouth: Mucous membranes are moist.     Pharynx: Oropharynx is clear. Uvula midline.  Eyes:     Extraocular Movements: Extraocular movements intact.     Conjunctiva/sclera: Conjunctivae normal.     Pupils: Pupils are equal, round, and reactive to light.  Cardiovascular:     Rate and Rhythm: Normal rate and regular rhythm.  Pulmonary:     Effort: Pulmonary effort is normal.     Breath sounds: Normal breath sounds.  Musculoskeletal:        General: Normal range of motion.     Cervical back: Normal range of motion and neck supple.  Skin:    General: Skin is warm and dry.  Neurological:     General: No focal deficit present.     Mental Status: She is alert.      UC Treatments / Results  Labs (all labs ordered are listed, but only abnormal results are displayed) Labs Reviewed - No data to display  EKG   Radiology No results found.  Procedures Procedures (including critical care time)  Medications Ordered in UC Medications - No data to display  Initial Impression / Assessment and Plan / UC Course  I have reviewed the triage vital signs and the nursing notes.  Pertinent labs & imaging results that were available during my care of the patient were reviewed by me and considered in my medical decision making (see chart for details).    Acute non-recurrent pansinusitis  Amoxicillin 875 mg BID and Promethazine TID PRN Hydrate well with fluids.  Return if symptoms worsen or do not improve. Final Clinical Impressions(s) / UC Diagnoses   Final diagnoses:  Acute non-recurrent pansinusitis     Discharge Instructions       Return if symptoms worsen or do not improve.     ED Prescriptions     Medication Sig Dispense Auth. Provider   amoxicillin (AMOXIL) 875 MG tablet Take 1 tablet (875 mg total) by mouth 2 (two) times daily. 20 tablet Bing Neighbors, NP   promethazine-dextromethorphan (PROMETHAZINE-DM) 6.25-15 MG/5ML syrup Take 5 mLs by mouth 3 (three) times daily as needed for cough. 240 mL Bing Neighbors, NP      PDMP not reviewed this encounter.   Bing Neighbors, NP 08/27/23 [phone]

## 2023-08-27 NOTE — Discharge Instructions (Signed)
Return if symptoms worsen or do not improve.

## 2023-09-11 ENCOUNTER — Ambulatory Visit: Payer: Self-pay | Admitting: Surgery

## 2023-09-11 DIAGNOSIS — K432 Incisional hernia without obstruction or gangrene: Secondary | ICD-10-CM | POA: Diagnosis not present

## 2023-09-11 DIAGNOSIS — Z6841 Body Mass Index (BMI) 40.0 and over, adult: Secondary | ICD-10-CM | POA: Diagnosis not present

## 2023-10-01 ENCOUNTER — Telehealth: Payer: Self-pay | Admitting: Primary Care

## 2023-10-01 NOTE — Telephone Encounter (Signed)
 Copied from CRM [id number]. Topic: General - Other >> Oct 01, 2023 12:50 PM Corin V wrote: Reason for CRM: Patient calling to schedule her depo shot. Agent is not seeing this in the [facility] and is needing to verify that this can be done at clinic and does not require a prior authorization. Please call patient back to schedule  Called pt to schedule a office visit no answer left voice mail to call office

## 2023-10-01 NOTE — Telephone Encounter (Signed)
 Patient just needs to be scheduled for [place] Depo Shot.

## 2023-10-01 NOTE — Telephone Encounter (Signed)
 Pt called back and is schedule for her depo on 1/21

## 2023-10-11 ENCOUNTER — Encounter (HOSPITAL_COMMUNITY): Admission: RE | Admit: 2023-10-11 | Payer: Medicaid Other | Source: Ambulatory Visit

## 2023-10-16 ENCOUNTER — Ambulatory Visit (INDEPENDENT_AMBULATORY_CARE_PROVIDER_SITE_OTHER): Payer: Medicaid Other

## 2023-10-16 DIAGNOSIS — Z30011 Encounter for initial prescription of contraceptive pills: Secondary | ICD-10-CM

## 2023-10-16 MED ORDER — MEDROXYPROGESTERONE ACETATE 150 MG/ML IM SUSP
150.0000 mg | INTRAMUSCULAR | Status: AC
  Administered 2023-10-16: 150 mg via INTRAMUSCULAR

## 2023-10-16 NOTE — Progress Notes (Signed)
Per orders of Mayra Reel, DPN AGNP-C, injection of Medroxyprogesterone 150 mg given by Lewanda Rife in RUOQ ( pt request to get in RUOQ instead of thigh.). Patient tolerated injection well. Patient will make appointment for 3 month.

## 2023-10-18 ENCOUNTER — Ambulatory Visit (HOSPITAL_COMMUNITY): Admit: 2023-10-18 | Payer: Medicaid Other | Admitting: Surgery

## 2023-10-18 SURGERY — LAPAROSCOPIC VENTRAL HERNIA
Anesthesia: General

## 2023-10-25 ENCOUNTER — Encounter: Payer: Self-pay | Admitting: Cardiology

## 2023-10-25 ENCOUNTER — Ambulatory Visit: Payer: Medicaid Other | Attending: Cardiology | Admitting: Cardiology

## 2023-10-25 VITALS — BP 132/84 | HR 106 | Ht 59.0 in | Wt 280.8 lb

## 2023-10-25 DIAGNOSIS — I10 Essential (primary) hypertension: Secondary | ICD-10-CM

## 2023-10-25 DIAGNOSIS — R7303 Prediabetes: Secondary | ICD-10-CM | POA: Diagnosis not present

## 2023-10-25 DIAGNOSIS — Z6841 Body Mass Index (BMI) 40.0 and over, adult: Secondary | ICD-10-CM

## 2023-10-25 MED ORDER — AMLODIPINE BESYLATE 5 MG PO TABS: 5.0000 mg | ORAL_TABLET | Freq: Every day | ORAL | 3 refills | Status: DC

## 2023-10-25 NOTE — Patient Instructions (Addendum)
Medication Instructions:  Your physician has recommended you make the following change in your medication:  STOP: Nifedipine  START: Amlodipine 5 mg once daily *If you need a refill on your cardiac medications before your next appointment, please call your pharmacy*   Lab Work: HgbA1c If you have labs (blood work) drawn today and your tests are completely normal, you will receive your results only by: MyChart Message (if you have MyChart) OR A paper copy in the mail If you have any lab test that is abnormal or we need to change your treatment, we will call you to review the results.   Follow-Up: At [facility], you and your health needs are our priority.  As part of our continuing mission to provide you with exceptional heart care, we have created designated Provider Care Teams.  These Care Teams include your primary Cardiologist (physician) and Advanced Practice Providers (APPs -  Physician Assistants and Nurse Practitioners) who all work together to provide you with the care you need, when you need it.  Your next appointment:   16 week(s) okay to overbook  Provider:   Thomasene Ripple, DO     Other Instructions:

## 2023-10-25 NOTE — Progress Notes (Signed)
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 Discuss with patient their interest in participating in the free health coaching program and refer to REF 2201/Care Navigation.

## 2023-10-27 NOTE — Progress Notes (Signed)
Cardio-Obstetrics Clinic  Follow Up Note   Date:  10/27/2023   ID:  Suzanne [name], DOB 27-Apr-1986, MRN [id number]  PCP:  Doreene Nest, NP   [facility] Providers Cardiologist:  Thomasene Ripple, DO  Electrophysiologist:  None        Referring MD: Doreene Nest, NP   Chief Complaint: " I am doing fine"  History of Present Illness:    Suzanne [name] is a 38 y.o. female [G3P2012] who returns for follow up.  She presents with elevated blood pressure. She attributes the increase to stress related to her children's upcoming surgeries. She reports that her blood pressure has been normal at home. She is currently not working and is caring for her children full-time.  She has been experiencing heartburn, which is managed with protonix. She reports no other symptoms or concerns.  The patient is also considering a hernia surgery after her children's surgeries are completed. She reports no symptoms related to the hernia at this time.   Prior CV Studies Reviewed: The following studies were reviewed today:  TTE 02/2021 IMPRESSIONS   1. Left ventricular ejection fraction, by estimation, is 60 to 65%. The left ventricle has normal function. The left ventricle has no regional wall motion abnormalities. Left ventricular diastolic parameters were normal. The average left ventricular  global longitudinal strain is -23.7 %. The global longitudinal strain is normal.   2. Right ventricular systolic function is normal. The right ventricular size is normal. Tricuspid regurgitation signal is inadequate for assessing PA pressure.   3. The mitral valve is normal in structure. No evidence of mitral valve regurgitation. No evidence of mitral stenosis.   4. The aortic valve is normal in structure. Aortic valve regurgitation is not visualized. No aortic stenosis is present.   5. The inferior vena cava is normal in size with greater than 50% respiratory variability, suggesting right atrial pressure of  3 mmHg.   FINDINGS   Left Ventricle: Left ventricular ejection fraction, by estimation, is 60 to 65%. The left ventricle has normal function. The left ventricle has no regional wall motion abnormalities. The average left ventricular global longitudinal strain is -23.7 %.  The global longitudinal strain is normal. The left ventricular internal cavity size was normal in size. There is no left ventricular hypertrophy. Left ventricular diastolic parameters were normal. Normal left ventricular filling pressure.   Right Ventricle: The right ventricular size is normal. No increase in right ventricular wall thickness. Right ventricular systolic function is normal. Tricuspid regurgitation signal is inadequate for assessing PA pressure.   Left Atrium: Left atrial size was normal in size.   Right Atrium: Right atrial size was normal in size.   Pericardium: There is no evidence of pericardial effusion.   Mitral Valve: The mitral valve is normal in structure. No evidence of mitral valve regurgitation. No evidence of mitral valve stenosis.   Tricuspid Valve: The tricuspid valve is normal in structure. Tricuspid  valve regurgitation is not demonstrated. No evidence of tricuspid  stenosis.   Aortic Valve: The aortic valve is normal in structure. Aortic valve regurgitation is not visualized. No aortic stenosis is present.   Pulmonic Valve: The pulmonic valve was normal in structure. Pulmonic valve regurgitation is trivial. No evidence of pulmonic stenosis.   Aorta: The aortic root is normal in size and structure.   Venous: The inferior vena cava is normal in size with greater than 50% respiratory variability, suggesting right atrial pressure of 3 mmHg.   IAS/Shunts:  No atrial level shunt detected by color flow Doppler.   Past Medical History:  Diagnosis Date   Acid reflux    Blood transfusion without reported diagnosis    CHF (congestive heart failure) (HCC)    History of C-section 02/02/2021    For fetal intolerance of labor C/S x1 Desires VBAC   Hypertension    Morbid obesity (HCC) 02/12/2021   Postpartum care following cesarean delivery 08/10/2021   Sickle cell trait [facility])     Past Surgical History:  Procedure Laterality Date   CESAREAN SECTION N/A 10/08/2013   Procedure: CESAREAN SECTION;  Surgeon: Antionette Char, MD;  Location: [facility] ORS;  Service: Obstetrics;  Laterality: N/A;   CESAREAN SECTION N/A 07/10/2021   Procedure: CESAREAN SECTION;  Surgeon: [name], MD;  Location: [facility] LD ORS;  Service: Obstetrics;  Laterality: N/A;   TOOTH EXTRACTION N/A 12/01/2022   Procedure: EXTRACTION TEETH NUMBER ONE, SIXTEEN, SEVENTEEN, THIRTY TWO;  Surgeon: Ocie Doyne, DMD;  Location: [facility] OR;  Service: Oral Surgery;  Laterality: N/A;      OB History     Gravida  3   Para  2   Term  2   Preterm      AB  1   Living  2      SAB      IAB  1   Ectopic      Multiple  0   Live Births  2               Current Medications: Current Meds  Medication Sig   amLODipine (NORVASC) 5 MG tablet Take 1 tablet (5 mg total) by mouth daily.   fluticasone (FLONASE) 50 MCG/ACT nasal spray PLACE 1 SPRAY INTO BOTH NOSTRILS 2 (TWO) TIMES DAILY   ibuprofen (ADVIL) 600 MG tablet TAKE 1 TABLET BY MOUTH EVERY 6 HOURS   pantoprazole (PROTONIX) 20 MG tablet TAKE 1 TABLET (20 MG TOTAL) BY MOUTH DAILY. FOR HEARTBURN.   Semaglutide-Weight Management (WEGOVY) 0.25 MG/0.5ML SOAJ Inject 0.25 mg into the skin once a week.   [DISCONTINUED] NIFEdipine (ADALAT CC) 30 MG 24 hr tablet TAKE 1 TABLET (30 MG TOTAL) BY MOUTH DAILY. FOR BLOOD PRESSURE.   Current Facility-Administered Medications for the 10/25/23 encounter (Office Visit) with Thomasene Ripple, DO  Medication   medroxyPROGESTERone (DEPO-PROVERA) injection 150 mg   medroxyPROGESTERone (DEPO-PROVERA) injection 150 mg     Allergies:   Hydralazine and Lasix [furosemide]   Social History   Socioeconomic History   Marital status: Single     Spouse name: Not on file   Number of children: Not on file   Years of education: Not on file   Highest education level: Some college, no degree  Occupational History   Not on file  Tobacco Use   Smoking status: Never   Smokeless tobacco: Never  Vaping Use   Vaping status: Never Used  Substance and Sexual Activity   Alcohol use: No   Drug use: No   Sexual activity: Not Currently    Partners: Male    Birth control/protection: None  Other Topics Concern   Not on file  Social History Narrative   Completed some college   Works at [employer]   From [place]   Has one son, 7 years.   Enjoys playing with her son, sleeping.   Family is established here as well.   Social Drivers of Corporate investment banker Strain: Low Risk  (07/26/2023)   Overall Financial Resource Strain (CARDIA)  Difficulty of Paying Living Expenses: Not hard at all  Food Insecurity: No Food Insecurity (07/26/2023)   Hunger Vital Sign    Worried About Running Out of Food in the Last Year: Never true    Ran Out of Food in the Last Year: Never true  Transportation Needs: No Transportation Needs (07/26/2023)   PRAPARE - Administrator, Civil Service (Medical): No    Lack of Transportation (Non-Medical): No  Physical Activity: Unknown (07/26/2023)   Exercise Vital Sign    Days of Exercise per Week: 0 days    Minutes of Exercise per Session: Not on file  Stress: Patient Declined (07/26/2023)   [employer] of Occupational Health - Occupational Stress Questionnaire    Feeling of Stress : Patient declined  Social Connections: Unknown (07/26/2023)   Social Connection and Isolation Panel [NHANES]    Frequency of Communication with Friends and Family: More than three times a week    Frequency of Social Gatherings with Friends and Family: Twice a week    Attends Religious Services: More than 4 times per year    Active Member of [employer] or Organizations: No    Attends Hospital doctor: Patient declined    Marital Status: Patient declined      Family History  Problem Relation Age of Onset   Diabetes Mother    Arthritis Mother    Hypertension Mother    Diabetes Maternal Grandmother    Hypertension Maternal Grandmother    Cancer Neg Hx    Heart disease Neg Hx       ROS:   Please see the history of present illness.     All other systems reviewed and are negative.   Labs/EKG Reviewed:    EKG:   EKG is was not ordered today.   Recent Labs: 12/27/2022: ALT 11; BUN 10; Creatinine, Ser 0.60; Hemoglobin 10.8; Platelets 333.0; Potassium 4.1; Sodium 138   Recent Lipid Panel Lab Results  Component Value Date/Time   CHOL 197 03/29/2022 03:49 PM   TRIG 153.0 (H) 03/29/2022 03:49 PM   HDL 42.80 03/29/2022 03:49 PM   CHOLHDL 5 03/29/2022 03:49 PM   LDLCALC 124 (H) 03/29/2022 03:49 PM    Physical Exam:    VS:  BP 132/84   Pulse (!) 106   Ht 4\' 11"  (1.499 m)   Wt 280 lb 12.8 oz (127.4 kg)   SpO2 99%   BMI 56.71 kg/m     Wt Readings from Last 3 Encounters:  10/25/23 280 lb 12.8 oz (127.4 kg)  07/27/23 277 lb (125.6 kg)  12/27/22 260 lb (117.9 kg)     GEN:  Well nourished, well developed in no acute distress HEENT: Normal NECK: No JVD; No carotid bruits LYMPHATICS: No lymphadenopathy CARDIAC: RRR, no murmurs, rubs, gallops RESPIRATORY:  Clear to auscultation without rales, wheezing or rhonchi  ABDOMEN: Soft, non-tender, non-distended MUSCULOSKELETAL:  No edema; No deformity  SKIN: Warm and dry NEUROLOGIC:  Alert and oriented x 3 PSYCHIATRIC:  Normal affect    Risk Assessment/Risk Calculators:                 ASSESSMENT & PLAN:    Chronic hypertension Cardiovascular heart failure Morbid obesity Shortness of breath  Hypertension Elevated blood pressure today, possibly due to stress related to children's upcoming surgeries. Currently on Nifedipine and Propranolol. -Discontinue Nifedipine and start Amlodipine 5mg   daily.  Prediabetes Last checked in April, was prediabetic at that time. -Check current diabetes numbers  today.  Hyperlipidemia Last checked in April, was within normal limits at that time. -Schedule a morning appointment in 16 weeks for fasting lipid panel.  Asthma Currently managed with unspecified medication. -Continue current asthma medication.  Hernia Patient reports having a hernia, but no further details provided. Is planning surgery - date not set. From a CV standpoint she is cleared.  Follow-up in 16 weeks for fasting lipid panel and to assess response to Amlodipine.  She appears to be doing well from a cardiovascular standpoint.     Patient Instructions  Medication Instructions:  Your physician has recommended you make the following change in your medication:  STOP: Nifedipine  START: Amlodipine 5 mg once daily *If you need a refill on your cardiac medications before your next appointment, please call your pharmacy*   Lab Work: HgbA1c If you have labs (blood work) drawn today and your tests are completely normal, you will receive your results only by: MyChart Message (if you have MyChart) OR A paper copy in the mail If you have any lab test that is abnormal or we need to change your treatment, we will call you to review the results.   Follow-Up: At [facility], you and your health needs are our priority.  As part of our continuing mission to provide you with exceptional heart care, we have created designated Provider Care Teams.  These Care Teams include your primary Cardiologist (physician) and Advanced Practice Providers (APPs -  Physician Assistants and Nurse Practitioners) who all work together to provide you with the care you need, when you need it.  Your next appointment:   16 week(s) okay to overbook  Provider:   Thomasene Ripple, DO     Other Instructions:     Dispo:  No follow-ups on file.   Medication Adjustments/Labs and Tests  Ordered: Current medicines are reviewed at length with the patient today.  Concerns regarding medicines are outlined above.  Tests Ordered: Orders Placed This Encounter  Procedures   Hemoglobin A1c   EKG 12-Lead   Medication Changes: Meds ordered this encounter  Medications   amLODipine (NORVASC) 5 MG tablet    Sig: Take 1 tablet (5 mg total) by mouth daily.    Dispense:  90 tablet    Refill:  3

## 2023-11-06 DIAGNOSIS — R7303 Prediabetes: Secondary | ICD-10-CM | POA: Diagnosis not present

## 2023-11-06 LAB — HEMOGLOBIN A1C
Est. average glucose Bld gHb Est-mCnc: 128 mg/dL
Hgb A1c MFr Bld: 6.1 % — ABNORMAL HIGH (ref 4.8–5.6)

## 2023-11-09 ENCOUNTER — Encounter: Payer: Self-pay | Admitting: Cardiology

## 2023-11-21 ENCOUNTER — Other Ambulatory Visit: Payer: Self-pay | Admitting: Primary Care

## 2023-11-21 DIAGNOSIS — K219 Gastro-esophageal reflux disease without esophagitis: Secondary | ICD-10-CM

## 2023-11-26 ENCOUNTER — Ambulatory Visit: Payer: Medicaid Other | Admitting: Primary Care

## 2023-11-26 ENCOUNTER — Encounter: Payer: Self-pay | Admitting: Primary Care

## 2023-11-26 VITALS — BP 124/80 | HR 76 | Temp 99.5°F | Ht 59.0 in | Wt 284.0 lb

## 2023-11-26 DIAGNOSIS — K219 Gastro-esophageal reflux disease without esophagitis: Secondary | ICD-10-CM | POA: Diagnosis not present

## 2023-11-26 DIAGNOSIS — G8929 Other chronic pain: Secondary | ICD-10-CM

## 2023-11-26 DIAGNOSIS — M25562 Pain in left knee: Secondary | ICD-10-CM

## 2023-11-26 DIAGNOSIS — Z3042 Encounter for surveillance of injectable contraceptive: Secondary | ICD-10-CM

## 2023-11-26 DIAGNOSIS — Z0000 Encounter for general adult medical examination without abnormal findings: Secondary | ICD-10-CM | POA: Diagnosis not present

## 2023-11-26 DIAGNOSIS — M25561 Pain in right knee: Secondary | ICD-10-CM

## 2023-11-26 DIAGNOSIS — R7303 Prediabetes: Secondary | ICD-10-CM | POA: Diagnosis not present

## 2023-11-26 DIAGNOSIS — I5032 Chronic diastolic (congestive) heart failure: Secondary | ICD-10-CM

## 2023-11-26 DIAGNOSIS — D509 Iron deficiency anemia, unspecified: Secondary | ICD-10-CM

## 2023-11-26 DIAGNOSIS — E559 Vitamin D deficiency, unspecified: Secondary | ICD-10-CM

## 2023-11-26 DIAGNOSIS — I10 Essential (primary) hypertension: Secondary | ICD-10-CM

## 2023-11-26 DIAGNOSIS — Z6841 Body Mass Index (BMI) 40.0 and over, adult: Secondary | ICD-10-CM

## 2023-11-26 LAB — VITAMIN D 25 HYDROXY (VIT D DEFICIENCY, FRACTURES): VITD: 8.81 ng/mL — ABNORMAL LOW (ref 30.00–100.00)

## 2023-11-26 LAB — IBC + FERRITIN
Ferritin: 11.5 ng/mL (ref 10.0–291.0)
Iron: 33 ug/dL — ABNORMAL LOW (ref 42–145)
Saturation Ratios: 8.7 % — ABNORMAL LOW (ref 20.0–50.0)
TIBC: 378 ug/dL (ref 250.0–450.0)
Transferrin: 270 mg/dL (ref 212.0–360.0)

## 2023-11-26 LAB — LIPID PANEL
Cholesterol: 178 mg/dL (ref 0–200)
HDL: 44.3 mg/dL (ref >39.00)
LDL Cholesterol: 114 mg/dL — ABNORMAL HIGH (ref 0–99)
NonHDL: 134.15
Total CHOL/HDL Ratio: 4
Triglycerides: 101 mg/dL (ref 0.0–149.0)
VLDL: 20.2 mg/dL (ref 0.0–40.0)

## 2023-11-26 LAB — CBC
HCT: 35.8 % — ABNORMAL LOW (ref 36.0–46.0)
Hemoglobin: 10.9 g/dL — ABNORMAL LOW (ref 12.0–15.0)
MCHC: 30.4 g/dL (ref 30.0–36.0)
MCV: 67.5 fl — ABNORMAL LOW (ref 78.0–100.0)
Platelets: 341 10*3/uL (ref 150.0–400.0)
RBC: 5.3 Mil/uL — ABNORMAL HIGH (ref 3.87–5.11)
RDW: 18.5 % — ABNORMAL HIGH (ref 11.5–15.5)
WBC: 7.9 10*3/uL (ref 4.0–10.5)

## 2023-11-26 LAB — SEDIMENTATION RATE: Sed Rate: 63 mm/h — ABNORMAL HIGH (ref 0–20)

## 2023-11-26 MED ORDER — SEMAGLUTIDE-WEIGHT MANAGEMENT 0.25 MG/0.5ML ~~LOC~~ SOAJ: 0.2500 mg | SUBCUTANEOUS | 0 refills | Status: DC

## 2023-11-26 NOTE — Assessment & Plan Note (Signed)
Immunizations UTD. Declines influenza vaccine. Pap smear UTD  Discussed the importance of a healthy diet and regular exercise in order for weight loss, and to reduce the risk of further co-morbidity.  Exam stable. Labs pending.  Follow up in 1 year for repeat physical.  

## 2023-11-26 NOTE — Assessment & Plan Note (Signed)
 Reviewed A1c from February 2025. Will start [facility] for weight loss.

## 2023-11-26 NOTE — Assessment & Plan Note (Signed)
 Qualifies for GLP-1 agonist treatment given her medical history and BMI.  Start semaglutide [facility]) for weight loss. Start by injecting 0.25 mg into the skin once weekly for 4 weeks, then increase to 0.5 mg once weekly thereafter.  Follow-up in 3 months.

## 2023-11-26 NOTE — Assessment & Plan Note (Signed)
Continue Depo-Provera injections every 3 months 

## 2023-11-26 NOTE — Assessment & Plan Note (Signed)
 Repeat iron studies pending. Menorrhagia resolved with Depo-Provera injection.

## 2023-11-26 NOTE — Patient Instructions (Signed)
 Stop by the lab prior to leaving today. I will notify you of your results once received.   Start semaglutide [facility]) for weight loss. Start by injecting 0.25 mg into the skin once weekly for 4 weeks, then increase to 0.5 mg once weekly thereafter.  Please notify me once you have used your last 0.25 mg pen so that I can send the 0.5 mg dose to your pharmacy.  Please schedule a follow up visit for 3 months.  It was a pleasure to see you today!

## 2023-11-26 NOTE — Assessment & Plan Note (Signed)
Repeat vitamin D level pending. 

## 2023-11-26 NOTE — Assessment & Plan Note (Signed)
 Appears euvolemic today. Following with cardiology, office notes reviewed from January 2025.

## 2023-11-26 NOTE — Progress Notes (Signed)
 Subjective:    Patient ID: Suzanne [name], female    DOB: 01-28-1986, 38 y.o.   MRN: [id number]  HPI  Suzanne [name] is a very pleasant 38 y.o. female who presents today for complete physical and follow up of chronic conditions.  She would also like to try [facility] again. She has a history of CHF, hypertension, prediabetes. She was never able to pick up her Reginal Lutes last time due to lack of insurance coverage. She is working to cut back on sodas and pasta.   She continues to experience bilateral chronic knee pain.  Right knee pain chronic since fall in 2019.  Left knee pain since her last pregnancy.  She has pain when going up and down stairs.  She underwent plain films of the right knee in 2020 which revealed mild arthritis.  She has a family history of rheumatoid arthritis in her mother.  Immunizations: -Tetanus: Completed in 2022 -Influenza: Declines influenza vaccine.   Diet: Fair diet.  Exercise: No regular exercise.  Eye exam: Completed > 1 year ago  Dental exam: Completes semi-annually    Pap Smear: Completed in April 2022  BP Readings from Last 3 Encounters:  11/26/23 124/80  10/25/23 132/84  08/27/23 (!) 158/101       Review of Systems  Constitutional:  Negative for unexpected weight change.  HENT:  Negative for rhinorrhea.   Respiratory:  Negative for cough and shortness of breath.   Cardiovascular:  Negative for chest pain.  Gastrointestinal:  Negative for constipation and diarrhea.  Genitourinary:  Negative for difficulty urinating and menstrual problem.  Musculoskeletal:  Positive for arthralgias and joint swelling.  Skin:  Negative for rash.  Allergic/Immunologic: Negative for environmental allergies.  Neurological:  Negative for dizziness, numbness and headaches.  Psychiatric/Behavioral:  The patient is not nervous/anxious.          Past Medical History:  Diagnosis Date   Acid reflux    Acute diastolic CHF (congestive heart failure) (HCC) 10/18/2013    -happened within one week of delivery (2015), along with blood transfusion, pulmonary edema and infection at C/S site     Blood transfusion without reported diagnosis    CHF (congestive heart failure) (HCC)    Heart failure with preserved ejection fraction [facility]) 02/12/2021   02/11/2021: pt met with [facility], now is MD ONLY patient, added to Red Chart. Below is from cardiology note from 02/12/2021, please see note for additional details.     She currently is not on any antihypertensive medications.  But we are going to watch the patient closely and have low threshold to start her on antihypertensive medication to reduce cardiovascular complications during this pre   History of C-section 02/02/2021   For fetal intolerance of labor C/S x1 Desires VBAC   Hypertension    Morbid obesity (HCC) 02/12/2021   Postpartum care following cesarean delivery 08/10/2021   Ptyalism 04/11/2013   Sickle cell trait (HCC)     Social History   Socioeconomic History   Marital status: Single    Spouse name: Not on file   Number of children: Not on file   Years of education: Not on file   Highest education level: 12th grade  Occupational History   Not on file  Tobacco Use   Smoking status: Never   Smokeless tobacco: Never  Vaping Use   Vaping status: Never Used  Substance and Sexual Activity   Alcohol use: No   Drug use: No   Sexual activity:  Not Currently    Partners: Male    Birth control/protection: None  Other Topics Concern   Not on file  Social History Narrative   Completed some college   Works at [employer]   From [place]   Has one son, 7 years.   Enjoys playing with her son, sleeping.   Family is established here as well.   Social Drivers of Corporate investment banker Strain: Low Risk  (11/25/2023)   Overall Financial Resource Strain (CARDIA)    Difficulty of Paying Living Expenses: Not hard at all  Food Insecurity: No Food Insecurity (11/25/2023)   Hunger Vital Sign     Worried About Running Out of Food in the Last Year: Never true    Ran Out of Food in the Last Year: Never true  Transportation Needs: No Transportation Needs (11/25/2023)   PRAPARE - Administrator, Civil Service (Medical): No    Lack of Transportation (Non-Medical): No  Physical Activity: Unknown (11/25/2023)   Exercise Vital Sign    Days of Exercise per Week: 0 days    Minutes of Exercise per Session: Not on file  Stress: No Stress Concern Present (11/25/2023)   [employer] of Occupational Health - Occupational Stress Questionnaire    Feeling of Stress : Only a little  Social Connections: Moderately Integrated (11/25/2023)   Social Connection and Isolation Panel [NHANES]    Frequency of Communication with Friends and Family: More than three times a week    Frequency of Social Gatherings with Friends and Family: Once a week    Attends Religious Services: More than 4 times per year    Active Member of Clubs or Organizations: Yes    Attends Banker Meetings: More than 4 times per year    Marital Status: Never married  Intimate Partner Violence: Unknown (12/30/2021)   Received from [facility]   HITS    Physically Hurt: Not on file    Insult or Talk Down To: Not on file    Threaten Physical Harm: Not on file    Scream or Curse: Not on file    Past Surgical History:  Procedure Laterality Date   CESAREAN SECTION N/A 10/08/2013   Procedure: CESAREAN SECTION;  Surgeon: Antionette Char, MD;  Location: [facility] ORS;  Service: Obstetrics;  Laterality: N/A;   CESAREAN SECTION N/A 07/10/2021   Procedure: CESAREAN SECTION;  Surgeon: [name], MD;  Location: [facility] LD ORS;  Service: Obstetrics;  Laterality: N/A;   TOOTH EXTRACTION N/A 12/01/2022   Procedure: EXTRACTION TEETH NUMBER ONE, SIXTEEN, SEVENTEEN, THIRTY TWO;  Surgeon: Ocie Doyne, DMD;  Location: [facility] OR;  Service: Oral Surgery;  Laterality: N/A;    Family History  Problem Relation Age of Onset    Diabetes Mother    Arthritis Mother    Hypertension Mother    Diabetes Maternal Grandmother    Hypertension Maternal Grandmother    Cancer Neg Hx    Heart disease Neg Hx     Allergies  Allergen Reactions   Hydralazine Itching   Lasix [Furosemide] Swelling    Mouth and right side of face with oral furosemide. Pt can tolerate IV form     Current Outpatient Medications on File Prior to Visit  Medication Sig Dispense Refill   amLODipine (NORVASC) 5 MG tablet Take 1 tablet (5 mg total) by mouth daily. 90 tablet 3   fluticasone (FLONASE) 50 MCG/ACT nasal spray PLACE 1 SPRAY INTO BOTH NOSTRILS 2 (TWO)  TIMES DAILY 48 mL 0   ibuprofen (ADVIL) 600 MG tablet TAKE 1 TABLET BY MOUTH EVERY 6 HOURS 30 tablet 11   pantoprazole (PROTONIX) 20 MG tablet TAKE 1 TABLET (20 MG TOTAL) BY MOUTH DAILY. FOR HEARTBURN. 90 tablet 0   Current Facility-Administered Medications on File Prior to Visit  Medication Dose Route Frequency Provider Last Rate Last Admin   medroxyPROGESTERone (DEPO-PROVERA) injection 150 mg  150 mg Intramuscular Q90 days Doreene Nest, NP   150 mg at 07/27/23 0935   medroxyPROGESTERone (DEPO-PROVERA) injection 150 mg  150 mg Intramuscular Q90 days Doreene Nest, NP   150 mg at 10/16/23 1046    BP 124/80   Pulse 76   Temp 99.5 F (37.5 C) (Temporal)   Ht 4\' 11"  (1.499 m)   Wt 284 lb (128.8 kg)   LMP  (LMP Unknown)   SpO2 98%   BMI 57.36 kg/m  Objective:   Physical Exam HENT:     Right Ear: Tympanic membrane and ear canal normal.     Left Ear: Tympanic membrane and ear canal normal.  Eyes:     Pupils: Pupils are equal, round, and reactive to light.  Cardiovascular:     Rate and Rhythm: Normal rate and regular rhythm.  Pulmonary:     Effort: Pulmonary effort is normal.     Breath sounds: Normal breath sounds.  Abdominal:     General: Bowel sounds are normal.     Palpations: Abdomen is soft.     Tenderness: There is no abdominal tenderness.  Musculoskeletal:         General: Normal range of motion.     Cervical back: Neck supple.  Skin:    General: Skin is warm and dry.  Neurological:     Mental Status: She is alert and oriented to person, place, and time.     Cranial Nerves: No cranial nerve deficit.     Deep Tendon Reflexes:     Reflex Scores:      Patellar reflexes are 2+ on the right side and 2+ on the left side. Psychiatric:        Mood and Affect: Mood normal.           Assessment & Plan:  Preventative health care Assessment & Plan: Immunizations UTD. Declines influenza vaccine.  Pap smear UTD.  Discussed the importance of a healthy diet and regular exercise in order for weight loss, and to reduce the risk of further co-morbidity.  Exam stable. Labs pending.  Follow up in 1 year for repeat physical.    Chronic diastolic HF (heart failure) (HCC) Assessment & Plan: Appears euvolemic today. Following with cardiology, office notes reviewed from January 2025.  Orders: -     Semaglutide-Weight Management; Inject 0.25 mg into the skin once a week.  Dispense: 2 mL; Refill: 0  Essential hypertension Assessment & Plan: Controlled.  Continue amlodipine 5 mg daily.  Orders: -     Semaglutide-Weight Management; Inject 0.25 mg into the skin once a week.  Dispense: 2 mL; Refill: 0  Gastroesophageal reflux disease without esophagitis Assessment & Plan: Controlled. Continue pantoprazole 20 mg daily.   Iron deficiency anemia, unspecified iron deficiency anemia type Assessment & Plan: Repeat iron studies pending. Menorrhagia resolved with Depo-Provera injection.  Orders: -     IBC + Ferritin -     CBC  Prediabetes Assessment & Plan: Reviewed A1c from February 2025. Will start [facility] for weight loss.  Orders: -  Semaglutide-Weight Management; Inject 0.25 mg into the skin once a week.  Dispense: 2 mL; Refill: 0 -     Lipid panel  Morbid obesity with BMI of 50.0-59.9, adult [facility]) Assessment &  Plan: Qualifies for GLP-1 agonist treatment given her medical history and BMI.  Start semaglutide [facility]) for weight loss. Start by injecting 0.25 mg into the skin once weekly for 4 weeks, then increase to 0.5 mg once weekly thereafter.  Follow-up in 3 months.  Orders: -     Semaglutide-Weight Management; Inject 0.25 mg into the skin once a week.  Dispense: 2 mL; Refill: 0  Bilateral chronic knee pain Assessment & Plan: Likely combination of her mild arthritis with her weight. Will evaluate for rheumatoid arthritis with lab work today. Discussed sports medicine evaluation. Will work on weight loss.  Orders: -     Cyclic citrul peptide antibody, IgG -     Rheumatoid factor -     Sedimentation rate  Vitamin D deficiency Assessment & Plan: Repeat vitamin D level pending.  Orders: -     VITAMIN D 25 Hydroxy (Vit-D Deficiency, Fractures)  Encounter for surveillance of injectable contraceptive Assessment & Plan: Continue Depo-Provera injections every 3 months.         Doreene Nest, NP

## 2023-11-26 NOTE — Assessment & Plan Note (Signed)
Controlled.  Continue pantoprazole 20 mg daily.  

## 2023-11-26 NOTE — Assessment & Plan Note (Signed)
 Controlled. Continue amlodipine 5 mg daily.

## 2023-11-26 NOTE — Assessment & Plan Note (Signed)
 Likely combination of her mild arthritis with her weight. Will evaluate for rheumatoid arthritis with lab work today. Discussed sports medicine evaluation. Will work on weight loss.

## 2023-11-27 MED ORDER — VITAMIN D (ERGOCALCIFEROL) 1.25 MG (50000 UNIT) PO CAPS: ORAL_CAPSULE | ORAL | 0 refills | Status: DC

## 2023-11-28 ENCOUNTER — Other Ambulatory Visit (HOSPITAL_COMMUNITY): Payer: Self-pay

## 2023-11-28 ENCOUNTER — Telehealth: Payer: Self-pay | Admitting: Pharmacy Technician

## 2023-11-28 LAB — CYCLIC CITRUL PEPTIDE ANTIBODY, IGG: Cyclic Citrullin Peptide Ab: <16 U

## 2023-11-28 LAB — RHEUMATOID FACTOR: Rheumatoid fact SerPl-aCnc: <10 [IU]/mL (ref <14)

## 2023-11-28 NOTE — Telephone Encounter (Signed)
 Pharmacy Patient Advocate Encounter   Received notification from Onbase that prior authorization for [facility] 0.25MG /0.5ML PEN is required/requested.   Insurance verification completed.   The patient is insured through [facility] [place] .   Per test claim: PA required; PA submitted to above mentioned insurance via CoverMyMeds Key/confirmation #/EOC [facility] Status is pending

## 2023-11-29 ENCOUNTER — Other Ambulatory Visit (HOSPITAL_COMMUNITY): Payer: Self-pay

## 2023-11-29 NOTE — Telephone Encounter (Signed)
 Pharmacy Patient Advocate Encounter  Received notification from [facility] [place] Medicaid that Prior Authorization for [facility] 0.25MG /0.5ML PEN has been APPROVED from 11/28/2023 to 05/26/2024. Unable to obtain price due to refill too soon rejection, last fill date 11/28/2023 next available fill date03/25/2025.   PA #/Case ID/Reference #: [id number]

## 2023-12-17 DIAGNOSIS — D509 Iron deficiency anemia, unspecified: Secondary | ICD-10-CM

## 2023-12-17 DIAGNOSIS — E559 Vitamin D deficiency, unspecified: Secondary | ICD-10-CM

## 2023-12-17 DIAGNOSIS — I5032 Chronic diastolic (congestive) heart failure: Secondary | ICD-10-CM

## 2023-12-17 DIAGNOSIS — R7303 Prediabetes: Secondary | ICD-10-CM

## 2023-12-17 DIAGNOSIS — G8929 Other chronic pain: Secondary | ICD-10-CM

## 2023-12-17 DIAGNOSIS — K219 Gastro-esophageal reflux disease without esophagitis: Secondary | ICD-10-CM

## 2023-12-17 DIAGNOSIS — I10 Essential (primary) hypertension: Secondary | ICD-10-CM

## 2023-12-17 MED ORDER — SEMAGLUTIDE-WEIGHT MANAGEMENT 0.5 MG/0.5ML ~~LOC~~ SOAJ
0.5000 mg | SUBCUTANEOUS | 0 refills | Status: DC
Start: 2023-12-17 — End: 2024-01-18

## 2024-01-02 ENCOUNTER — Telehealth: Payer: Self-pay

## 2024-01-02 NOTE — Telephone Encounter (Signed)
 Patient can have next Depo Shot between the dates of April 8th- April 22nd. This can be [place] only or if patient has other concerns can be OV.

## 2024-01-02 NOTE — Telephone Encounter (Signed)
 Copied from CRM [id number]. Topic: Appointments - Scheduling Inquiry for Clinic >> Jan 02, 2024  9:06 AM Quay Burow wrote: Reason for CRM: pt calling back about depo shot f/u. April 16th appt was cancelled. Thanks

## 2024-01-02 NOTE — Telephone Encounter (Signed)
 Called  pt and schedule a appt for Depo Shot

## 2024-01-02 NOTE — Telephone Encounter (Signed)
 When is this patient due for a depo shot? Does the patient need a nurse visit only? The cancelled appointment was an office visit.   Please advise so  we can reschedule accurately.

## 2024-01-09 ENCOUNTER — Ambulatory Visit: Payer: Medicaid Other | Admitting: Primary Care

## 2024-01-10 ENCOUNTER — Ambulatory Visit (INDEPENDENT_AMBULATORY_CARE_PROVIDER_SITE_OTHER)

## 2024-01-10 DIAGNOSIS — Z3042 Encounter for surveillance of injectable contraceptive: Secondary | ICD-10-CM

## 2024-01-10 MED ORDER — MEDROXYPROGESTERONE ACETATE 150 MG/ML IM SUSP
150.0000 mg | INTRAMUSCULAR | Status: AC
Start: 2024-01-10 — End: ?
  Administered 2024-01-10: 150 mg via INTRAMUSCULAR

## 2024-01-10 NOTE — Progress Notes (Signed)
 Per orders of Aneta Bar, DPN AGNP-C, injection of medroxyprogesterone 150 mg IM given by Claretha Crocker in [facility] per pt request Patient tolerated injection well. Patient will make appointment for 3 month.

## 2024-01-17 ENCOUNTER — Other Ambulatory Visit: Payer: Self-pay | Admitting: Primary Care

## 2024-01-17 DIAGNOSIS — R7303 Prediabetes: Secondary | ICD-10-CM

## 2024-01-17 DIAGNOSIS — K219 Gastro-esophageal reflux disease without esophagitis: Secondary | ICD-10-CM

## 2024-01-17 DIAGNOSIS — I10 Essential (primary) hypertension: Secondary | ICD-10-CM

## 2024-01-17 DIAGNOSIS — E559 Vitamin D deficiency, unspecified: Secondary | ICD-10-CM

## 2024-01-17 DIAGNOSIS — D509 Iron deficiency anemia, unspecified: Secondary | ICD-10-CM

## 2024-01-17 DIAGNOSIS — G8929 Other chronic pain: Secondary | ICD-10-CM

## 2024-01-17 DIAGNOSIS — I5032 Chronic diastolic (congestive) heart failure: Secondary | ICD-10-CM

## 2024-01-18 MED ORDER — SEMAGLUTIDE-WEIGHT MANAGEMENT 1 MG/0.5ML ~~LOC~~ SOAJ: 1.0000 mg | SUBCUTANEOUS | 0 refills | Status: DC

## 2024-02-12 ENCOUNTER — Encounter: Payer: Self-pay | Admitting: Cardiology

## 2024-02-12 ENCOUNTER — Ambulatory Visit: Payer: Medicaid Other | Attending: Cardiology | Admitting: Cardiology

## 2024-02-12 ENCOUNTER — Other Ambulatory Visit: Payer: Self-pay | Admitting: Primary Care

## 2024-02-12 VITALS — BP 114/90 | HR 90 | Ht 59.0 in | Wt 272.2 lb

## 2024-02-12 DIAGNOSIS — I10 Essential (primary) hypertension: Secondary | ICD-10-CM

## 2024-02-12 DIAGNOSIS — I5032 Chronic diastolic (congestive) heart failure: Secondary | ICD-10-CM | POA: Diagnosis not present

## 2024-02-12 DIAGNOSIS — R7303 Prediabetes: Secondary | ICD-10-CM

## 2024-02-12 DIAGNOSIS — D509 Iron deficiency anemia, unspecified: Secondary | ICD-10-CM

## 2024-02-12 DIAGNOSIS — Z6841 Body Mass Index (BMI) 40.0 and over, adult: Secondary | ICD-10-CM | POA: Diagnosis not present

## 2024-02-12 DIAGNOSIS — M25562 Pain in left knee: Secondary | ICD-10-CM

## 2024-02-12 NOTE — Patient Instructions (Signed)

## 2024-02-12 NOTE — Progress Notes (Signed)
 Cardio-Obstetrics Clinic  Follow Up Note   Date:  02/13/2024   ID:  ALDEN FEAGAN, DOB [birth year], MRN [id number]  PCP:  Gabriel John, NP   [facility] Providers Cardiologist:  Derhonda Eastlick, DO  Electrophysiologist:  None        Referring MD: Gabriel John, NP   Chief Complaint: " I am doing fine"  History of Present Illness:    Suzanne [name] is a 38 y.o. female [G3P2012] who returns for follow up.  Medical history includes chronic hypertension, morbid obesity, hyperlipidemia prediabetes here today for follow-up visit.  Since her last visit she has been doing well.  She offers no complaints at this time.  She has lost some weight and she is very happy about this.  She is now working out of the house she is no longer doing work from home.  She works with [employer] facilities.  Prior CV Studies Reviewed: The following studies were reviewed today:  TTE 02/2021 IMPRESSIONS   1. Left ventricular ejection fraction, by estimation, is 60 to 65%. The left ventricle has normal function. The left ventricle has no regional wall motion abnormalities. Left ventricular diastolic parameters were normal. The average left ventricular  global longitudinal strain is -23.7 %. The global longitudinal strain is normal.   2. Right ventricular systolic function is normal. The right ventricular size is normal. Tricuspid regurgitation signal is inadequate for assessing PA pressure.   3. The mitral valve is normal in structure. No evidence of mitral valve regurgitation. No evidence of mitral stenosis.   4. The aortic valve is normal in structure. Aortic valve regurgitation is not visualized. No aortic stenosis is present.   5. The inferior vena cava is normal in size with greater than 50% respiratory variability, suggesting right atrial pressure of 3 mmHg.   FINDINGS   Left Ventricle: Left ventricular ejection fraction, by estimation, is 60 to 65%. The left ventricle has normal  function. The left ventricle has no regional wall motion abnormalities. The average left ventricular global longitudinal strain is -23.7 %.  The global longitudinal strain is normal. The left ventricular internal cavity size was normal in size. There is no left ventricular hypertrophy. Left ventricular diastolic parameters were normal. Normal left ventricular filling pressure.   Right Ventricle: The right ventricular size is normal. No increase in right ventricular wall thickness. Right ventricular systolic function is normal. Tricuspid regurgitation signal is inadequate for assessing PA pressure.   Left Atrium: Left atrial size was normal in size.   Right Atrium: Right atrial size was normal in size.   Pericardium: There is no evidence of pericardial effusion.   Mitral Valve: The mitral valve is normal in structure. No evidence of mitral valve regurgitation. No evidence of mitral valve stenosis.   Tricuspid Valve: The tricuspid valve is normal in structure. Tricuspid  valve regurgitation is not demonstrated. No evidence of tricuspid  stenosis.   Aortic Valve: The aortic valve is normal in structure. Aortic valve regurgitation is not visualized. No aortic stenosis is present.   Pulmonic Valve: The pulmonic valve was normal in structure. Pulmonic valve regurgitation is trivial. No evidence of pulmonic stenosis.   Aorta: The aortic root is normal in size and structure.   Venous: The inferior vena cava is normal in size with greater than 50% respiratory variability, suggesting right atrial pressure of 3 mmHg.   IAS/Shunts: No atrial level shunt detected by color flow Doppler.   Past Medical History:  Diagnosis  Date   Acid reflux    Acute diastolic CHF (congestive heart failure) (HCC) 10/18/2013   -happened within one week of delivery (2015), along with blood transfusion, pulmonary edema and infection at C/S site     Blood transfusion without reported diagnosis    CHF (congestive heart  failure) (HCC)    Heart failure with preserved ejection fraction [facility]) 02/12/2021   02/11/2021: pt met with [facility], now is MD ONLY patient, added to Red Chart. Below is from cardiology note from 02/12/2021, please see note for additional details.     She currently is not on any antihypertensive medications.  But we are going to watch the patient closely and have low threshold to start her on antihypertensive medication to reduce cardiovascular complications during this pre   History of C-section 02/02/2021   For fetal intolerance of labor C/S x1 Desires VBAC   Hypertension    Morbid obesity (HCC) 02/12/2021   Postpartum care following cesarean delivery 08/10/2021   Ptyalism 04/11/2013   Sickle cell trait (HCC)     Past Surgical History:  Procedure Laterality Date   CESAREAN SECTION N/A 10/08/2013   Procedure: CESAREAN SECTION;  Surgeon: Abdul Hodgkin, MD;  Location: [facility] ORS;  Service: Obstetrics;  Laterality: N/A;   CESAREAN SECTION N/A 07/10/2021   Procedure: CESAREAN SECTION;  Surgeon: Wendelyn Halter, MD;  Location: [facility] LD ORS;  Service: Obstetrics;  Laterality: N/A;   TOOTH EXTRACTION N/A 12/01/2022   Procedure: EXTRACTION TEETH NUMBER ONE, SIXTEEN, SEVENTEEN, THIRTY TWO;  Surgeon: Ascencion Lava, DMD;  Location: [facility] OR;  Service: Oral Surgery;  Laterality: N/A;      OB History     Gravida  3   Para  2   Term  2   Preterm      AB  1   Living  2      SAB      IAB  1   Ectopic      Multiple  0   Live Births  2               Current Medications: Current Meds  Medication Sig   amLODipine  (NORVASC ) 5 MG tablet Take 1 tablet (5 mg total) by mouth daily.   fluticasone  (FLONASE ) 50 MCG/ACT nasal spray PLACE 1 SPRAY INTO BOTH NOSTRILS 2 (TWO) TIMES DAILY   ibuprofen  (ADVIL ) 600 MG tablet TAKE 1 TABLET BY MOUTH EVERY 6 HOURS   pantoprazole  (PROTONIX ) 20 MG tablet TAKE 1 TABLET (20 MG TOTAL) BY MOUTH DAILY. FOR HEARTBURN.   Vitamin D , Ergocalciferol ,  (DRISDOL ) 1.25 MG (50000 UNIT) CAPS capsule Take 1 capsule by mouth once weekly for 12 weeks.   [DISCONTINUED] Semaglutide -Weight Management 1 MG/0.5ML SOAJ Inject 1 mg into the skin once a week.   Current Facility-Administered Medications for the 02/12/24 encounter (Office Visit) with Travell Desaulniers, DO  Medication   medroxyPROGESTERone  (DEPO-PROVERA ) injection 150 mg   medroxyPROGESTERone  (DEPO-PROVERA ) injection 150 mg   medroxyPROGESTERone  (DEPO-PROVERA ) injection 150 mg     Allergies:   Hydralazine  and Lasix  [furosemide ]   Social History   Socioeconomic History   Marital status: Single    Spouse name: Not on file   Number of children: Not on file   Years of education: Not on file   Highest education level: 12th grade  Occupational History   Not on file  Tobacco Use   Smoking status: Never   Smokeless tobacco: Never  Vaping Use   Vaping status: Never Used  Substance and Sexual Activity   Alcohol use: No   Drug use: No   Sexual activity: Not Currently    Partners: Male    Birth control/protection: None  Other Topics Concern   Not on file  Social History Narrative   Completed some college   Works at [employer]   From [place]   Has one son, 7 years.   Enjoys playing with her son, sleeping.   Family is established here as well.   Social Drivers of Corporate investment banker Strain: Low Risk  (11/25/2023)   Overall Financial Resource Strain (CARDIA)    Difficulty of Paying Living Expenses: Not hard at all  Food Insecurity: No Food Insecurity (11/25/2023)   Hunger Vital Sign    Worried About Running Out of Food in the Last Year: Never true    Ran Out of Food in the Last Year: Never true  Transportation Needs: No Transportation Needs (11/25/2023)   PRAPARE - Administrator, Civil Service (Medical): No    Lack of Transportation (Non-Medical): No  Physical Activity: Unknown (11/25/2023)   Exercise Vital Sign    Days of Exercise per Week: 0 days    Minutes of  Exercise per Session: Not on file  Stress: No Stress Concern Present (11/25/2023)   [employer] of Occupational Health - Occupational Stress Questionnaire    Feeling of Stress : Only a little  Social Connections: Moderately Integrated (11/25/2023)   Social Connection and Isolation Panel [NHANES]    Frequency of Communication with Friends and Family: More than three times a week    Frequency of Social Gatherings with Friends and Family: Once a week    Attends Religious Services: More than 4 times per year    Active Member of [employer] or Organizations: Yes    Attends Engineer, structural: More than 4 times per year    Marital Status: Never married      Family History  Problem Relation Age of Onset   Diabetes Mother    Arthritis Mother    Hypertension Mother    Diabetes Maternal Grandmother    Hypertension Maternal Grandmother    Cancer Neg Hx    Heart disease Neg Hx       ROS:   Please see the history of present illness.     All other systems reviewed and are negative.   Labs/EKG Reviewed:    EKG:   EKG is was not ordered today.   Recent Labs: 11/26/2023: Hemoglobin 10.9; Platelets 341.0   Recent Lipid Panel Lab Results  Component Value Date/Time   CHOL 178 11/26/2023 09:23 AM   TRIG 101.0 11/26/2023 09:23 AM   HDL 44.30 11/26/2023 09:23 AM   CHOLHDL 4 11/26/2023 09:23 AM   LDLCALC 114 (H) 11/26/2023 09:23 AM    Physical Exam:    VS:  BP (!) 114/90 (BP Location: Left Arm, Patient Position: Sitting, Cuff Size: Large)   Pulse 90   Ht 4\' 11"  (1.499 m)   Wt 272 lb 3.2 oz (123.5 kg)   SpO2 96%   BMI 54.98 kg/m     Wt Readings from Last 3 Encounters:  02/12/24 272 lb 3.2 oz (123.5 kg)  11/26/23 284 lb (128.8 kg)  10/25/23 280 lb 12.8 oz (127.4 kg)     GEN:  Well nourished, well developed in no acute distress HEENT: Normal NECK: No JVD; No carotid bruits LYMPHATICS: No lymphadenopathy CARDIAC: RRR, no murmurs, rubs, gallops RESPIRATORY:  Clear to  auscultation without rales, wheezing or rhonchi  ABDOMEN: Soft, non-tender, non-distended MUSCULOSKELETAL:  No edema; No deformity  SKIN: Warm and dry NEUROLOGIC:  Alert and oriented x 3 PSYCHIATRIC:  Normal affect    Risk Assessment/Risk Calculators:                 ASSESSMENT & PLAN:    Chronic hypertension Cardiovascular heart failure Morbid obesity Shortness of breath  Hypertension -her blood pressure in the office is 114/90 mmHg.  We talked about the elevation of her diastolic blood pressure.  And the fact that she may need to cut down on salt.  For now we will continue amlodipine  5 mg daily.  Prediabetes -still is prediabetic we will continue lifestyle modification for now if this does not improve it would be beneficial to start medicinal treatment.  But will defer to her primary  Hyperlipidemia-reviewed her lipid profile from March 2025.   Asthma Currently managed with unspecified medication. -Continue current asthma medication.    She appears to be doing well from a cardiovascular standpoint.     Patient Instructions  Medication Instructions:  Your physician recommends that you continue on your current medications as directed. Please refer to the Current Medication list given to you today.  *If you need a refill on your cardiac medications before your next appointment, please call your pharmacy*  Follow-Up: At [facility], you and your health needs are our priority.  As part of our continuing mission to provide you with exceptional heart care, our providers are all part of one team.  This team includes your primary Cardiologist (physician) and Advanced Practice Providers or APPs (Physician Assistants and Nurse Practitioners) who all work together to provide you with the care you need, when you need it.  Your next appointment:   1 year(s)  Provider:   Conswella Bruney, DO      Dispo:  No follow-ups on file.   Medication Adjustments/Labs and Tests  Ordered: Current medicines are reviewed at length with the patient today.  Concerns regarding medicines are outlined above.  Tests Ordered: No orders of the defined types were placed in this encounter.  Medication Changes: No orders of the defined types were placed in this encounter.

## 2024-02-13 ENCOUNTER — Other Ambulatory Visit: Payer: Self-pay | Admitting: Primary Care

## 2024-02-13 DIAGNOSIS — E559 Vitamin D deficiency, unspecified: Secondary | ICD-10-CM

## 2024-02-24 ENCOUNTER — Other Ambulatory Visit: Payer: Self-pay | Admitting: Primary Care

## 2024-02-24 DIAGNOSIS — K219 Gastro-esophageal reflux disease without esophagitis: Secondary | ICD-10-CM

## 2024-02-26 ENCOUNTER — Ambulatory Visit (INDEPENDENT_AMBULATORY_CARE_PROVIDER_SITE_OTHER): Admitting: Primary Care

## 2024-02-26 ENCOUNTER — Ambulatory Visit: Payer: Self-pay | Admitting: Primary Care

## 2024-02-26 ENCOUNTER — Encounter: Payer: Self-pay | Admitting: Primary Care

## 2024-02-26 VITALS — BP 124/62 | HR 97 | Temp 97.5°F | Ht 59.0 in | Wt 267.0 lb

## 2024-02-26 DIAGNOSIS — E559 Vitamin D deficiency, unspecified: Secondary | ICD-10-CM | POA: Diagnosis not present

## 2024-02-26 DIAGNOSIS — Z6841 Body Mass Index (BMI) 40.0 and over, adult: Secondary | ICD-10-CM

## 2024-02-26 DIAGNOSIS — R7303 Prediabetes: Secondary | ICD-10-CM | POA: Diagnosis not present

## 2024-02-26 LAB — HEMOGLOBIN A1C: Hgb A1c MFr Bld: 5.9 % (ref 4.6–6.5)

## 2024-02-26 LAB — VITAMIN D 25 HYDROXY (VIT D DEFICIENCY, FRACTURES): VITD: 19.02 ng/mL — ABNORMAL LOW (ref 30.00–100.00)

## 2024-02-26 NOTE — Progress Notes (Signed)
 Subjective:    Patient ID: Suzanne [name], female    DOB: June 15, 1986, 38 y.o.   MRN: [id number]  HPI  Suzanne [name] is a very pleasant 38 y.o. female with a history of CHF, hypertension, morbid obesity, prediabetes, chronic fatigue who presents today for follow-up of obesity.  She was last evaluated on 11/26/2023 for her annual physical.  During this visit we discussed GLP-1 agonist treatment given BMI and medical history.  She was initiated on Wegovy  0.25 mg weekly x 4 weeks then advised to increase to 0.5 mg weekly thereafter.  Since her last visit she has lost 17 pounds.  She is now up to Wegovy  1 mg weekly which was titrated up on 02/12/2024. She feels that Wegovy  has helped. She has not been able to exercise but has a plan. She has cut out sugary soda. She has increased intake of water . She's noticed a decreased appetite during the day, has noticed food cravings at night, is trying to combat.   She denies nausea, GERD, constipation, diarrhea.    Body mass index is 53.93 kg/m. Wt Readings from Last 3 Encounters:  02/26/24 267 lb (121.1 kg)  02/12/24 272 lb 3.2 oz (123.5 kg)  11/26/23 284 lb (128.8 kg)      Review of Systems  Respiratory:  Negative for shortness of breath.   Cardiovascular:  Negative for chest pain.  Gastrointestinal:  Negative for constipation and diarrhea.  Neurological:  Negative for dizziness.         Past Medical History:  Diagnosis Date   Acid reflux    Acute diastolic CHF (congestive heart failure) (HCC) 10/18/2013   -happened within one week of delivery (2015), along with blood transfusion, pulmonary edema and infection at C/S site     Blood transfusion without reported diagnosis    CHF (congestive heart failure) (HCC)    Heart failure with preserved ejection fraction [facility]) 02/12/2021   02/11/2021: pt met with [facility], now is MD ONLY patient, added to Red Chart. Below is from cardiology note from 02/12/2021, please see note for additional  details.     She currently is not on any antihypertensive medications.  But we are going to watch the patient closely and have low threshold to start her on antihypertensive medication to reduce cardiovascular complications during this pre   History of C-section 02/02/2021   For fetal intolerance of labor C/S x1 Desires VBAC   Hypertension    Morbid obesity (HCC) 02/12/2021   Postpartum care following cesarean delivery 08/10/2021   Ptyalism 04/11/2013   Sickle cell trait (HCC)     Social History   Socioeconomic History   Marital status: Single    Spouse name: Not on file   Number of children: Not on file   Years of education: Not on file   Highest education level: 12th grade  Occupational History   Not on file  Tobacco Use   Smoking status: Never   Smokeless tobacco: Never  Vaping Use   Vaping status: Never Used  Substance and Sexual Activity   Alcohol use: No   Drug use: No   Sexual activity: Not Currently    Partners: Male    Birth control/protection: None  Other Topics Concern   Not on file  Social History Narrative   Completed some college   Works at [employer]   From [employer]   Has one son, 7 years.   Enjoys playing with her son, sleeping.   Family is established  here as well.   Social Drivers of Corporate investment banker Strain: Low Risk  (11/25/2023)   Overall Financial Resource Strain (CARDIA)    Difficulty of Paying Living Expenses: Not hard at all  Food Insecurity: No Food Insecurity (11/25/2023)   Hunger Vital Sign    Worried About Running Out of Food in the Last Year: Never true    Ran Out of Food in the Last Year: Never true  Transportation Needs: No Transportation Needs (11/25/2023)   PRAPARE - Administrator, Civil Service (Medical): No    Lack of Transportation (Non-Medical): No  Physical Activity: Unknown (11/25/2023)   Exercise Vital Sign    Days of Exercise per Week: 0 days    Minutes of Exercise per Session: Not on file  Stress: No  Stress Concern Present (11/25/2023)   [employer] of Occupational Health - Occupational Stress Questionnaire    Feeling of Stress : Only a little  Social Connections: Moderately Integrated (11/25/2023)   Social Connection and Isolation Panel [NHANES]    Frequency of Communication with Friends and Family: More than three times a week    Frequency of Social Gatherings with Friends and Family: Once a week    Attends Religious Services: More than 4 times per year    Active Member of Clubs or Organizations: Yes    Attends Banker Meetings: More than 4 times per year    Marital Status: Never married  Intimate Partner Violence: Unknown (12/30/2021)   Received from [facility]   HITS    Physically Hurt: Not on file    Insult or Talk Down To: Not on file    Threaten Physical Harm: Not on file    Scream or Curse: Not on file    Past Surgical History:  Procedure Laterality Date   CESAREAN SECTION N/A 10/08/2013   Procedure: CESAREAN SECTION;  Surgeon: Abdul Hodgkin, MD;  Location: [facility] ORS;  Service: Obstetrics;  Laterality: N/A;   CESAREAN SECTION N/A 07/10/2021   Procedure: CESAREAN SECTION;  Surgeon: Wendelyn Halter, MD;  Location: [facility] LD ORS;  Service: Obstetrics;  Laterality: N/A;   TOOTH EXTRACTION N/A 12/01/2022   Procedure: EXTRACTION TEETH NUMBER ONE, SIXTEEN, SEVENTEEN, THIRTY TWO;  Surgeon: Ascencion Lava, DMD;  Location: [facility] OR;  Service: Oral Surgery;  Laterality: N/A;    Family History  Problem Relation Age of Onset   Diabetes Mother    Arthritis Mother    Hypertension Mother    Diabetes Maternal Grandmother    Hypertension Maternal Grandmother    Cancer Neg Hx    Heart disease Neg Hx     Allergies  Allergen Reactions   Hydralazine  Itching   Lasix  [Furosemide ] Swelling    Mouth and right side of face with oral furosemide . Pt can tolerate IV form     Current Outpatient Medications on File Prior to Visit  Medication Sig Dispense Refill    amLODipine  (NORVASC ) 5 MG tablet Take 1 tablet (5 mg total) by mouth daily. 90 tablet 3   fluticasone  (FLONASE ) 50 MCG/ACT nasal spray PLACE 1 SPRAY INTO BOTH NOSTRILS 2 (TWO) TIMES DAILY 48 mL 0   ibuprofen  (ADVIL ) 600 MG tablet TAKE 1 TABLET BY MOUTH EVERY 6 HOURS 30 tablet 11   pantoprazole  (PROTONIX ) 20 MG tablet TAKE 1 TABLET (20 MG TOTAL) BY MOUTH DAILY. FOR HEARTBURN. 90 tablet 2   Semaglutide -Weight Management (WEGOVY ) 1 MG/0.5ML SOAJ INJECT 1MG  INTO THE SKIN ONCE A WEEK  2 mL 0   Vitamin D , Ergocalciferol , (DRISDOL ) 1.25 MG (50000 UNIT) CAPS capsule TAKE 1 CAPSULE BY MOUTH ONCE WEEKLY FOR 12 WEEKS. 12 capsule 0   Current Facility-Administered Medications on File Prior to Visit  Medication Dose Route Frequency Provider Last Rate Last Admin   medroxyPROGESTERone  (DEPO-PROVERA ) injection 150 mg  150 mg Intramuscular Q90 days Pranshu Lyster K, NP   150 mg at 07/27/23 0935   medroxyPROGESTERone  (DEPO-PROVERA ) injection 150 mg  150 mg Intramuscular Q90 days Chia Mowers K, NP   150 mg at 10/16/23 1046   medroxyPROGESTERone  (DEPO-PROVERA ) injection 150 mg  150 mg Intramuscular Q90 days Tykee Heideman K, NP   150 mg at 01/10/24 0938    BP 124/62   Pulse 97   Temp (!) 97.5 F (36.4 C) (Temporal)   Ht 4\' 11"  (1.499 m)   Wt 267 lb (121.1 kg)   SpO2 100%   BMI 53.93 kg/m  Objective:   Physical Exam Cardiovascular:     Rate and Rhythm: Normal rate and regular rhythm.  Pulmonary:     Effort: Pulmonary effort is normal.     Breath sounds: Normal breath sounds.  Musculoskeletal:     Cervical back: Neck supple.  Skin:    General: Skin is warm and dry.  Neurological:     Mental Status: She is alert and oriented to person, place, and time.  Psychiatric:        Mood and Affect: Mood normal.           Assessment & Plan:  Vitamin D  deficiency Assessment & Plan: Repeat vitamin D  level pending Continue 50,000 IU capsules once weekly  Orders: -     VITAMIN D  25 Hydroxy  (Vit-D Deficiency, Fractures)  Prediabetes Assessment & Plan: Repeat A1c pending. Commended her on weight loss!  Orders: -     Hemoglobin A1c  Morbid obesity with BMI of 50.0-59.9, adult (HCC) Assessment & Plan: Improving with weight loss of 17 pounds since last visit! Commended her on this! Encouraged to increase physical activity.  Continue Wegovy  1 mg weekly for now. Consider dose increase to 1.7 mg. She will update. Follow up in 3 months.         Natalyah Cummiskey K Naol Ontiveros, NP

## 2024-02-26 NOTE — Assessment & Plan Note (Signed)
 Improving with weight loss of 17 pounds since last visit! Commended her on this! Encouraged to increase physical activity.  Continue Wegovy  1 mg weekly for now. Consider dose increase to 1.7 mg. She will update. Follow up in 3 months.

## 2024-02-26 NOTE — Assessment & Plan Note (Signed)
 Repeat A1c pending. Commended her on weight loss!

## 2024-02-26 NOTE — Assessment & Plan Note (Signed)
 Repeat vitamin D  level pending Continue 50,000 IU capsules once weekly

## 2024-02-26 NOTE — Patient Instructions (Signed)
 Continue Wegovy  1 mg weekly.  Stop by the lab prior to leaving today. I will notify you of your results once received.   Please schedule a follow up visit for 3 months.  It was a pleasure to see you today!

## 2024-03-03 ENCOUNTER — Other Ambulatory Visit: Payer: Self-pay | Admitting: Obstetrics

## 2024-03-13 ENCOUNTER — Other Ambulatory Visit: Payer: Self-pay | Admitting: Primary Care

## 2024-03-13 DIAGNOSIS — I10 Essential (primary) hypertension: Secondary | ICD-10-CM

## 2024-03-13 DIAGNOSIS — I5032 Chronic diastolic (congestive) heart failure: Secondary | ICD-10-CM

## 2024-03-13 DIAGNOSIS — R7303 Prediabetes: Secondary | ICD-10-CM

## 2024-03-13 DIAGNOSIS — G8929 Other chronic pain: Secondary | ICD-10-CM

## 2024-03-13 DIAGNOSIS — Z6841 Body Mass Index (BMI) 40.0 and over, adult: Secondary | ICD-10-CM

## 2024-03-13 DIAGNOSIS — D509 Iron deficiency anemia, unspecified: Secondary | ICD-10-CM

## 2024-03-27 ENCOUNTER — Ambulatory Visit

## 2024-03-27 DIAGNOSIS — Z30013 Encounter for initial prescription of injectable contraceptive: Secondary | ICD-10-CM | POA: Diagnosis not present

## 2024-03-27 MED ORDER — MEDROXYPROGESTERONE ACETATE 150 MG/ML IM SUSP
150.0000 mg | INTRAMUSCULAR | Status: AC
  Administered 2024-03-27: 150 mg via INTRAMUSCULAR

## 2024-03-27 NOTE — Progress Notes (Signed)
 Per orders of Mallie Gaskins, DPN AGNP-C, injection of medroxyprogesterone  150 mg IM given by Laray Arenas in [facility] (pts request).. Patient tolerated injection well. Patient will make appointment for 3 month.

## 2024-04-12 ENCOUNTER — Other Ambulatory Visit: Payer: Self-pay | Admitting: Primary Care

## 2024-04-12 DIAGNOSIS — I10 Essential (primary) hypertension: Secondary | ICD-10-CM

## 2024-04-12 DIAGNOSIS — R7303 Prediabetes: Secondary | ICD-10-CM

## 2024-04-12 DIAGNOSIS — D509 Iron deficiency anemia, unspecified: Secondary | ICD-10-CM

## 2024-04-12 DIAGNOSIS — I5032 Chronic diastolic (congestive) heart failure: Secondary | ICD-10-CM

## 2024-04-12 DIAGNOSIS — G8929 Other chronic pain: Secondary | ICD-10-CM

## 2024-05-09 ENCOUNTER — Other Ambulatory Visit: Payer: Self-pay | Admitting: Primary Care

## 2024-05-09 DIAGNOSIS — E559 Vitamin D deficiency, unspecified: Secondary | ICD-10-CM

## 2024-05-23 ENCOUNTER — Ambulatory Visit (HOSPITAL_COMMUNITY)
Admission: EM | Admit: 2024-05-23 | Discharge: 2024-05-23 | Disposition: A | Discharge status: Home / Self Care | Attending: Family Medicine | Admitting: Family Medicine

## 2024-05-23 ENCOUNTER — Other Ambulatory Visit: Payer: Self-pay

## 2024-05-23 ENCOUNTER — Encounter (HOSPITAL_COMMUNITY): Payer: Self-pay | Admitting: *Deleted

## 2024-05-23 DIAGNOSIS — J029 Acute pharyngitis, unspecified: Secondary | ICD-10-CM | POA: Insufficient documentation

## 2024-05-23 LAB — POCT RAPID STREP A (OFFICE): Rapid Strep A Screen: NEGATIVE

## 2024-05-23 LAB — POCT MONO SCREEN (KUC): Mono, POC: NEGATIVE

## 2024-05-23 NOTE — ED Triage Notes (Addendum)
 C/O nasal congestion and sore throat onset 3 wks ago - states sxs had improved, but then started to return 3 days ago. Denies fevers. Has been taking Dayquil Sinus, Alka Seltzer. Reports having negative home Covid test this AM. States it feels like strep.

## 2024-05-23 NOTE — Discharge Instructions (Signed)
 Your strep test is negative.  Culture of the throat will be sent, and staff will notify you if that is in turn positive.  The monotest was also negative.  You can continue to take the over-the-counter remedies for your symptoms as they have been helping.  Make sure you are drinking plenty of fluids

## 2024-05-23 NOTE — ED Provider Notes (Signed)
 [facility]    CSN: [id number] Arrival date & time: 05/23/24  0800      History   Chief Complaint Chief Complaint  Patient presents with   Sore Throat   Nasal Congestion    HPI Suzanne [name] is a 38 y.o. female.    Sore Throat  Here for sore throat and nasal congestion and headache.  She has not had any fever.  The symptoms started 3 days ago.  About 3 weeks ago she had similar symptoms with sore throat and nasal congestion and some sinus pressure.  She states the symptoms completely resolved before she started feeling bad again 3 days ago, on August 26.  She does have a little bit of cough.  She did a home COVID test this morning that was negative  No nausea vomiting or diarrhea  She has maybe felt a little short of breath, but she thinks that is due to the congestion.  Past medical history significant for congestive heart failure.  She is allergic to hydralazine  and Lasix .  She is not having periods due to being on the Depo-Provera  injection.  Past Medical History:  Diagnosis Date   Acid reflux    Acute diastolic CHF (congestive heart failure) (HCC) 10/18/2013   -happened within one week of delivery (2015), along with blood transfusion, pulmonary edema and infection at C/S site     Blood transfusion without reported diagnosis    CHF (congestive heart failure) (HCC)    Heart failure with preserved ejection fraction [facility]) 02/12/2021   02/11/2021: pt met with [facility] Cardiology, now is MD ONLY patient, added to Red Chart. Below is from cardiology note from 02/12/2021, please see note for additional details.     She currently is not on any antihypertensive medications.  But we are going to watch the patient closely and have low threshold to start her on antihypertensive medication to reduce cardiovascular complications during this pre   History of C-section 02/02/2021   For fetal intolerance of labor C/S x1 [facility] VBAC   Hypertension    Morbid obesity  (HCC) 02/12/2021   Postpartum care following cesarean delivery 08/10/2021   Ptyalism 04/11/2013   Sickle cell trait [facility])     Patient Active Problem List   Diagnosis Date Noted   Vitamin D  deficiency 11/26/2023   Umbilical hernia 07/27/2023   Iron deficiency anemia 12/27/2022   Prediabetes 12/27/2022   Chronic fatigue 12/27/2022   Preventative health care 03/29/2022   Chronic diastolic HF (heart failure) (HCC) 09/22/2021   Contraception management 09/16/2021   Alpha thalassemia silent carrier 02/23/2021   Sickle cell trait (HCC)    Acid reflux    Bilateral chronic knee pain 10/23/2018   Morbid obesity with BMI of 50.0-59.9, adult (HCC) 01/14/2016   Essential hypertension 12/17/2014    Past Surgical History:  Procedure Laterality Date   CESAREAN SECTION N/A 10/08/2013   Procedure: CESAREAN SECTION;  Surgeon: Olam Mill, MD;  Location: [facility];  Service: Obstetrics;  Laterality: N/A;   CESAREAN SECTION N/A 07/10/2021   Procedure: CESAREAN SECTION;  Surgeon: Jayne Vonn DEL, MD;  Location: [facility] LD [facility]S;  Service: Obstetrics;  Laterality: N/A;   TOOTH EXTRACTION N/A 12/01/2022   Procedure: EXTRACTION TEETH NUMBER ONE, SIXTEEN, SEVENTEEN, THIRTY TWO;  Surgeon: Sheryle Hamilton, DMD;  Location: [facility] OR;  Service: Oral Surgery;  Laterality: N/A;    OB History     Gravida  3   Para  2   Term  2  Preterm      AB  1   Living  2      SAB      IAB  1   Ectopic      Multiple  0   Live Births  2            Home Medications    Prior to Admission medications   Medication Sig Start Date End Date Taking? Authorizing Provider  amLODipine  (NORVASC ) 5 MG tablet Take 1 tablet (5 mg total) by mouth daily. 10/25/23  Yes Tobb, Kardie, DO  fluticasone  (FLONASE ) 50 MCG/ACT nasal spray PLACE 1 SPRAY INTO BOTH NOSTRILS 2 (TWO) TIMES DAILY 01/29/22  Yes Clark, Katherine K, NP  pantoprazole  (PROTONIX ) 20 MG tablet TAKE 1 TABLET (20 MG TOTAL) BY MOUTH DAILY. FOR HEARTBURN. 02/24/24   Yes Gretta Comer POUR, NP  Semaglutide -Weight Management (WEGOVY ) 1.7 MG/0.75ML SOAJ Inject 1.7 mg into the skin once a week. 04/16/24  Yes Gretta Comer POUR, NP  Vitamin D , Ergocalciferol , (DRISDOL ) 1.25 MG (50000 UNIT) CAPS capsule TAKE 1 CAPSULE BY MOUTH ONCE WEEKLY FOR 12 WEEKS. 05/09/24  Yes Gretta Comer POUR, NP  ibuprofen  (ADVIL ) 600 MG tablet TAKE 1 TABLET BY MOUTH EVERY 6 HOURS 03/04/24   Rudy Carlin LABOR, MD    Family History Family History  Problem Relation Age of Onset   Diabetes Mother    Arthritis Mother    Hypertension Mother    Diabetes Maternal Grandmother    Hypertension Maternal Grandmother    Cancer Neg Hx    Heart disease Neg Hx     Social History Social History   Tobacco Use   Smoking status: Never   Smokeless tobacco: Never  Vaping Use   Vaping status: Never Used  Substance Use Topics   Alcohol use: No   Drug use: No     Allergies   Hydralazine  and Lasix  [furosemide ]   Review of Systems Review of Systems   Physical Exam Triage Vital Signs ED Triage Vitals  Encounter Vitals Group     BP 05/23/24 0817 115/82     Girls Systolic BP Percentile --      Girls Diastolic BP Percentile --      Boys Systolic BP Percentile --      Boys Diastolic BP Percentile --      Pulse Rate 05/23/24 0817 86     Resp 05/23/24 0817 20     Temp 05/23/24 0817 98.4 F (36.9 C)     Temp Source 05/23/24 0817 Oral     SpO2 05/23/24 0817 97 %     Weight --      Height --      Head Circumference --      Peak Flow --      Pain Score 05/23/24 0818 6     Pain Loc --      Pain Education --      Exclude from Growth Chart --    No data found.  Updated Vital Signs BP 115/82   Pulse 86   Temp 98.4 F (36.9 C) (Oral)   Resp 20   SpO2 97%   Visual Acuity Right Eye Distance:   Left Eye Distance:   Bilateral Distance:    Right Eye Near:   Left Eye Near:    Bilateral Near:     Physical Exam Vitals reviewed.  Constitutional:      General: She is not in  acute distress.    Appearance: She is well-developed.  She is not ill-appearing, toxic-appearing or diaphoretic.  HENT:     Right Ear: Tympanic membrane and ear canal normal.     Left Ear: Tympanic membrane and ear canal normal.     Nose: Nose normal.     Mouth/Throat:     Mouth: Mucous membranes are moist. Mucous membranes are pale.     Comments: Her tonsils are 4+ hypertrophied with some clear exudate in the crypts.  They are mildly erythematous.  No swelling of the soft palate and no asymmetry. Eyes:     Extraocular Movements: Extraocular movements intact.     Conjunctiva/sclera: Conjunctivae normal.     Pupils: Pupils are equal, round, and reactive to light.  Cardiovascular:     Rate and Rhythm: Normal rate and regular rhythm.     Heart sounds: No murmur heard. Pulmonary:     Effort: Pulmonary effort is normal. No respiratory distress.     Breath sounds: No wheezing, rhonchi or rales.  Chest:     Chest wall: No tenderness.  Musculoskeletal:     Cervical back: Neck supple.  Lymphadenopathy:     Cervical: No cervical adenopathy.  Skin:    Capillary Refill: Capillary refill takes less than 2 seconds.     Coloration: Skin is not jaundiced or pale.  Neurological:     General: No focal deficit present.     Mental Status: She is alert and oriented to person, place, and time.  Psychiatric:        Behavior: Behavior normal.      UC Treatments / Results  Labs (all labs ordered are listed, but only abnormal results are displayed) Labs Reviewed  CULTURE, GROUP A STREP [facility])  POCT RAPID STREP A (OFFICE)  POCT MONO SCREEN ([facility])    EKG   Radiology No results found.  Procedures Procedures (including critical care time)  Medications Ordered in UC Medications - No data to display  Initial Impression / Assessment and Plan / UC Course  I have reviewed the triage vital signs and the nursing notes.  Pertinent labs & imaging results that were available during my care of the  patient were reviewed by me and considered in my medical decision making (see chart for details).     Mono test neg  Rapid strep is negative.  Throat culture is sent and we will notify and treat protocol if that is positive   Final Clinical Impressions(s) / UC Diagnoses   Final diagnoses:  Acute pharyngitis, unspecified etiology     Discharge Instructions      Your strep test is negative.  Culture of the throat will be sent, and staff will notify you if that is in turn positive.  The monotest was also negative.  You can continue to take the over-the-counter remedies for your symptoms as they have been helping.  Make sure you are drinking plenty of fluids       ED Prescriptions   None    PDMP not reviewed this encounter.   Vonna Sharlet POUR, MD 05/23/24 [phone]

## 2024-05-25 LAB — CULTURE, GROUP A STREP (THRC)

## 2024-05-27 ENCOUNTER — Ambulatory Visit (HOSPITAL_COMMUNITY): Payer: Self-pay

## 2024-05-28 ENCOUNTER — Encounter: Payer: Self-pay | Admitting: Primary Care

## 2024-05-28 ENCOUNTER — Ambulatory Visit (INDEPENDENT_AMBULATORY_CARE_PROVIDER_SITE_OTHER): Admitting: Primary Care

## 2024-05-28 VITALS — BP 130/68 | HR 88 | Temp 97.3°F | Ht 59.0 in | Wt 260.0 lb

## 2024-05-28 DIAGNOSIS — Z6841 Body Mass Index (BMI) 40.0 and over, adult: Secondary | ICD-10-CM | POA: Diagnosis not present

## 2024-05-28 DIAGNOSIS — B951 Streptococcus, group B, as the cause of diseases classified elsewhere: Secondary | ICD-10-CM | POA: Insufficient documentation

## 2024-05-28 DIAGNOSIS — J020 Streptococcal pharyngitis: Secondary | ICD-10-CM | POA: Diagnosis not present

## 2024-05-28 MED ORDER — SEMAGLUTIDE-WEIGHT MANAGEMENT 2.4 MG/0.75ML ~~LOC~~ SOAJ: 2.4000 mg | SUBCUTANEOUS | 0 refills | Status: DC

## 2024-05-28 MED ORDER — AMOXICILLIN 500 MG PO CAPS: 500.0000 mg | ORAL_CAPSULE | Freq: Three times a day (TID) | ORAL | 0 refills | Status: AC

## 2024-05-28 NOTE — Patient Instructions (Signed)
 Start amoxicillin  5 mg 3 times daily x 10 days.  Increase the dose of your Wegovy  to 2.4 mg weekly.  Continue to work on [employer].  Please schedule a follow up visit for 3 months.  It was a pleasure to see you today!

## 2024-05-28 NOTE — Progress Notes (Signed)
 Subjective:    Patient ID: Suzanne [name], female    DOB: Nov 19, 1985, 38 y.o.   MRN: [id number]  Argentina L Bennett is a very pleasant 38 y.o. female with a history of hypertension, obesity, CHF, anemia, prediabetes, who presents today for follow up of obesity and urgent care follow up.  1) Obesity: Currently managed on Wegovy  1.7 mg weekly for obesity.  She began Wegovy  in May 2025 and has been on the 1.7 mg dose since July 2025.  Since her last visit she has lost 7 pounds. She believes she's at a standstill with her weight. Has been snacking on cakes more frequently. Her cravings for sweets have increased. She's finding it harder to fight her cravings which are worse at night. She denies GERD, diarrhea, constipation. She has noticed increase nausea on the 1.7 mg dose which is not bothersome. She has noticed that her face breaks out more since she's been on Wegovy . She denies facial swelling, throat tightness, shortness of breath. She will treat with Coco butter with resolve temporarily. She has no history of acne. She denies changes in food or medications, soaps, detergents.   Diet currently consists of:  Breakfast: [place] sausage, chicken Lunch: Salad, fast food several days weekly  Dinner: Protein, veggies, left overs from fast food.  Snacks: Snack cakes Desserts: Daily  Beverages: Little liquid intake. Some water , Sugar Free Red Bull  Exercise: None  Body mass index is 52.51 kg/m.   Wt Readings from Last 3 Encounters:  05/28/24 260 lb (117.9 kg)  02/26/24 267 lb (121.1 kg)  02/12/24 272 lb 3.2 oz (123.5 kg)    2) Strep Pharyngitis: Evaluated at urgent care on 05/23/2024 for 3-day history of sore throat, nasal congestion, headache.  Symptoms originally began 3 weeks prior, resolved shortly after.  During her urgent care visit she tested negative for strep pharyngitis and mono.  She was encouraged to treat conservatively.  Throat culture was obtained which came back for  beta-hemolytic streptococci, not group A.  She was notified by urgent care on 05/27/2024 and has yet to be treated.  Today she continues to notice sore throat, scratchy throat. She denies fevers, chills. Her nasal congestion as improved. She has not been treated. She's been taking Dayquil, Alka Seltzer oTC with temporary improvement. Her symptoms are worse at night.     Review of Systems  Constitutional:  Negative for fever.  HENT:  Positive for sore throat. Negative for ear pain and facial swelling.   Respiratory:  Negative for shortness of breath.   Gastrointestinal:  Positive for nausea. Negative for constipation and diarrhea.         Past Medical History:  Diagnosis Date   Acid reflux    Acute diastolic CHF (congestive heart failure) (HCC) 10/18/2013   -happened within one week of delivery (2015), along with blood transfusion, pulmonary edema and infection at C/S site     Blood transfusion without reported diagnosis    CHF (congestive heart failure) (HCC)    Heart failure with preserved ejection fraction [facility]) 02/12/2021   02/11/2021: pt met with [facility] Cardiology, now is MD ONLY patient, added to Red Chart. Below is from cardiology note from 02/12/2021, please see note for additional details.     She currently is not on any antihypertensive medications.  But we are going to watch the patient closely and have low threshold to start her on antihypertensive medication to reduce cardiovascular complications during this pre   History of C-section 02/02/2021  For fetal intolerance of labor C/S x1 [facility] VBAC   Hypertension    Morbid obesity (HCC) 02/12/2021   Postpartum care following cesarean delivery 08/10/2021   Ptyalism 04/11/2013   Sickle cell trait (HCC)     Social History   Socioeconomic History   Marital status: Single    Spouse name: Not on file   Number of children: Not on file   Years of education: Not on file   Highest education level: Some college, no degree   Occupational History   Not on file  Tobacco Use   Smoking status: Never   Smokeless tobacco: Never  Vaping Use   Vaping status: Never Used  Substance and Sexual Activity   Alcohol use: No   Drug use: No   Sexual activity: Not Currently    Partners: Male    Birth control/protection: Injection  Other Topics Concern   Not on file  Social History Narrative   Completed some college   Works at [employer]   From [place]   Has one son, 7 years.   Enjoys playing with her son, sleeping.   Family is established here as well.   Social Drivers of Corporate investment banker Strain: Low Risk  (05/26/2024)   Overall Financial Resource Strain (CARDIA)    Difficulty of Paying Living Expenses: Not hard at all  Food Insecurity: No Food Insecurity (05/26/2024)   Hunger Vital Sign    Worried About Running Out of Food in the Last Year: Never true    Ran Out of Food in the Last Year: Never true  Transportation Needs: No Transportation Needs (05/26/2024)   PRAPARE - Administrator, Civil Service (Medical): No    Lack of Transportation (Non-Medical): No  Physical Activity: Insufficiently Active (05/26/2024)   Exercise Vital Sign    Days of Exercise per Week: 2 days    Minutes of Exercise per Session: 10 min  Stress: No Stress Concern Present (05/26/2024)   [employer] of Occupational Health - Occupational Stress Questionnaire    Feeling of Stress: Not at all  Social Connections: Moderately Integrated (05/26/2024)   Social Connection and Isolation Panel    Frequency of Communication with Friends and Family: More than three times a week    Frequency of Social Gatherings with Friends and Family: More than three times a week    Attends Religious Services: More than 4 times per year    Active Member of [employer] or Organizations: Yes    Attends Banker Meetings: Never    Marital Status: Never married  Intimate Partner Violence: Unknown (12/30/2021)   Received from [facility]    HITS    Physically Hurt: Not on file    Insult or Talk Down To: Not on file    Threaten Physical Harm: Not on file    Scream or Curse: Not on file    Past Surgical History:  Procedure Laterality Date   CESAREAN SECTION N/A 10/08/2013   Procedure: CESAREAN SECTION;  Surgeon: Olam Mill, MD;  Location: [facility];  Service: Obstetrics;  Laterality: N/A;   CESAREAN SECTION N/A 07/10/2021   Procedure: CESAREAN SECTION;  Surgeon: Jayne Vonn DEL, MD;  Location: [facility];  Service: Obstetrics;  Laterality: N/A;   TOOTH EXTRACTION N/A 12/01/2022   Procedure: EXTRACTION TEETH NUMBER ONE, SIXTEEN, SEVENTEEN, THIRTY TWO;  Surgeon: Sheryle Hamilton, DMD;  Location: [facility];  Service: Oral Surgery;  Laterality: N/A;    Family History  Problem Relation  Age of Onset   Diabetes Mother    Arthritis Mother    Hypertension Mother    Diabetes Maternal Grandmother    Hypertension Maternal Grandmother    Cancer Neg Hx    Heart disease Neg Hx     Allergies  Allergen Reactions   Hydralazine  Itching   Lasix  [Furosemide ] Swelling    Mouth and right side of face with oral furosemide . Pt can tolerate IV form     Current Outpatient Medications on File Prior to Visit  Medication Sig Dispense Refill   amLODipine  (NORVASC ) 5 MG tablet Take 1 tablet (5 mg total) by mouth daily. 90 tablet 3   fluticasone  (FLONASE ) 50 MCG/ACT nasal spray PLACE 1 SPRAY INTO BOTH NOSTRILS 2 (TWO) TIMES DAILY 48 mL 0   ibuprofen  (ADVIL ) 600 MG tablet TAKE 1 TABLET BY MOUTH EVERY 6 HOURS 30 tablet 5   pantoprazole  (PROTONIX ) 20 MG tablet TAKE 1 TABLET (20 MG TOTAL) BY MOUTH DAILY. FOR HEARTBURN. 90 tablet 2   Vitamin D , Ergocalciferol , (DRISDOL ) 1.25 MG (50000 UNIT) CAPS capsule TAKE 1 CAPSULE BY MOUTH ONCE WEEKLY FOR 12 WEEKS. 12 capsule 0   Current Facility-Administered Medications on File Prior to Visit  Medication Dose Route Frequency Provider Last Rate Last Admin   medroxyPROGESTERone  (DEPO-PROVERA ) injection 150 mg  150  mg Intramuscular Q90 days Zunaira Lamy K, NP   150 mg at 07/27/23 0935   medroxyPROGESTERone  (DEPO-PROVERA ) injection 150 mg  150 mg Intramuscular Q90 days Tymar Polyak K, NP   150 mg at 10/16/23 1046   medroxyPROGESTERone  (DEPO-PROVERA ) injection 150 mg  150 mg Intramuscular Q90 days Holman Bonsignore K, NP   150 mg at 01/10/24 9061   medroxyPROGESTERone  (DEPO-PROVERA ) injection 150 mg  150 mg Intramuscular Q90 days Gaspard Isbell K, NP   150 mg at 03/27/24 1150    BP 130/68   Pulse 88   Temp (!) 97.3 F (36.3 C) (Temporal)   Ht 4' 11 (1.499 m)   Wt 260 lb (117.9 kg)   SpO2 99%   BMI 52.51 kg/m  Objective:   Physical Exam HENT:     Mouth/Throat:     Pharynx: Pharyngeal swelling and posterior oropharyngeal erythema present.  Cardiovascular:     Rate and Rhythm: Normal rate and regular rhythm.  Pulmonary:     Effort: Pulmonary effort is normal.     Breath sounds: Normal breath sounds.  Musculoskeletal:     Cervical back: Neck supple.  Skin:    General: Skin is warm and dry.  Neurological:     Mental Status: She is alert and oriented to person, place, and time.  Psychiatric:        Mood and Affect: Mood normal.     Physical Exam        Assessment & Plan:  GBBS (group B beta hemolytic Streptococcus) pharyngitis Assessment & Plan: With positive culture per urgent care. Reviewed urgent care notes and labs.  Treat with Amoxil  500 mg 3 times daily x 10 days given continued symptoms. Follow-up as needed.  Orders: -     Amoxicillin ; Take 1 capsule (500 mg total) by mouth 3 (three) times daily for 10 days.  Dispense: 30 capsule; Refill: 0  Morbid obesity with BMI of 50.0-59.9, adult [facility]) Assessment & Plan: Improved but room for improvement.  We discussed the importance of eliminating snack cakes and fast food. Increase protein.  We also discussed meal prepping.  Increase Wegovy  to 2.4 mg weekly. We discussed that if  her facial acne becomes problematic  we can discontinue the Wegovy .  She would like to proceed.  At this point, no evidence of allergic reaction.   Orders: -     Semaglutide -Weight Management; Inject 2.4 mg into the skin once a week.  Dispense: 9 mL; Refill: 0    Assessment and Plan Assessment & Plan         Comer MARLA Gaskins, NP      History of Present Illness

## 2024-05-28 NOTE — Assessment & Plan Note (Signed)
 With positive culture per urgent care. Reviewed urgent care notes and labs.  Treat with Amoxil  500 mg 3 times daily x 10 days given continued symptoms. Follow-up as needed.

## 2024-05-28 NOTE — Assessment & Plan Note (Signed)
 Improved but room for improvement.  We discussed the importance of eliminating snack cakes and fast food. Increase protein.  We also discussed meal prepping.  Increase Wegovy  to 2.4 mg weekly. We discussed that if her facial acne becomes problematic we can discontinue the Wegovy .  She would like to proceed.  At this point, no evidence of allergic reaction.

## 2024-06-08 ENCOUNTER — Other Ambulatory Visit: Payer: Self-pay | Admitting: Primary Care

## 2024-06-08 DIAGNOSIS — G8929 Other chronic pain: Secondary | ICD-10-CM

## 2024-06-08 DIAGNOSIS — I5032 Chronic diastolic (congestive) heart failure: Secondary | ICD-10-CM

## 2024-06-08 DIAGNOSIS — I10 Essential (primary) hypertension: Secondary | ICD-10-CM

## 2024-06-08 DIAGNOSIS — D509 Iron deficiency anemia, unspecified: Secondary | ICD-10-CM

## 2024-06-08 DIAGNOSIS — R7303 Prediabetes: Secondary | ICD-10-CM

## 2024-06-10 NOTE — Telephone Encounter (Signed)
 Pls submit PA for Wegovy  2.4 mg.

## 2024-06-10 NOTE — Telephone Encounter (Signed)
Can we check on the PA for this?

## 2024-06-11 ENCOUNTER — Telehealth: Payer: Self-pay

## 2024-06-11 ENCOUNTER — Other Ambulatory Visit (HOSPITAL_COMMUNITY): Payer: Self-pay

## 2024-06-11 NOTE — Telephone Encounter (Signed)
 Spoke with patient, she states she just spoke with pharmacy and they are saying medication needs a PA still.   Patient states last time PA was approved in March 2025 she had a $ 4 copay and is confused why this would have changed when her insurance and plan is still the same. Please advise.

## 2024-06-11 NOTE — Telephone Encounter (Signed)
 Noted (see other messages- 06/11/24 phn note by clicking blue 'View Conversation' link).

## 2024-06-11 NOTE — Telephone Encounter (Signed)
 Notified patient via mychart. See other encounter

## 2024-06-11 NOTE — Telephone Encounter (Signed)
 Pharmacy Patient Advocate Encounter  Received notification from [facility] that Prior Authorization for Wegovy  2.4 has been Cancelled due to PA not needed. Contacted [facility] and spoke with Inocente. Patient has approved PA for another 90 days. Ran test claim, patient copay for 28 day supply is $1,285.44.

## 2024-06-11 NOTE — Telephone Encounter (Signed)
 Pharmacy Patient Advocate Encounter   Received notification from Pt Calls Messages that prior authorization for Wegovy  2.4 is required/requested.   Insurance verification completed.   The patient is insured through [facility] .   Per test claim: PA required; PA submitted to above mentioned insurance via Latent Key/confirmation #/EOC [id number] Status is pending

## 2024-06-12 ENCOUNTER — Other Ambulatory Visit (HOSPITAL_COMMUNITY): Payer: Self-pay

## 2024-06-26 ENCOUNTER — Encounter: Payer: Self-pay | Admitting: Primary Care

## 2024-06-26 ENCOUNTER — Ambulatory Visit (INDEPENDENT_AMBULATORY_CARE_PROVIDER_SITE_OTHER)

## 2024-06-26 DIAGNOSIS — Z3042 Encounter for surveillance of injectable contraceptive: Secondary | ICD-10-CM | POA: Diagnosis not present

## 2024-06-26 MED ORDER — MEDROXYPROGESTERONE ACETATE 150 MG/ML IM SUSP
150.0000 mg | INTRAMUSCULAR | Status: AC
  Administered 2024-06-26: 150 mg via INTRAMUSCULAR

## 2024-06-26 NOTE — Progress Notes (Signed)
 Per orders of [employer] DPN AGNP-C, injection of Depo provera  150 mg IM given by Laray Arenas in [facility] per pt request. Patient tolerated injection well. Patient will make appointment for 3 month.

## 2024-08-03 ENCOUNTER — Other Ambulatory Visit: Payer: Self-pay | Admitting: Primary Care

## 2024-08-03 DIAGNOSIS — E559 Vitamin D deficiency, unspecified: Secondary | ICD-10-CM

## 2024-08-04 DIAGNOSIS — R7303 Prediabetes: Secondary | ICD-10-CM

## 2024-08-04 DIAGNOSIS — I10 Essential (primary) hypertension: Secondary | ICD-10-CM

## 2024-08-04 DIAGNOSIS — I5032 Chronic diastolic (congestive) heart failure: Secondary | ICD-10-CM

## 2024-08-04 MED ORDER — ZEPBOUND 2.5 MG/0.5ML ~~LOC~~ SOAJ: 2.5000 mg | SUBCUTANEOUS | 0 refills | Status: DC

## 2024-08-07 ENCOUNTER — Telehealth: Payer: Self-pay

## 2024-08-07 ENCOUNTER — Other Ambulatory Visit (HOSPITAL_COMMUNITY): Payer: Self-pay

## 2024-08-07 DIAGNOSIS — R0683 Snoring: Secondary | ICD-10-CM

## 2024-08-07 NOTE — Telephone Encounter (Signed)
 Please call patient:  Let her know that Zepbound  is not covered unless we have a diagnosis of moderate sleep apnea.   Has she ever undergone a sleep study? Does she snore?

## 2024-08-07 NOTE — Telephone Encounter (Signed)
 [employer]  No OSA or sleep study in chart  Pharmacy Patient Advocate Encounter   Received notification from Onbase that prior authorization for Zepbound  2.5 is required/requested.   Insurance verification completed.   The patient is insured through [facility] MEDICAID.   Per test claim: Effective October 1st, Medicaid discontinued coverage of GLP1 medications for weight loss (such as Wegovy  and Zepbound ), unless the patient has a documented history of a heart attack or stroke. Zepbound  will continue to be covered only for patients with moderate to severe sleep apnea (AHI 15-30) and a BMI greater than 40. Because of this change, the prior authorization team will not be submitting new PA requests for GLP1 medications prescribed for weight loss, as patients will be unable to continue therapy under Medicaid coverage.   RTS LF 08/04/24 NF 08/25/24

## 2024-08-08 NOTE — Addendum Note (Signed)
 Addended by: Antwon Rochin K on: 08/08/2024 10:18 AM   Modules accepted: Orders

## 2024-08-08 NOTE — Telephone Encounter (Signed)
 Spoke with pt relaying Kate's message and asking her questions. Pt verbalizes understanding. States she has never done a sleep study but she does snore. Pt states she is willing to do sleep study, if Mallie thinks it would help.

## 2024-08-08 NOTE — Telephone Encounter (Signed)
 It could help.   I will place a referral to pulmonology for the sleep study.  Tell her to monitor her MyChart portal for a letter from them to get scheduled.  They may call her instead.

## 2024-08-08 NOTE — Telephone Encounter (Signed)
 Spoke with pt relaying Kate's message. Pt verbalizes understanding and expresses her thanks for all the help.

## 2024-08-12 ENCOUNTER — Ambulatory Visit: Admitting: Primary Care

## 2024-08-27 ENCOUNTER — Ambulatory Visit: Admitting: Primary Care

## 2024-09-16 ENCOUNTER — Encounter: Payer: Self-pay | Admitting: Primary Care

## 2024-09-16 ENCOUNTER — Ambulatory Visit: Admitting: Primary Care

## 2024-09-16 ENCOUNTER — Ambulatory Visit

## 2024-09-16 VITALS — BP 118/84 | HR 93 | Temp 98.3°F | Ht 59.0 in | Wt 269.1 lb

## 2024-09-16 DIAGNOSIS — Z3042 Encounter for surveillance of injectable contraceptive: Secondary | ICD-10-CM

## 2024-09-16 DIAGNOSIS — Z6841 Body Mass Index (BMI) 40.0 and over, adult: Secondary | ICD-10-CM | POA: Diagnosis not present

## 2024-09-16 MED ORDER — MEDROXYPROGESTERONE ACETATE 150 MG/ML IM SUSY
150.0000 mg | PREFILLED_SYRINGE | INTRAMUSCULAR | Status: AC
  Administered 2024-09-16: 150 mg via INTRAMUSCULAR

## 2024-09-16 NOTE — Assessment & Plan Note (Signed)
 Deteriorated since off Wegovy .  She is aware that she needs to cut back on snacking, sweets, sodas.  We will try to get Zepbound  approved in the early new year as she is pending a sleep study. She will update what she has her sleep study results.

## 2024-09-16 NOTE — Patient Instructions (Signed)
 Please notify me once you have your sleep study results.  It was a pleasure to see you today!

## 2024-09-16 NOTE — Progress Notes (Signed)
 Per orders of Mallie Gaskins, DPN AGNP-C, injection of depo provera  150 mg IM given by Laray Arenas in right upper outer quadrant per pt request.. Patient tolerated injection well. Patient will make appointment for 3 month.

## 2024-09-16 NOTE — Progress Notes (Signed)
 "  Subjective:    Patient ID: Suzanne [name], female    DOB: 1986/05/11, 38 y.o.   MRN: [id number]  Suzanne [name] is a very pleasant 38 y.o. female with a history of morbid obesity, hypertension, CHF, prediabetes who presents today for follow up of obesity.  She was last evaluated in September 2025.  During that visit she was managed on Wegovy  1.7 mg weekly for weight loss increase to 2.4 mg weekly.  Shortly thereafter her insurance discontinued coverage of Wegovy  and she was switched to Zepbound  2.5 mg weekly.  Today she discusses that she has yet to start Zepbound  due to cost. She has an appointment scheduled in early January 2026 with pulmonology. She is frustrated with her weight gain. She has noticed increased consumption of sweets, sodas, and fast food. She's been snacking more at night. She continues to eat 2 meals per day. She is trying to walk.   She is considering weight loss surgery.   Wt Readings from Last 3 Encounters:  09/16/24 269 lb 2 oz (122.1 kg)  05/28/24 260 lb (117.9 kg)  02/26/24 267 lb (121.1 kg)   BP Readings from Last 3 Encounters:  09/16/24 118/84  05/28/24 130/68  05/23/24 115/82     Review of Systems  Respiratory:  Negative for shortness of breath.   Cardiovascular:  Negative for chest pain.  Neurological:  Negative for dizziness and headaches.         Past Medical History:  Diagnosis Date   Acid reflux    Acute diastolic CHF (congestive heart failure) (HCC) 10/18/2013   -happened within one week of delivery (2015), along with blood transfusion, pulmonary edema and infection at C/S site     Blood transfusion without reported diagnosis    CHF (congestive heart failure) (HCC)    Heart failure with preserved ejection fraction [facility]) 02/12/2021   02/11/2021: pt met with [facility], now is MD ONLY patient, added to Red Chart. Below is from cardiology note from 02/12/2021, please see note for additional details.     She currently is not on any  antihypertensive medications.  But we are going to watch the patient closely and have low threshold to start her on antihypertensive medication to reduce cardiovascular complications during this pre   History of C-section 02/02/2021   For fetal intolerance of labor C/S x1 [facility] VBAC   Hypertension    Morbid obesity (HCC) 02/12/2021   Postpartum care following cesarean delivery 08/10/2021   Ptyalism 04/11/2013   Sickle cell trait     Social History   Socioeconomic History   Marital status: Single    Spouse name: Not on file   Number of children: Not on file   Years of education: Not on file   Highest education level: Some college, no degree  Occupational History   Not on file  Tobacco Use   Smoking status: Never   Smokeless tobacco: Never  Vaping Use   Vaping status: Never Used  Substance and Sexual Activity   Alcohol use: No   Drug use: No   Sexual activity: Not Currently    Partners: Male    Birth control/protection: Injection  Other Topics Concern   Not on file  Social History Narrative   Completed some college   Works at [employer]   From [place]   Has one son, 7 years.   Enjoys playing with her son, sleeping.   Family is established here as well.   Social Drivers of Health  Tobacco Use: Low Risk (09/16/2024)   Patient History    Smoking Tobacco Use: Never    Smokeless Tobacco Use: Never    Passive Exposure: Not on file  Financial Resource Strain: Low Risk (09/15/2024)   Overall Financial Resource Strain (CARDIA)    Difficulty of Paying Living Expenses: Not hard at all  Food Insecurity: No Food Insecurity (09/15/2024)   Epic    Worried About Radiation Protection Practitioner of Food in the Last Year: Never true    Ran Out of Food in the Last Year: Never true  Transportation Needs: No Transportation Needs (09/15/2024)   Epic    Lack of Transportation (Medical): No    Lack of Transportation (Non-Medical): No  Physical Activity: Inactive (09/15/2024)   Exercise Vital Sign     Days of Exercise per Week: 0 days    Minutes of Exercise per Session: Not on file  Stress: No Stress Concern Present (09/15/2024)   [employer] of Occupational Health - Occupational Stress Questionnaire    Feeling of Stress: Not at all  Social Connections: Moderately Isolated (09/15/2024)   Social Connection and Isolation Panel    Frequency of Communication with Friends and Family: More than three times a week    Frequency of Social Gatherings with Friends and Family: More than three times a week    Attends Religious Services: More than 4 times per year    Active Member of [employer] or Organizations: No    Attends Engineer, Structural: Not on file    Marital Status: Never married  Intimate Partner Violence: Unknown (12/30/2021)   Received from [facility]   HITS    Physically Hurt: Not on file    Insult or Talk Down To: Not on file    Threaten Physical Harm: Not on file    Scream or Curse: Not on file  Depression (PHQ2-9): Low Risk (09/16/2024)   Depression (PHQ2-9)    PHQ-2 Score: 0  Alcohol Screen: Low Risk (05/26/2024)   Alcohol Screen    Last Alcohol Screening Score (AUDIT): 1  Housing: Unknown (09/15/2024)   Epic    Unable to Pay for Housing in the Last Year: No    Number of Times Moved in the Last Year: Not on file    Homeless in the Last Year: No  Utilities: Not on file  Health Literacy: Not on file    Past Surgical History:  Procedure Laterality Date   CESAREAN SECTION N/A 10/08/2013   Procedure: CESAREAN SECTION;  Surgeon: Olam Mill, MD;  Location: [facility] ORS;  Service: Obstetrics;  Laterality: N/A;   CESAREAN SECTION N/A 07/10/2021   Procedure: CESAREAN SECTION;  Surgeon: Jayne Vonn DEL, MD;  Location: [facility] LD ORS;  Service: Obstetrics;  Laterality: N/A;   TOOTH EXTRACTION N/A 12/01/2022   Procedure: EXTRACTION TEETH NUMBER ONE, SIXTEEN, SEVENTEEN, THIRTY TWO;  Surgeon: Sheryle Hamilton, DMD;  Location: [facility] OR;  Service: Oral Surgery;  Laterality: N/A;     Family History  Problem Relation Age of Onset   Diabetes Mother    Arthritis Mother    Hypertension Mother    Diabetes Maternal Grandmother    Hypertension Maternal Grandmother    Cancer Neg Hx    Heart disease Neg Hx     Allergies[1]  Medications Ordered Prior to Encounter[2]  BP 118/84   Pulse 93   Temp 98.3 F (36.8 C) (Oral)   Ht 4' 11 (1.499 m)   Wt 269 lb 2 oz (122.1 kg)   SpO2  98%   BMI 54.36 kg/m  Objective:   Physical Exam Cardiovascular:     Rate and Rhythm: Normal rate and regular rhythm.  Pulmonary:     Effort: Pulmonary effort is normal.     Breath sounds: Normal breath sounds.  Musculoskeletal:     Cervical back: Neck supple.  Skin:    General: Skin is warm and dry.  Neurological:     Mental Status: She is alert and oriented to person, place, and time.  Psychiatric:        Mood and Affect: Mood normal.     Physical Exam        Assessment & Plan:  Morbid obesity with BMI of 50.0-59.9, adult [facility]) Assessment & Plan: Deteriorated since off Wegovy .  She is aware that she needs to cut back on snacking, sweets, sodas.  We will try to get Zepbound  approved in the early new year as she is pending a sleep study. She will update what she has her sleep study results.     Assessment and Plan Assessment & Plan         Comer MARLA Gaskins, NP       [1]  Allergies Allergen Reactions   Hydralazine  Itching   Lasix  [Furosemide ] Swelling    Mouth and right side of face with oral furosemide . Pt can tolerate IV form   [2]  Current Outpatient Medications on File Prior to Visit  Medication Sig Dispense Refill   amLODipine  (NORVASC ) 5 MG tablet Take 1 tablet (5 mg total) by mouth daily. 90 tablet 3   fluticasone  (FLONASE ) 50 MCG/ACT nasal spray PLACE 1 SPRAY INTO BOTH NOSTRILS 2 (TWO) TIMES DAILY 48 mL 0   ibuprofen  (ADVIL ) 600 MG tablet TAKE 1 TABLET BY MOUTH EVERY 6 HOURS 30 tablet 5   medroxyPROGESTERone  (DEPO-PROVERA ) 150 MG/ML  injection Inject 150 mg into the muscle every 3 (three) months.     pantoprazole  (PROTONIX ) 20 MG tablet TAKE 1 TABLET (20 MG TOTAL) BY MOUTH DAILY. FOR HEARTBURN. 90 tablet 2   Vitamin D , Ergocalciferol , (DRISDOL ) 1.25 MG (50000 UNIT) CAPS capsule Take 1 capsule by mouth once weekly for vitamin D  4 capsule 0   tirzepatide  (ZEPBOUND ) 2.5 MG/0.5ML Pen Inject 2.5 mg into the skin once a week. (Patient not taking: Reported on 09/16/2024) 2 mL 0   Current Facility-Administered Medications on File Prior to Visit  Medication Dose Route Frequency Provider Last Rate Last Admin   medroxyPROGESTERone  (DEPO-PROVERA ) injection 150 mg  150 mg Intramuscular Q90 days Starla Deller K, NP   150 mg at 07/27/23 0935   medroxyPROGESTERone  (DEPO-PROVERA ) injection 150 mg  150 mg Intramuscular Q90 days Xaviar Lunn K, NP   150 mg at 10/16/23 1046   medroxyPROGESTERone  (DEPO-PROVERA ) injection 150 mg  150 mg Intramuscular Q90 days Iyanla Eilers K, NP   150 mg at 01/10/24 9061   medroxyPROGESTERone  (DEPO-PROVERA ) injection 150 mg  150 mg Intramuscular Q90 days Killian Schwer K, NP   150 mg at 03/27/24 1150   medroxyPROGESTERone  (DEPO-PROVERA ) injection 150 mg  150 mg Intramuscular Q90 days Wendee Lynwood HERO, NP   150 mg at 06/26/24 1257   "

## 2024-09-29 ENCOUNTER — Other Ambulatory Visit: Payer: Self-pay | Admitting: Primary Care

## 2024-09-29 DIAGNOSIS — R7303 Prediabetes: Secondary | ICD-10-CM

## 2024-09-29 DIAGNOSIS — E559 Vitamin D deficiency, unspecified: Secondary | ICD-10-CM

## 2024-09-29 DIAGNOSIS — I10 Essential (primary) hypertension: Secondary | ICD-10-CM

## 2024-09-29 DIAGNOSIS — I5032 Chronic diastolic (congestive) heart failure: Secondary | ICD-10-CM

## 2024-09-30 ENCOUNTER — Encounter: Payer: Self-pay | Admitting: Primary Care

## 2024-09-30 ENCOUNTER — Ambulatory Visit (INDEPENDENT_AMBULATORY_CARE_PROVIDER_SITE_OTHER): Admitting: Primary Care

## 2024-09-30 ENCOUNTER — Ambulatory Visit: Admitting: Primary Care

## 2024-09-30 VITALS — BP 126/70 | HR 90 | Temp 97.7°F | Ht <= 58 in | Wt 272.4 lb

## 2024-09-30 DIAGNOSIS — R0683 Snoring: Secondary | ICD-10-CM

## 2024-09-30 NOTE — Progress Notes (Signed)
 "  @Patient  ID: Suzanne [name], female    DOB: 26-Feb-1986, 39 y.o.   MRN: [id number]  Chief Complaint  Patient presents with   Consult    Snoring- never tested or diagnosed. Pt's PCP would like pt to be placed on Zepbound      Referring provider: Gretta Comer POUR, NP  HPI: 39 year old female, never smoked. PMH significant for HTN, CHF, prediabetes, obesity.   09/30/2024 Discussed the use of AI scribe software for clinical note transcription with the patient, who gave verbal consent to proceed.  History of Present Illness Suzanne [name] is a 39 year old female who presents with snoring and sleep disturbances.  She experiences significant snoring, which sometimes wakes her up. She does not wake up gasping or choking, but occasionally wakes up coughing. Her typical bedtime is around midnight after settling her children, and she falls asleep quickly.  She wakes up multiple times throughout the night, primarily due to her three children, including a three-year-old who still wakes up for a bottle. Her day starts at 5:45 AM, resulting in less than six hours of sleep per night. 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 No falling asleep while driving.  Her mother has a history of sleep apnea and uses a CPAP machine.  No concern for narcolepsy or cataplexy.  Allergies[1]  Immunization History  Administered Date(s) Administered   Influenza,inj,Quad PF,6+ Mos 09/01/2021   Td 03/17/1993, 04/18/2007   Tdap 10/10/2013, 07/11/2021    Past Medical History:  Diagnosis Date   Acid reflux    Acute diastolic CHF (congestive heart failure) (HCC) 10/18/2013   -happened within one week of delivery (2015), along with blood transfusion, pulmonary edema and infection at C/S site     Blood transfusion without reported diagnosis    CHF (congestive heart failure) (HCC)    Heart failure with preserved ejection fraction [facility]) 02/12/2021    02/11/2021: pt met with [facility], now is MD ONLY patient, added to Red Chart. Below is from cardiology note from 02/12/2021, please see note for additional details.     She currently is not on any antihypertensive medications.  But we are going to watch the patient closely and have low threshold to start her on antihypertensive medication to reduce cardiovascular complications during this pre   History of C-section 02/02/2021   For fetal intolerance of labor C/S x1 [facility] VBAC   Hypertension    Morbid obesity (HCC) 02/12/2021   Postpartum care following cesarean delivery 08/10/2021   Ptyalism 04/11/2013   Sickle cell trait     Tobacco History: Tobacco Use History[2] Counseling given: Not Answered   Outpatient Medications Prior to Visit  Medication Sig Dispense Refill   amLODipine  (NORVASC ) 5 MG tablet Take 1 tablet (5 mg total) by mouth daily. 90 tablet 3   fluticasone  (FLONASE ) 50 MCG/ACT nasal spray PLACE 1 SPRAY INTO BOTH NOSTRILS 2 (TWO) TIMES DAILY 48 mL 0   ibuprofen  (ADVIL ) 600 MG tablet TAKE 1 TABLET BY MOUTH EVERY 6 HOURS 30 tablet 5   medroxyPROGESTERone  (DEPO-PROVERA ) 150 MG/ML injection Inject 150 mg into the muscle every 3 (three) months.     pantoprazole  (PROTONIX ) 20 MG tablet TAKE 1 TABLET (20 MG TOTAL) BY MOUTH DAILY. FOR HEARTBURN. 90 tablet 2   Vitamin D , Ergocalciferol , (DRISDOL ) 1.25 MG (50000 UNIT) CAPS capsule TAKE 1 CAPSULE BY MOUTH ONCE WEEKLY FOR VITAMIN D  12 capsule 0  tirzepatide  (ZEPBOUND ) 2.5 MG/0.5ML Pen Inject 2.5 mg into the skin once a week. (Patient not taking: Reported on 09/30/2024) 2 mL 0   Facility-Administered Medications Prior to Visit  Medication Dose Route Frequency Provider Last Rate Last Admin   medroxyPROGESTERone  (DEPO-PROVERA ) injection 150 mg  150 mg Intramuscular Q90 days Clark, Katherine K, NP   150 mg at 07/27/23 0935   medroxyPROGESTERone  (DEPO-PROVERA ) injection 150 mg  150 mg Intramuscular Q90 days Clark, Katherine K, NP   150  mg at 10/16/23 1046   medroxyPROGESTERone  (DEPO-PROVERA ) injection 150 mg  150 mg Intramuscular Q90 days Clark, Katherine K, NP   150 mg at 01/10/24 9061   medroxyPROGESTERone  (DEPO-PROVERA ) injection 150 mg  150 mg Intramuscular Q90 days Clark, Katherine K, NP   150 mg at 03/27/24 1150   medroxyPROGESTERone  (DEPO-PROVERA ) injection 150 mg  150 mg Intramuscular Q90 days Wendee Lynwood HERO, NP   150 mg at 06/26/24 1257      Review of Systems  Review of Systems  Constitutional: Negative.   HENT: Negative.    Respiratory: Negative.    Cardiovascular: Negative.      Physical Exam  BP 126/70   Pulse 90   Temp 97.7 F (36.5 C)   Ht 4' 10 (1.473 m) Comment: pt stated  Wt 272 lb 6.4 oz (123.6 kg)   SpO2 97% Comment: ra  BMI 56.93 kg/m  Physical Exam Constitutional:      Appearance: Normal appearance. She is well-developed.  HENT:     Head: Normocephalic and atraumatic.     Mouth/Throat:     Mouth: Mucous membranes are moist.     Pharynx: Oropharynx is clear.     Tonsils: 3+ on the right. 3+ on the left.  Eyes:     Pupils: Pupils are equal, round, and reactive to light.  Cardiovascular:     Rate and Rhythm: Normal rate and regular rhythm.     Heart sounds: Normal heart sounds. No murmur heard. Pulmonary:     Effort: Pulmonary effort is normal. No respiratory distress.     Breath sounds: Normal breath sounds. No wheezing or rhonchi.  Musculoskeletal:        General: Normal range of motion.     Cervical back: Normal range of motion and neck supple.  Skin:    General: Skin is warm and dry.     Findings: No erythema or rash.  Neurological:     General: No focal deficit present.     Mental Status: She is alert and oriented to person, place, and time. Mental status is at baseline.  Psychiatric:        Mood and Affect: Mood normal.        Behavior: Behavior normal.        Thought Content: Thought content normal.        Judgment: Judgment normal.     Lab Results:  CBC     Component Value Date/Time   WBC 7.9 11/26/2023 0923   RBC 5.30 (H) 11/26/2023 0923   HGB 10.9 (L) 11/26/2023 0923   HGB 10.7 (L) 12/07/2021 0935   HCT 35.8 (L) 11/26/2023 0923   HCT 35.1 12/07/2021 0935   PLT 341.0 11/26/2023 0923   PLT 368 12/07/2021 0935   MCV 67.5 Repeated and verified X2. (L) 11/26/2023 0923   MCV 66 (L) 12/07/2021 0935   MCH 21.7 (L) 12/01/2022 0550   MCHC 30.4 11/26/2023 0923   RDW 18.5 (H) 11/26/2023 0923   RDW 18.6 (  H) 12/07/2021 0935   LYMPHSABS 2.7 12/07/2021 0935   MONOABS 1.4 (H) 07/13/2021 0527   EOSABS 0.2 12/07/2021 0935   BASOSABS 0.0 12/07/2021 0935    BMET    Component Value Date/Time   NA 138 12/27/2022 0901   NA 139 09/22/2021 0840   K 4.1 12/27/2022 0901   CL 105 12/27/2022 0901   CO2 27 12/27/2022 0901   GLUCOSE 105 (H) 12/27/2022 0901   BUN 10 12/27/2022 0901   BUN 11 09/22/2021 0840   CREATININE 0.60 12/27/2022 0901   CALCIUM 9.2 12/27/2022 0901   GFRNONAA >60 12/01/2022 0550   GFRAA >90 10/18/2013 0510    BNP No results found for: BNP  ProBNP    Component Value Date/Time   PROBNP 1,537.0 (H) 10/15/2013 1345    Imaging: No results found.   Assessment & Plan:   1. Loud snoring (Primary) - Home sleep test; Future  Assessment and Plan Assessment & Plan Loud snoring Suspected sleep apnea due to snoring, family history, and daytime sleepiness. No gasping or choking during sleep. Large tonsils may contribute to airway obstruction. Untreated sleep apnea can increase risks for cardiac arrhythmias, stroke, pulmonary hypertension, and diabetes. Weight loss is a potential treatment option if moderate or severe sleep apnea is confirmed. - Ordered home sleep study through [facility]. - Advised sleeping on her side or using a wedge pillow to elevate her head. - Provided information on sleep apnea. - Will discuss potential treatment options including weight loss, oral appliance, CPAP, and ENT referral for surgical  options if sleep apnea is confirmed. - Will consider Zepbound  for weight loss if moderate or severe sleep apnea is confirmed.   Suzanne LELON Ferrari, NP 09/30/2024     [1]  Allergies Allergen Reactions   Hydralazine  Itching   Lasix  [Furosemide ] Swelling    Mouth and right side of face with oral furosemide . Pt can tolerate IV form   [2]  Social History Tobacco Use  Smoking Status Never  Smokeless Tobacco Never   "

## 2024-09-30 NOTE — Patient Instructions (Signed)
 "  VISIT SUMMARY: Today, we discussed your snoring and sleep disturbances. You mentioned experiencing significant snoring that sometimes wakes you up, and you often wake up multiple times during the night due to your children. You also experience daytime sleepiness, especially at work. Given your symptoms and family history, we suspect obstructive sleep apnea.  YOUR PLAN: -SUSPECTED OBSTRUCTIVE SLEEP APNEA: Obstructive sleep apnea is a condition where the airway becomes blocked during sleep, causing breathing to stop and start repeatedly. This can lead to daytime sleepiness and other health issues. We have ordered a home sleep study through [facility] to confirm the diagnosis. In the meantime, try sleeping on your side or using a wedge pillow to elevate your head. We provided you with information on sleep apnea and will discuss potential treatment options, including weight loss, oral appliances, CPAP, and possibly a referral to an ENT specialist for surgical options if sleep apnea is confirmed. If moderate or severe sleep apnea is confirmed, we will also consider Zepbound  for weight loss.  INSTRUCTIONS: Please complete the home sleep study as ordered. Follow up with us  once the results are available so we can discuss the next steps in your treatment plan.         Sleep Apnea  Sleep apnea is a condition that affects your breathing while you are sleeping. Your tongue or soft tissue in your throat may block the flow of air while you sleep. You may have shallow breathing or stop breathing for short periods of time. People with sleep apnea may snore loudly. There are three kinds of sleep apnea: Obstructive sleep apnea. This kind is caused by a blocked or collapsed airway. This is the most common. Central sleep apnea. This kind happens when the part of the brain that controls breathing does not send the correct signals to the muscles that control breathing. Mixed sleep apnea. This is a  combination of obstructive and central sleep apnea. What are the causes? The most common cause of sleep apnea is a collapsed or blocked airway. What increases the risk? Being very overweight. Having family members with sleep apnea. Having a tongue or tonsils that are larger than normal. Having a small airway or jaw problems. Being older. What are the signs or symptoms? Loud snoring. Restless sleep. Trouble staying asleep. Being sleepy or tired during the day. Waking up gasping or choking. Having a headache in the morning. Mood swings. Having a hard time remembering things and concentrating. How is this diagnosed? A medical history. A physical exam. A sleep study. This is also called a polysomnography test. This test is done at a sleep lab or in your home while you are sleeping. How is this treated? Treatment may include: Sleeping on your side. Losing weight if you're overweight. Wearing an oral appliance. This is a mouthpiece that moves your lower jaw forward. Using a positive airway pressure (PAP) device to keep your airways open while you sleep, such as: A continuous positive airway pressure (CPAP) device. This device gives forced air through a mask when you breathe out. This keeps your airways open. A bilevel positive airway pressure (BIPAP) device. This device gives forced air through a mask when you breathe in and when you breathe out to keep your airways open. Having surgery if other treatments do not work. If your sleep apnea is not treated, you may be at risk for: Heart failure. Heart attack. Stroke. Type 2 diabetes or a problem with your blood sugar called insulin resistance. Follow these instructions  at home: Medicines Take your medicines only as told by your health care provider. Avoid alcohol, medicines to help you relax, and certain pain medicines. These may make sleep apnea worse. General instructions Do not smoke, vape, or use products with nicotine or tobacco  in them. If you need help quitting, talk with your provider. If you were given a PAP device to open your airway while you sleep, use it as told by your provider. If you're having surgery, make sure to tell your provider you have sleep apnea. You may need to bring your PAP device with you. Contact a health care provider if: The PAP device that you were given to use during sleep bothers you or does not seem to be working. You do not feel better or you feel worse. Get help right away if: You have trouble breathing. You have chest pain. You have trouble talking. One side of your body feels weak. A part of your face is hanging down. These symptoms may be an emergency. Call 911 right away. Do not wait to see if the symptoms will go away. Do not drive yourself to the hospital. This information is not intended to replace advice given to you by your health care provider. Make sure you discuss any questions you have with your health care provider. Document Revised: 06/14/2023 Document Reviewed: 11/16/2022 Elsevier Patient Education  2024 [employer]. "

## 2024-10-01 DIAGNOSIS — I5032 Chronic diastolic (congestive) heart failure: Secondary | ICD-10-CM

## 2024-10-01 DIAGNOSIS — R7303 Prediabetes: Secondary | ICD-10-CM

## 2024-10-01 DIAGNOSIS — I10 Essential (primary) hypertension: Secondary | ICD-10-CM

## 2024-10-01 MED ORDER — WEGOVY 0.25 MG/0.5ML ~~LOC~~ SOAJ: 0.2500 mg | SUBCUTANEOUS | 0 refills | Status: DC

## 2024-10-02 ENCOUNTER — Other Ambulatory Visit (HOSPITAL_COMMUNITY): Payer: Self-pay

## 2024-10-02 ENCOUNTER — Telehealth: Payer: Self-pay

## 2024-10-02 NOTE — Telephone Encounter (Signed)
 Pls submit PA for Wegovy  0.25 mg.

## 2024-10-02 NOTE — Telephone Encounter (Signed)
 Copied from CRM [id number]. Topic: General - Call Back - No Documentation >> Oct 02, 2024 11:31 AM Rea BROCKS wrote: Reason for CRM: [place]  [employer]icaid - trying to get missing information for prior authorization that was submitted to them for patient. Need most recent weight and BMI and confirm if the patient is on and will continue lifestyle modifications.   [phone] - for call center (M)

## 2024-10-02 NOTE — Telephone Encounter (Signed)
 Pharmacy Patient Advocate Encounter   Received notification from [facility] that prior authorization for Wegovy  0.25 is required/requested.   Insurance verification completed.   The patient is insured through [facility] MEDICAID.   Per test claim: PA required; PA submitted to above mentioned insurance via Latent Key/confirmation #/EOC [id number] Status is pending

## 2024-10-03 NOTE — Telephone Encounter (Signed)
 Looks like a message was sent to Rx PA Team (see 10/02/24 phn note).

## 2024-10-06 ENCOUNTER — Other Ambulatory Visit: Payer: Self-pay | Admitting: Primary Care

## 2024-10-06 DIAGNOSIS — I10 Essential (primary) hypertension: Secondary | ICD-10-CM

## 2024-10-06 DIAGNOSIS — R7303 Prediabetes: Secondary | ICD-10-CM

## 2024-10-06 DIAGNOSIS — I5032 Chronic diastolic (congestive) heart failure: Secondary | ICD-10-CM

## 2024-10-06 NOTE — Telephone Encounter (Signed)
 BMI is 56.9  Weight is 272.4 lbs Yes she will continue with healthy lifestyle practices.

## 2024-10-06 NOTE — Telephone Encounter (Signed)
 See 10/02/24 phn note.

## 2024-10-07 MED ORDER — WEGOVY 0.25 MG/0.5ML ~~LOC~~ SOAJ: 0.2500 mg | SUBCUTANEOUS | 0 refills | Status: AC

## 2024-10-07 NOTE — Addendum Note (Signed)
 Addended by: Anvi Mangal K on: 10/07/2024 01:52 PM   Modules accepted: Orders

## 2024-10-08 NOTE — Telephone Encounter (Signed)
 Can we check on her Wegovy  PA?

## 2024-10-14 NOTE — Telephone Encounter (Signed)
 Can we check on the Wegovy ?   Her BMI is 56.9 and she is working on lifestyle changes.

## 2024-10-15 ENCOUNTER — Other Ambulatory Visit: Payer: Self-pay | Admitting: Cardiology

## 2024-10-15 NOTE — Telephone Encounter (Signed)
 Can we submit information needed to her insurance so that we can get her Wegovy  approved? She has been waiting a long time.

## 2024-10-15 NOTE — Telephone Encounter (Signed)
 I have restarted auth code  [id number]

## 2024-10-17 ENCOUNTER — Other Ambulatory Visit (HOSPITAL_COMMUNITY): Payer: Self-pay

## 2024-10-17 ENCOUNTER — Encounter: Payer: Self-pay | Admitting: Cardiology

## 2024-10-17 MED ORDER — AMLODIPINE BESYLATE 5 MG PO TABS: 5.0000 mg | ORAL_TABLET | Freq: Every day | ORAL | 3 refills | Status: AC

## 2024-10-22 ENCOUNTER — Telehealth: Payer: Self-pay | Admitting: Pharmacist

## 2024-10-22 ENCOUNTER — Other Ambulatory Visit (HOSPITAL_COMMUNITY): Payer: Self-pay

## 2024-10-22 NOTE — Telephone Encounter (Signed)
 In order to file a strong appeal, clinic notes will need to include detailed documentation of lifestyle modifications. This documentation must include specific diet recommendations (such as caloric intake), exercise plans (including frequency and duration per week), and clear evidence of the patients commitment to continuing these efforts while on medication.  Please advise.  Thank you, Devere Pandy, PharmD Clinical Pharmacist  [place]  Direct Dial: [phone]   Insurance Denial:

## 2024-10-22 NOTE — Telephone Encounter (Signed)
 Pharmacy Patient Advocate Encounter  Received notification from [facility] MEDICAID that Prior Authorization for Wegovy  0.25mg /0.71ml has been DENIED.  See denial reason below. No denial letter attached in CMM. Will attach denial letter to Media tab once received.   PA #/Case ID/Reference #: [id number]

## 2024-10-23 ENCOUNTER — Telehealth: Admitting: Primary Care

## 2024-10-23 ENCOUNTER — Telehealth: Payer: Self-pay | Admitting: Pharmacist

## 2024-10-23 VITALS — Wt 272.0 lb

## 2024-10-23 DIAGNOSIS — N939 Abnormal uterine and vaginal bleeding, unspecified: Secondary | ICD-10-CM | POA: Diagnosis not present

## 2024-10-23 DIAGNOSIS — E66813 Obesity, class 3: Secondary | ICD-10-CM

## 2024-10-23 DIAGNOSIS — Z6841 Body Mass Index (BMI) 40.0 and over, adult: Secondary | ICD-10-CM

## 2024-10-23 NOTE — Assessment & Plan Note (Signed)
 Unclear etiology.  We discussed metabolic causes, however labs such as thyroid  and A1c have been overall stable/unremarkable. We discussed that sometimes hormonal changes can cause breakthrough spotting.  We discussed obtaining a transvaginal/pelvic ultrasound for further evaluation.  She kindly declines for now as she would like to give it 1 additional month for monitoring.  If her spotting continues then she will proceed with ultrasound.

## 2024-10-23 NOTE — Progress Notes (Signed)
 "   Patient ID: Suzanne [name], female    DOB: 01/11/1986, 39 y.o.   MRN: [id number]  Virtual visit completed through [employer], a video enabled telemedicine application. Due to national recommendations of social distancing due to COVID-19, a virtual visit is felt to be most appropriate for this patient at this time. Reviewed limitations, risks, security and privacy concerns of performing a virtual visit and the availability of in person appointments. I also reviewed that there may be a patient responsible charge related to this service. The patient agreed to proceed.   Patient location: home Provider location: [place] at [facility], office Persons participating in this virtual visit: patient, provider   If any vitals were documented, they were collected by patient at home unless specified below.    Wt 272 lb (123.4 kg)   BMI 56.85 kg/m    CC:  Subjective:   HPI: Suzanne [name] is a 39 y.o. female with a history of class 3 severe obesity with BMI of 56, hypertension, CHF, chronic fatigue, prediabetes presenting on 10/23/2024 for wegovy   Previously managed on Wegovy , up to the 2.4 mg dose. She was doing well on treatment, lost 12 pounds from May 2025 to September 2025. Unfortunately, Medicaid decided to discontinue coverage for Wegovy  in October 2025. Wegovy  cash price was cost prohibitive.  Since being off Wegovy  she had difficulty with food cravings, especially at night. Her portion sizes have increased. She craved unhealthy food and sugary soda. When managed on Wegovy  she had no junk food or sugar cravings.   She is already working on lifestyle changes and plans to continue indefinitely. She will be joining the gym soon, plans to work out 3 days weekly. Currently she's trying back on sweets and sodas, but this has been difficult without Wegovy . She has increased intake of water . She would like to resume Wegovy  as she was successful previously.  May 2025 BMI: 54.98 June 2025 BMI:  53.93 September 2025 BMI: 52.51 December 2025 BMI: 54.36 January 2026 BMI: 56.93  She would also like to discuss breakthrough vaginal bleeding. Currently managed on Depo Provera  for which she's been on for about 1 year. Over the last few months she's noticed intermittent spotting several times throughout the month. Yesterday she had to wear a tampon and felt like she was going to start her menstrual cycle. Today she has no bleeding. She endorses being on time with all injections. She believes she may have had a uterine fibroid prior to her first child.  She has not missed any doses of her Depo-Provera .      Relevant past medical, surgical, family and social history reviewed and updated as indicated. Interim medical history since our last visit reviewed. Allergies and medications reviewed and updated. Outpatient Medications Prior to Visit  Medication Sig Dispense Refill   amLODipine  (NORVASC ) 5 MG tablet Take 1 tablet (5 mg total) by mouth daily. 90 tablet 3   fluticasone  (FLONASE ) 50 MCG/ACT nasal spray PLACE 1 SPRAY INTO BOTH NOSTRILS 2 (TWO) TIMES DAILY 48 mL 0   ibuprofen  (ADVIL ) 600 MG tablet TAKE 1 TABLET BY MOUTH EVERY 6 HOURS 30 tablet 5   medroxyPROGESTERone  (DEPO-PROVERA ) 150 MG/ML injection Inject 150 mg into the muscle every 3 (three) months.     pantoprazole  (PROTONIX ) 20 MG tablet TAKE 1 TABLET (20 MG TOTAL) BY MOUTH DAILY. FOR HEARTBURN. 90 tablet 2   semaglutide -weight management (WEGOVY ) 0.25 MG/0.5ML SOAJ SQ injection Inject 0.25 mg into the skin once a week. 2  mL 0   Vitamin D , Ergocalciferol , (DRISDOL ) 1.25 MG (50000 UNIT) CAPS capsule TAKE 1 CAPSULE BY MOUTH ONCE WEEKLY FOR VITAMIN D  12 capsule 0   Facility-Administered Medications Prior to Visit  Medication Dose Route Frequency Provider Last Rate Last Admin   medroxyPROGESTERone  (DEPO-PROVERA ) injection 150 mg  150 mg Intramuscular Q90 days Skyrah Krupp K, NP   150 mg at 07/27/23 0935   medroxyPROGESTERone   (DEPO-PROVERA ) injection 150 mg  150 mg Intramuscular Q90 days Kwana Ringel K, NP   150 mg at 10/16/23 1046   medroxyPROGESTERone  (DEPO-PROVERA ) injection 150 mg  150 mg Intramuscular Q90 days Avie Checo K, NP   150 mg at 01/10/24 9061   medroxyPROGESTERone  (DEPO-PROVERA ) injection 150 mg  150 mg Intramuscular Q90 days Remi Lopata K, NP   150 mg at 03/27/24 1150   medroxyPROGESTERone  (DEPO-PROVERA ) injection 150 mg  150 mg Intramuscular Q90 days Wendee Lynwood HERO, NP   150 mg at 06/26/24 1257     Per HPI unless specifically indicated in ROS section below Review of Systems  Respiratory:  Negative for shortness of breath.   Cardiovascular:  Negative for chest pain.  Gastrointestinal:  Negative for abdominal pain.  Genitourinary:  Positive for menstrual problem.  Neurological:  Negative for dizziness.   Objective:  Wt 272 lb (123.4 kg)   BMI 56.85 kg/m   Wt Readings from Last 3 Encounters:  10/23/24 272 lb (123.4 kg)  09/30/24 272 lb 6.4 oz (123.6 kg)  09/16/24 269 lb 2 oz (122.1 kg)       Physical exam: General: Alert and oriented x 3, no distress, does not appear sickly  Pulmonary: Speaks in complete sentences without increased work of breathing, no cough during visit.  Psychiatric: Normal mood, thought content, and behavior.     Results for orders placed or performed during the hospital encounter of 05/23/24  POC rapid strep A   Collection Time: 05/23/24  8:31 AM  Result Value Ref Range   Rapid Strep A Screen Negative Negative  POC mono screen   Collection Time: 05/23/24  9:00 AM  Result Value Ref Range   Mono, POC Negative Negative  Culture, group A strep (throat)   Collection Time: 05/23/24  9:05 AM   Specimen: Throat  Result Value Ref Range   Specimen Description THROAT    Special Requests NONE    Culture (A)     STREPTOCOCCUS,BETA HEMOLYTIC NOT GROUP A Beta hemolytic streptococci are predictably susceptible to penicillin  and other beta lactams.  Susceptibility testing not routinely performed. Performed at [facility], [address], [place], [place] [address]    Report Status 05/25/2024 FINAL    Assessment & Plan:   Problem List Items Addressed This Visit       Genitourinary   Abnormal uterine bleeding   Unclear etiology.  We discussed metabolic causes, however labs such as thyroid  and A1c have been overall stable/unremarkable. We discussed that sometimes hormonal changes can cause breakthrough spotting.  We discussed obtaining a transvaginal/pelvic ultrasound for further evaluation.  She kindly declines for now as she would like to give it 1 additional month for monitoring.  If her spotting continues then she will proceed with ultrasound.        Other   Class 3 severe obesity due to excess calories with body mass index (BMI) of 50.0 to 59.9 in adult [facility]) - Primary   Worse since discontinuation of Wegovy .  Given this patient's age, coupled with her comorbidities, I highly  recommend she resume Wegovy  injections. She is highly motivated to continue with her healthier lifestyle changes.  Start semaglutide  (Wegovy ) for weight loss. Start by injecting 0.25 mg into the skin once weekly for 4 weeks, then increase to 0.5 mg once weekly thereafter.    Follow up in 2-3 months.        No orders of the defined types were placed in this encounter.  No orders of the defined types were placed in this encounter.   I discussed the assessment and treatment plan with the patient. The patient was provided an opportunity to ask questions and all were answered. The patient agreed with the plan and demonstrated an understanding of the instructions. The patient was advised to call back or seek an in-person evaluation if the symptoms worsen or if the condition fails to improve as anticipated.  Follow up plan:  Start semaglutide  (Wegovy ) for weight loss. Start by injecting 0.25 mg into the skin once weekly for 4 weeks, then  increase to 0.5 mg once weekly thereafter.  Please notify me once you have used your last 0.25 mg pen so that I can send the 0.5 mg dose to your pharmacy.  Please update me if you continue to experience abnormal bleeding.  It was a pleasure to see you today!  Luchiano Viscomi K Brittane Grudzinski, NP   "

## 2024-10-23 NOTE — Telephone Encounter (Signed)
 Appeal has been submitted for Zepbound  utilizing the most recent telehealth visit from 01.29.2026 . Will advise when response is received, please be advised that most companies may take 30 days to make a decision. Appeal letter and clinical documentation have been faxed to [phone] on 10/23/2024 @3 :15 pm.  Thank you, Devere Pandy, PharmD Clinical Pharmacist  [place]  Direct Dial: [phone]

## 2024-10-23 NOTE — Assessment & Plan Note (Signed)
 Worse since discontinuation of Wegovy .  Given this patient's age, coupled with her comorbidities, I highly recommend she resume Wegovy  injections. She is highly motivated to continue with her healthier lifestyle changes.  Start semaglutide  (Wegovy ) for weight loss. Start by injecting 0.25 mg into the skin once weekly for 4 weeks, then increase to 0.5 mg once weekly thereafter.    Follow up in 2-3 months.

## 2024-10-23 NOTE — Patient Instructions (Addendum)
 Start semaglutide  (Wegovy ) for weight loss. Start by injecting 0.25 mg into the skin once weekly for 4 weeks, then increase to 0.5 mg once weekly thereafter.  Please notify me once you have used your last 0.25 mg pen so that I can send the 0.5 mg dose to your pharmacy.  Please update me if you continue to experience abnormal bleeding.  It was a pleasure to see you today!

## 2024-10-23 NOTE — Telephone Encounter (Signed)
 Completed. Please fax office notes from 10/23/24. Thanks!

## 2024-10-24 ENCOUNTER — Other Ambulatory Visit (HOSPITAL_COMMUNITY): Payer: Self-pay

## 2024-10-27 ENCOUNTER — Other Ambulatory Visit (HOSPITAL_COMMUNITY): Payer: Self-pay

## 2024-10-28 ENCOUNTER — Other Ambulatory Visit (HOSPITAL_COMMUNITY): Payer: Self-pay

## 2024-12-05 ENCOUNTER — Ambulatory Visit: Admitting: Primary Care
